# Patient Record
Sex: Female | Born: 1960 | Race: Black or African American | Hispanic: No | Smoking: Never smoker
Health system: Southern US, Community
[De-identification: ages and names within clinical notes are randomized; demographics above are authoritative.]

## PROBLEM LIST (undated history)

## (undated) DIAGNOSIS — I10 Essential (primary) hypertension: Secondary | ICD-10-CM

## (undated) DIAGNOSIS — I509 Heart failure, unspecified: Secondary | ICD-10-CM

## (undated) HISTORY — DX: Heart failure, unspecified: I50.9

## (undated) HISTORY — PX: BREAST BIOPSY: SHX20

---

## 2016-05-31 ENCOUNTER — Ambulatory Visit: Payer: No Typology Code available for payment source

## 2016-05-31 ENCOUNTER — Emergency Department (HOSPITAL_COMMUNITY): Payer: Self-pay

## 2016-05-31 ENCOUNTER — Inpatient Hospital Stay (HOSPITAL_COMMUNITY)
Admission: EM | Admit: 2016-05-31 | Discharge: 2016-06-06 | DRG: 287 | Disposition: A | Discharge status: Home / Self Care | Payer: PRIVATE HEALTH INSURANCE | Attending: Family Medicine | Admitting: Family Medicine

## 2016-05-31 ENCOUNTER — Encounter (HOSPITAL_COMMUNITY): Payer: Self-pay

## 2016-05-31 ENCOUNTER — Ambulatory Visit (INDEPENDENT_AMBULATORY_CARE_PROVIDER_SITE_OTHER): Payer: No Typology Code available for payment source | Admitting: Physician Assistant

## 2016-05-31 VITALS — BP 180/140 | HR 116 | Temp 98.2°F | Resp 17 | Ht 61.0 in | Wt 156.0 lb

## 2016-05-31 DIAGNOSIS — I5041 Acute combined systolic (congestive) and diastolic (congestive) heart failure: Secondary | ICD-10-CM | POA: Diagnosis present

## 2016-05-31 DIAGNOSIS — R9431 Abnormal electrocardiogram [ECG] [EKG]: Secondary | ICD-10-CM | POA: Diagnosis not present

## 2016-05-31 DIAGNOSIS — R030 Elevated blood-pressure reading, without diagnosis of hypertension: Secondary | ICD-10-CM | POA: Diagnosis not present

## 2016-05-31 DIAGNOSIS — I160 Hypertensive urgency: Secondary | ICD-10-CM | POA: Diagnosis present

## 2016-05-31 DIAGNOSIS — I509 Heart failure, unspecified: Secondary | ICD-10-CM

## 2016-05-31 DIAGNOSIS — Z809 Family history of malignant neoplasm, unspecified: Secondary | ICD-10-CM

## 2016-05-31 DIAGNOSIS — R0600 Dyspnea, unspecified: Secondary | ICD-10-CM

## 2016-05-31 DIAGNOSIS — R7302 Impaired glucose tolerance (oral): Secondary | ICD-10-CM

## 2016-05-31 DIAGNOSIS — R0602 Shortness of breath: Secondary | ICD-10-CM | POA: Diagnosis not present

## 2016-05-31 DIAGNOSIS — R000 Tachycardia, unspecified: Secondary | ICD-10-CM | POA: Diagnosis present

## 2016-05-31 DIAGNOSIS — R05 Cough: Secondary | ICD-10-CM

## 2016-05-31 DIAGNOSIS — I313 Pericardial effusion (noninflammatory): Secondary | ICD-10-CM | POA: Diagnosis present

## 2016-05-31 DIAGNOSIS — I5021 Acute systolic (congestive) heart failure: Secondary | ICD-10-CM

## 2016-05-31 DIAGNOSIS — R0609 Other forms of dyspnea: Secondary | ICD-10-CM

## 2016-05-31 DIAGNOSIS — R059 Cough, unspecified: Secondary | ICD-10-CM

## 2016-05-31 DIAGNOSIS — I110 Hypertensive heart disease with heart failure: Principal | ICD-10-CM | POA: Diagnosis present

## 2016-05-31 DIAGNOSIS — I2510 Atherosclerotic heart disease of native coronary artery without angina pectoris: Secondary | ICD-10-CM | POA: Diagnosis present

## 2016-05-31 DIAGNOSIS — I10 Essential (primary) hypertension: Secondary | ICD-10-CM

## 2016-05-31 DIAGNOSIS — D509 Iron deficiency anemia, unspecified: Secondary | ICD-10-CM | POA: Diagnosis present

## 2016-05-31 DIAGNOSIS — Z8249 Family history of ischemic heart disease and other diseases of the circulatory system: Secondary | ICD-10-CM

## 2016-05-31 DIAGNOSIS — Z833 Family history of diabetes mellitus: Secondary | ICD-10-CM

## 2016-05-31 HISTORY — DX: Essential (primary) hypertension: I10

## 2016-05-31 HISTORY — DX: Heart failure, unspecified: I50.9

## 2016-05-31 LAB — CBC
HCT: 38.1 % (ref 36.0–46.0)
Hemoglobin: 11.9 g/dL — ABNORMAL LOW (ref 12.0–15.0)
MCH: 21 pg — ABNORMAL LOW (ref 26.0–34.0)
MCHC: 31.2 g/dL (ref 30.0–36.0)
MCV: 67.3 fL — ABNORMAL LOW (ref 78.0–100.0)
Platelets: 271 10*3/uL (ref 150–400)
RBC: 5.66 MIL/uL — ABNORMAL HIGH (ref 3.87–5.11)
RDW: 16.1 % — ABNORMAL HIGH (ref 11.5–15.5)
WBC: 8.2 10*3/uL (ref 4.0–10.5)

## 2016-05-31 LAB — POCT CBC
Granulocyte percent: 65.5 %G (ref 37–80)
HCT, POC: 37.3 % — AB (ref 37.7–47.9)
Hemoglobin: 12.2 g/dL (ref 12.2–16.2)
Lymph, poc: 2.4 (ref 0.6–3.4)
MCH, POC: 22 pg — AB (ref 27–31.2)
MCHC: 32.8 g/dL (ref 31.8–35.4)
MCV: 67 fL — AB (ref 80–97)
MID (cbc): 0.6 (ref 0–0.9)
MPV: 8.7 fL (ref 0–99.8)
POC Granulocyte: 5.6 (ref 2–6.9)
POC LYMPH PERCENT: 27.7 %L (ref 10–50)
POC MID %: 6.8 %M (ref 0–12)
Platelet Count, POC: 307 10*3/uL (ref 142–424)
RBC: 5.56 M/uL — AB (ref 4.04–5.48)
RDW, POC: 16.4 %
WBC: 8.5 10*3/uL (ref 4.6–10.2)

## 2016-05-31 LAB — BASIC METABOLIC PANEL
Anion gap: 10 (ref 5–15)
BUN: 11 mg/dL (ref 6–20)
CO2: 23 mmol/L (ref 22–32)
Calcium: 9.1 mg/dL (ref 8.9–10.3)
Chloride: 106 mmol/L (ref 101–111)
Creatinine, Ser: 0.82 mg/dL (ref 0.44–1.00)
GFR calc Af Amer: >60 mL/min (ref >60)
GFR calc non Af Amer: >60 mL/min (ref >60)
Glucose, Bld: 95 mg/dL (ref 65–99)
Potassium: 3.8 mmol/L (ref 3.5–5.1)
Sodium: 139 mmol/L (ref 135–145)

## 2016-05-31 LAB — BRAIN NATRIURETIC PEPTIDE: B Natriuretic Peptide: 731.4 pg/mL — ABNORMAL HIGH (ref 0.0–100.0)

## 2016-05-31 LAB — I-STAT TROPONIN, ED: Troponin i, poc: 0.02 ng/mL (ref 0.00–0.08)

## 2016-05-31 LAB — D-DIMER, QUANTITATIVE: D-Dimer, Quant: 1.48 ug/mL-FEU — ABNORMAL HIGH (ref 0.00–0.50)

## 2016-05-31 MED ORDER — IOPAMIDOL (ISOVUE-370) INJECTION 76%
INTRAVENOUS | Status: AC
  Administered 2016-05-31: 70 mL
  Filled 2016-05-31: qty 100

## 2016-05-31 MED ORDER — ASPIRIN 81 MG PO CHEW
324.0000 mg | CHEWABLE_TABLET | Freq: Once | ORAL | Status: DC
  Administered 2016-05-31: 324 mg via ORAL

## 2016-05-31 NOTE — ED Triage Notes (Signed)
Patient here for urgent care for shortness of breath for past 2 weeks.  Patient had elevated BP and HR for EMS.  Last BP was 219/98 HR was 124.  Patient A&Ox4 no acute distress noted.

## 2016-05-31 NOTE — ED Provider Notes (Signed)
Presents with nonproductive cough and dyspnea worse with exertion improved with rest onset 2 weeks ago, gradually, becoming worse over the past one or 2 days. She denies chest pain. Denies orthopnea. No treatment prior to coming here. No other associated symptoms on exam no distress HEENT exam no facial asymmetry neck supple positive JVD lungs Rales at right side posteriorly 1 [address] up. Heart regular rate and rhythm abdomen nontender extremities without edema   Doug Sou, MD 06/01/16 0010

## 2016-05-31 NOTE — Progress Notes (Signed)
Christine Wilson  MRN: [id number] DOB: [birth year]  Subjective:  Christine Wilson is a 55 y.o. female seen in office today for a chief complaint of non productive cough, SOB, and fatigue x 2 weeks. Has associated wheezing. She is also having trouble lying directly on her back but can sleep on her side with one pillow.  Denies any recent illness, pleuritic or exertional chest pain, recent leg swelling, immobilization, hormone therapy, malignancy, and hospitalization.   She had a diagnosis of htn 12 years ago and was on medication for a little bit but notes it was too strong for her so she weaned herself off of it.     Pt does not smoke. She has family history of heart disease. Her mother passed away from an MI at age 57.   Review of Systems  Constitutional: Negative for chills, diaphoresis and fatigue.  HENT: Negative for congestion, ear pain and sinus pressure.   Eyes: Negative for pain.  Respiratory: Positive for wheezing.   Cardiovascular: Negative for chest pain, palpitations and leg swelling.  Genitourinary: Negative for hematuria.  Neurological: Negative for dizziness, weakness, light-headedness, numbness and headaches.    There are no active problems to display for this patient.   No current outpatient prescriptions on file prior to visit.   No current facility-administered medications on file prior to visit.     No Known Allergies     History reviewed. No pertinent past medical history.  Family History  Problem Relation Age of Onset  . Diabetes Mother   . Hypertension Mother   . Heart disease Mother 6565    passed away due to MI  . Hypertension Father   . Diabetes Father   . Diabetes Brother   . Cancer Maternal Grandmother   . Hypertension Maternal Grandmother    Social History   Social History  . Marital status: Single    Spouse name: N/A  . Number of children: N/A  . Years of education: N/A   Occupational History  . Not on file.   Social History Main Topics   . Smoking status: Never Smoker  . Smokeless tobacco: Never Used  . Alcohol use No  . Drug use: No  . Sexual activity: Not on file   Other Topics Concern  . Not on file   Social History Narrative  . No narrative on file    Objective:  BP (!) 180/140 (BP Location: Left Arm, Cuff Size: Normal)   Pulse (!) 116   Temp 98.2 F (36.8 C) (Oral)   Resp 17   Ht 5\' 1"  (1.549 m)   Wt 156 lb (70.8 kg)   SpO2 99%   BMI 29.48 kg/m   Physical Exam  Constitutional: She is oriented to person, place, and time. She appears distressed (mild).  HENT:  Head: Normocephalic and atraumatic.  Eyes: Conjunctivae are normal.  Neck: Normal range of motion.  Cardiovascular: Regular rhythm, normal heart sounds and intact distal pulses.  Tachycardia present.   Pulmonary/Chest: Effort normal. She has rales in the right lower field.  Musculoskeletal:       Right lower leg: Normal. She exhibits no swelling.       Left lower leg: Normal. She exhibits no swelling.  Neurological: She is alert and oriented to person, place, and time. Gait normal.  Skin: Skin is warm and dry.  Psychiatric: Affect normal.  Vitals reviewed.    Results for orders placed or performed in visit on 05/31/16 (from the past 24 hour(s))  POCT CBC     Status: Abnormal   Collection Time: 05/31/16  5:35 PM  Result Value Ref Range   WBC 8.5 4.6 - 10.2 K/uL   Lymph, poc 2.4 0.6 - 3.4   POC LYMPH PERCENT 27.7 10 - 50 %L   MID (cbc) 0.6 0 - 0.9   POC MID % 6.8 0 - 12 %M   POC Granulocyte 5.6 2 - 6.9   Granulocyte percent 65.5 37 - 80 %G   RBC 5.56 (A) 4.04 - 5.48 M/uL   Hemoglobin 12.2 12.2 - 16.2 g/dL   HCT, POC 16.1 (A) 09.6 - 47.9 %   MCV 67.0 (A) 80 - 97 fL   MCH, POC 22.0 (A) 27 - 31.2 pg   MCHC 32.8 31.8 - 35.4 g/dL   RDW, POC 04.5 %   Platelet Count, POC 307 142 - 424 K/uL   MPV 8.7 0 - 99.8 fL   EKG shows tachycardia at 113 bpm with ST segment depression in leads V5 and V6. There was atrial enlargement in the inferior  leads. Large depolarization in septal leads (V1-V3). There is no prior EKG for comparison. EKG findings presented to Dr. Gwendolyn Grant.   Assessment and Plan :  This case was precepted with Dr. Gwendolyn Grant. In favor of expediting pt's care, the workup was held here once EKG was done. EMS was contacted. Patient was transferred to [facility] ER for further evaluation and management of care.  1. Cough - EKG 12-Lead - DG Chest 2 View; Future  2. Shortness of breath - EKG 12-Lead - DG Chest 2 View; Future  3. Elevated blood pressure reading - POCT CBC - COMPLETE METABOLIC PANEL WITH GFR - TSH - Lipid panel  4. Abnormal EKG  - aspirin chewable tablet 324 mg; Chew 4 tablets (324 mg total) by mouth once.  Benjiman Core PA-C  [facility] 05/31/2016 6:11 PM

## 2016-05-31 NOTE — ED Provider Notes (Signed)
[facility]-EMERGENCY DEPT Provider Note   CSN: [id number] Arrival date & time: 05/31/16  1841     History   Chief Complaint Chief Complaint  Patient presents with  . Shortness of Breath    HPI Christine Wilson is a 55 y.o. female.  HPI   55 year old female with history of hypertension and here from urgent care for evaluation of shortness of breath. Patient reports gradual onset of shortness of breath and nonproductive cough ongoing for the past 2-3 weeks. Shortness of breath is more noticeable with exertion especially if she walks from the parking lot to the apartment on the second floor. She reported having tightness in her chest. Report mild congestion. She also complaining of bilateral leg cramping for the same duration. She denies having associated fever, headache, lightheadedness, chest pain, nausea, vomiting, diarrhea, diaphoresis, back pain or abdominal pain. She denies any increased fluid gain. She is a nonsmoker and no history of diabetes. She was diagnosed with hypertension 13 years ago but does not take any blood pressure medication. She denies any recent sick contact. She denies any prior history of PE or DVT, no recent surgery, prolonged bed rest, unilateral leg swelling or calf pain, active cancer or hemoptysis. She is not on any oral hormone. She does not have a parenchymal provider. She has been using over-the-counter medication such as Mucinex, vitamin C and E, garlic by mouth, as well as potassium supplement. These medications have not provided any relief. Otherwise she does not take any medication on a regular basis. Patient sleeps with one pillow.  Past Medical History:  Diagnosis Date  . Hypertension     There are no active problems to display for this patient.   Past Surgical History:  Procedure Laterality Date  . CESAREAN SECTION      OB History    No data available       Home Medications    Prior to Admission medications   Medication Sig Start Date End Date  Taking? Authorizing Provider  BIOTIN FORTE PO Take by mouth daily.    Historical Provider, MD  cholecalciferol (VITAMIN D) 1000 units tablet Take 1,000 Units by mouth daily.    Historical Provider, MD  Garlic 10 MG CAPS Take by mouth daily.    Historical Provider, MD  Multiple Vitamins-Minerals (MULTIVITAMIN WITH MINERALS) tablet Take 1 tablet by mouth daily.    Historical Provider, MD  vitamin E 100 UNIT capsule Take by mouth daily.    Historical Provider, MD    Family History Family History  Problem Relation Age of Onset  . Diabetes Mother   . Hypertension Mother   . Heart disease Mother 92    passed away due to MI  . Hypertension Father   . Diabetes Father   . Diabetes Brother   . Cancer Maternal Grandmother   . Hypertension Maternal Grandmother     Social History Social History  Substance Use Topics  . Smoking status: Never Smoker  . Smokeless tobacco: Never Used  . Alcohol use No     Allergies   Patient has no known allergies.   Review of Systems Review of Systems  All other systems reviewed and are negative.    Physical Exam Updated Vital Signs There were no vitals taken for this visit.  Physical Exam  Constitutional: She is oriented to person, place, and time. She appears well-developed and well-nourished. No distress.  HENT:  Head: Atraumatic.  Eyes: Conjunctivae are normal.  Neck: Neck supple. No JVD present.  Cardiovascular: Intact distal pulses.  Exam reveals no gallop and no friction rub.   No murmur heard. Tachycardia without murmurs or gallops  Pulmonary/Chest: She has rales (Rales heard at base of lungs. No wheezes or rhonchi).  Abdominal: Soft. There is no tenderness.  Musculoskeletal: She exhibits no edema.  Bilateral lower extremities are without palpable cords, erythema, or edema.  Neurological: She is alert and oriented to person, place, and time. She has normal strength. No cranial nerve deficit or sensory deficit. GCS eye subscore is 4.  GCS verbal subscore is 5. GCS motor subscore is 6.  Skin: No rash noted.  Psychiatric: She has a normal mood and affect.  Nursing note and vitals reviewed.    ED Treatments / Results  Labs (all labs ordered are listed, but only abnormal results are displayed) Labs Reviewed - No data to display  EKG  EKG Interpretation  Date/Time:  Thursday May 31 2016 19:16:04 EST Ventricular Rate:  111 PR Interval:    QRS Duration: 90 QT Interval:  313 QTC Calculation: 426 R Axis:   53 Text Interpretation:  Sinus tachycardia Biatrial enlargement LVH with secondary repolarization abnormality No old tracing to compare Confirmed by Ethelda ChickJACUBOWITZ  MD, SAM [phone]([id number]) on 05/31/2016 7:24:34 PM       Radiology Dg Chest 2 View  Result Date: 05/31/2016 CLINICAL DATA:  Cough and dyspnea for 2 weeks EXAM: CHEST  2 VIEW COMPARISON:  None. FINDINGS: The heart is enlarged. There is aortic atherosclerosis. There is mild diffuse interstitial prominence with minimal peribronchial thickening suggestive of small airway inflammation/ bronchitic change. No pneumonic consolidation, effusion or pulmonary edema. No suspicious osseous lesions. IMPRESSION: Findings suggest mild bronchitic change of the lungs without pneumonic consolidation, CHF, effusion or pneumothorax. Electronically Signed   By: Tollie Ethavid  Kwon M.D.   On: 05/31/2016 20:03    Procedures Procedures (including critical care time)  Medications Ordered in ED Medications - No data to display   Initial Impression / Assessment and Plan / ED Course  I have reviewed the triage vital signs and the nursing notes.  Pertinent labs & imaging results that were available during my care of the patient were reviewed by me and considered in my medical decision making (see chart for details).  Clinical Course     BP (!) 196/108   Pulse 112   Resp 25   SpO2 92% Comment: While ambulating   Final Clinical Impressions(s) / ED Diagnoses   Final diagnoses:  None     New Prescriptions New Prescriptions   No medications on file   7:11 PM Patient here with non-productive cough and shortness of breath. She was found to be hypertensive with systolic blood pressure 220. She is tachycardic with heart rates in the 1 teens. She has crackles in the lung base. She does not have JVD or peripheral edema. She is not on any ACE inhibitor. Workup initiated. Care discussed with Dr. Ethelda ChickJacubowitz.   8:09 PM Sign out to oncoming provider who will f/u on pt's labs, manage BP and determine disposition.    Fayrene HelperBowie Luther Newhouse, PA-C 05/31/16 2016    Doug SouSam Jacubowitz, MD 06/01/16 0010

## 2016-06-01 ENCOUNTER — Observation Stay (HOSPITAL_COMMUNITY): Payer: Self-pay

## 2016-06-01 ENCOUNTER — Encounter (HOSPITAL_COMMUNITY): Payer: Self-pay | Admitting: Family Medicine

## 2016-06-01 DIAGNOSIS — R0609 Other forms of dyspnea: Secondary | ICD-10-CM

## 2016-06-01 DIAGNOSIS — I509 Heart failure, unspecified: Secondary | ICD-10-CM | POA: Diagnosis not present

## 2016-06-01 DIAGNOSIS — R0600 Dyspnea, unspecified: Secondary | ICD-10-CM

## 2016-06-01 DIAGNOSIS — I160 Hypertensive urgency: Secondary | ICD-10-CM | POA: Diagnosis not present

## 2016-06-01 DIAGNOSIS — I10 Essential (primary) hypertension: Secondary | ICD-10-CM | POA: Diagnosis not present

## 2016-06-01 DIAGNOSIS — D509 Iron deficiency anemia, unspecified: Secondary | ICD-10-CM | POA: Diagnosis not present

## 2016-06-01 DIAGNOSIS — R7302 Impaired glucose tolerance (oral): Secondary | ICD-10-CM | POA: Diagnosis not present

## 2016-06-01 DIAGNOSIS — I5041 Acute combined systolic (congestive) and diastolic (congestive) heart failure: Secondary | ICD-10-CM | POA: Diagnosis not present

## 2016-06-01 HISTORY — DX: Other forms of dyspnea: R06.09

## 2016-06-01 LAB — BASIC METABOLIC PANEL
Anion gap: 12 (ref 5–15)
BUN: 11 mg/dL (ref 6–20)
CO2: 27 mmol/L (ref 22–32)
Calcium: 9.6 mg/dL (ref 8.9–10.3)
Chloride: 102 mmol/L (ref 101–111)
Creatinine, Ser: 0.97 mg/dL (ref 0.44–1.00)
GFR calc Af Amer: >60 mL/min (ref >60)
GFR calc non Af Amer: >60 mL/min (ref >60)
Glucose, Bld: 148 mg/dL — ABNORMAL HIGH (ref 65–99)
Potassium: 3.7 mmol/L (ref 3.5–5.1)
Sodium: 141 mmol/L (ref 135–145)

## 2016-06-01 LAB — LIPID PANEL
Cholesterol: 173 mg/dL (ref <200)
HDL: 54 mg/dL (ref >50)
LDL Cholesterol: 83 mg/dL
Total CHOL/HDL Ratio: 3.2 Ratio (ref <5.0)
Triglycerides: 181 mg/dL — ABNORMAL HIGH (ref <150)
VLDL: 36 mg/dL — ABNORMAL HIGH (ref <30)

## 2016-06-01 LAB — COMPLETE METABOLIC PANEL WITH GFR
ALT: 52 U/L — ABNORMAL HIGH (ref 6–29)
AST: 40 U/L — ABNORMAL HIGH (ref 10–35)
Albumin: 4.2 g/dL (ref 3.6–5.1)
Alkaline Phosphatase: 77 U/L (ref 33–130)
BUN: 13 mg/dL (ref 7–25)
CO2: 24 mmol/L (ref 20–31)
Calcium: 9.4 mg/dL (ref 8.6–10.4)
Chloride: 106 mmol/L (ref 98–110)
Creat: 0.79 mg/dL (ref 0.50–1.05)
GFR, Est African American: >89 mL/min (ref >=60)
GFR, Est Non African American: 84 mL/min (ref >=60)
Glucose, Bld: 123 mg/dL — ABNORMAL HIGH (ref 65–99)
Potassium: 4.2 mmol/L (ref 3.5–5.3)
Sodium: 141 mmol/L (ref 135–146)
Total Bilirubin: 0.8 mg/dL (ref 0.2–1.2)
Total Protein: 7.1 g/dL (ref 6.1–8.1)

## 2016-06-01 LAB — IRON AND TIBC
Iron: 76 ug/dL (ref 28–170)
Saturation Ratios: 14 % (ref 10.4–31.8)
TIBC: 525 ug/dL — ABNORMAL HIGH (ref 250–450)
UIBC: 449 ug/dL

## 2016-06-01 LAB — FERRITIN: Ferritin: 85 ng/mL (ref 11–307)

## 2016-06-01 LAB — MAGNESIUM: Magnesium: 1.9 mg/dL (ref 1.7–2.4)

## 2016-06-01 LAB — TSH: TSH: 2.67 mIU/L

## 2016-06-01 MED ORDER — POTASSIUM CHLORIDE CRYS ER 20 MEQ PO TBCR
20.0000 meq | EXTENDED_RELEASE_TABLET | Freq: Every day | ORAL | Status: DC
  Administered 2016-06-02 – 2016-06-03 (×2): 20 meq via ORAL
  Filled 2016-06-01 (×2): qty 1

## 2016-06-01 MED ORDER — NITROGLYCERIN 2 % TD OINT
1.0000 [in_us] | TOPICAL_OINTMENT | Freq: Once | TRANSDERMAL | Status: AC
  Administered 2016-06-01: 1 [in_us] via TOPICAL
  Filled 2016-06-01: qty 1

## 2016-06-01 MED ORDER — ONDANSETRON HCL 4 MG/2ML IJ SOLN: 4.0000 mg | Freq: Four times a day (QID) | INTRAMUSCULAR | Status: DC | PRN

## 2016-06-01 MED ORDER — ENOXAPARIN SODIUM 40 MG/0.4ML ~~LOC~~ SOLN
40.0000 mg | SUBCUTANEOUS | Status: DC
  Administered 2016-06-01 – 2016-06-03 (×3): 40 mg via SUBCUTANEOUS
  Filled 2016-06-01 (×3): qty 0.4

## 2016-06-01 MED ORDER — FUROSEMIDE 10 MG/ML IJ SOLN
40.0000 mg | Freq: Two times a day (BID) | INTRAMUSCULAR | Status: DC
  Administered 2016-06-01 – 2016-06-03 (×5): 40 mg via INTRAVENOUS
  Filled 2016-06-01 (×5): qty 4

## 2016-06-01 MED ORDER — ACETAMINOPHEN 325 MG PO TABS: 650.0000 mg | ORAL_TABLET | ORAL | Status: DC | PRN

## 2016-06-01 MED ORDER — AMLODIPINE BESYLATE 5 MG PO TABS
5.0000 mg | ORAL_TABLET | Freq: Every day | ORAL | Status: DC
  Administered 2016-06-01 – 2016-06-02 (×2): 5 mg via ORAL
  Filled 2016-06-01 (×2): qty 1

## 2016-06-01 MED ORDER — SODIUM CHLORIDE 0.9% FLUSH: 3.0000 mL | INTRAVENOUS | Status: DC | PRN

## 2016-06-01 MED ORDER — ADULT MULTIVITAMIN W/MINERALS CH
1.0000 | ORAL_TABLET | Freq: Every day | ORAL | Status: DC
  Administered 2016-06-01 – 2016-06-06 (×5): 1 via ORAL
  Filled 2016-06-01 (×5): qty 1

## 2016-06-01 MED ORDER — CARVEDILOL 3.125 MG PO TABS: 3.1250 mg | ORAL_TABLET | Freq: Two times a day (BID) | ORAL | Status: DC

## 2016-06-01 MED ORDER — POTASSIUM CHLORIDE CRYS ER 20 MEQ PO TBCR
20.0000 meq | EXTENDED_RELEASE_TABLET | Freq: Once | ORAL | Status: AC
  Administered 2016-06-01: 20 meq via ORAL
  Filled 2016-06-01: qty 1

## 2016-06-01 MED ORDER — SODIUM CHLORIDE 0.9 % IV SOLN
250.0000 mL | INTRAVENOUS | Status: DC | PRN
  Administered 2016-06-01: 250 mL via INTRAVENOUS

## 2016-06-01 MED ORDER — CARVEDILOL 6.25 MG PO TABS
6.2500 mg | ORAL_TABLET | Freq: Two times a day (BID) | ORAL | Status: DC
  Administered 2016-06-01: 6.25 mg via ORAL
  Filled 2016-06-01: qty 1

## 2016-06-01 MED ORDER — ALPRAZOLAM 0.25 MG PO TABS: 0.2500 mg | ORAL_TABLET | Freq: Two times a day (BID) | ORAL | Status: DC | PRN

## 2016-06-01 MED ORDER — HYDRALAZINE HCL 20 MG/ML IJ SOLN
5.0000 mg | INTRAMUSCULAR | Status: DC | PRN
  Administered 2016-06-01: 5 mg via INTRAVENOUS
  Filled 2016-06-01: qty 1

## 2016-06-01 MED ORDER — FUROSEMIDE 10 MG/ML IJ SOLN
40.0000 mg | INTRAMUSCULAR | Status: AC
  Administered 2016-06-01: 40 mg via INTRAVENOUS
  Filled 2016-06-01: qty 4

## 2016-06-01 MED ORDER — HYDRALAZINE HCL 20 MG/ML IJ SOLN
10.0000 mg | INTRAMUSCULAR | Status: DC | PRN
  Administered 2016-06-01: 10 mg via INTRAVENOUS
  Filled 2016-06-01: qty 1

## 2016-06-01 MED ORDER — SODIUM CHLORIDE 0.9 % IV SOLN
125.0000 mg | Freq: Once | INTRAVENOUS | Status: AC
  Administered 2016-06-01: 125 mg via INTRAVENOUS
  Filled 2016-06-01: qty 10

## 2016-06-01 MED ORDER — SODIUM CHLORIDE 0.9% FLUSH
3.0000 mL | Freq: Two times a day (BID) | INTRAVENOUS | Status: DC
  Administered 2016-06-01 – 2016-06-05 (×9): 3 mL via INTRAVENOUS

## 2016-06-01 MED ORDER — VITAMIN D 1000 UNITS PO TABS
1000.0000 [IU] | ORAL_TABLET | Freq: Every day | ORAL | Status: DC
  Administered 2016-06-01 – 2016-06-06 (×5): 1000 [IU] via ORAL
  Filled 2016-06-01 (×5): qty 1

## 2016-06-01 MED ORDER — FERROUS SULFATE 325 (65 FE) MG PO TABS: 325.0000 mg | ORAL_TABLET | Freq: Every day | ORAL | Status: DC

## 2016-06-01 MED ORDER — POTASSIUM CHLORIDE CRYS ER 10 MEQ PO TBCR
10.0000 meq | EXTENDED_RELEASE_TABLET | Freq: Two times a day (BID) | ORAL | Status: DC
  Administered 2016-06-01 (×2): 10 meq via ORAL
  Filled 2016-06-01 (×2): qty 1

## 2016-06-01 NOTE — Care Management Note (Signed)
Case Management Note  Patient Details  Name: [name] MRN: [id number] Date of Birth: 1960-10-26  Subjective/Objective:          Admitted with CHF          Action/Plan: Patient moved from [place], [place] to [place] 3 yrs ago; No PCP; has private insurance with Ambulance person with prescription drug plan; pharmacy of choice is [facility]. Patient is independent of her ADL's, works full time.  She has scales at home, CM instructed pt to weigh herself daily. She eats a heart healthy diet, CM talked to the patient at length about monitoring her sodium in her diet. Follow up apt made with [facility] for primary care with Alysia Penna NP for Jun 29, 2016 at 11:30 am. CM will continue to follow for DCP.  Expected Discharge Date:  06/02/16               Expected Discharge Plan:  Home/Self Care   Discharge planning Services  CM Consult, Follow-up appt scheduled     Status of Service:  In process, will continue to follow  Reola Mosher [phone] 06/01/2016, 10:58 AM

## 2016-06-01 NOTE — Progress Notes (Signed)
B/P = 208/120, Pulse = 125 this afternoon.  Patient given 1st doses of Coreg 6.25mg  po and Norvasc 5 mg po now.  Dose of Lasix 40mg  IV given as scheduled.  VSs reviewed with Dr. Arbutus Leas.  Will recheck B/P for effectiveness of new heart medications this afternoon.

## 2016-06-01 NOTE — Plan of Care (Signed)
Problem: Cardiac: Goal: Ability to achieve and maintain adequate cardiopulmonary perfusion will improve Outcome: Progressing 2D Echo ordered  Problem: Education: Goal: Ability to demonstrate managment of disease process will improve Outcome: Progressing "Living Better with Heart Failure" booklet given and reviewed.  Discussed booklet information with patient's daughter and nephew per patient's request.  Patient saw HF video this morning.

## 2016-06-01 NOTE — Progress Notes (Signed)
Patient's B/P 1 hour after new heart medications = 152/88 and pulse = 94

## 2016-06-01 NOTE — ED Provider Notes (Signed)
12:10 AM Patient care assumed from Fayrene HelperBowie Tran, PA-C at shift change. Patient presenting for progressive 2 weeks of shortness of breath. She has been off of antihypertensive medications for 13 years and reports not seeing a primary care physician for the last 5 years. Patient is afebrile and without leukocytosis. Low suspicion for infectious etiology. A d-dimer was ordered which was elevated. Subsequent CT scan shows no evidence of pulmonary embolus; however, there is evidence of cardiac enlargement with pleural effusions and edema consistent with CHF. Elevated BNP supports diagnosis of acute CHF exacerbation. Patient has remained hypertensive while in the emergency department. Most recent blood pressure 205/120. Nitro paste has been ordered as well as an initial dose of Lasix. Results have been reviewed with the patient in their entirety. She is amenable to admission. Will consult with [facility] for admission and further management, likely echo in the morning.  12:30 AM Case discussed with Dr. Antionette Charpyd who will admit. Temporary admission orders placed.   Results for orders placed or performed during the hospital encounter of 05/31/16  Basic metabolic panel  Result Value Ref Range   Sodium 139 135 - 145 mmol/L   Potassium 3.8 3.5 - 5.1 mmol/L   Chloride 106 101 - 111 mmol/L   CO2 23 22 - 32 mmol/L   Glucose, Bld 95 65 - 99 mg/dL   BUN 11 6 - 20 mg/dL   Creatinine, Ser 6.570.82 0.44 - 1.00 mg/dL   Calcium 9.1 8.9 - 84.610.3 mg/dL   GFR calc non Af Amer >60 >60 mL/min   GFR calc Af Amer >60 >60 mL/min   Anion gap 10 5 - 15  CBC  Result Value Ref Range   WBC 8.2 4.0 - 10.5 K/uL   RBC 5.66 (H) 3.87 - 5.11 MIL/uL   Hemoglobin 11.9 (L) 12.0 - 15.0 g/dL   HCT 96.238.1 95.236.0 - 84.146.0 %   MCV 67.3 (L) 78.0 - 100.0 fL   MCH 21.0 (L) 26.0 - 34.0 pg   MCHC 31.2 30.0 - 36.0 g/dL   RDW 32.416.1 (H) 40.111.5 - 02.715.5 %   Platelets 271 150 - 400 K/uL  D-[facility]er, quantitative ([facility])  Result Value Ref Range   D-Dimer, Quant 1.48  (H) 0.00 - 0.50 ug/mL-FEU  Brain natriuretic peptide  Result Value Ref Range   B Natriuretic Peptide 731.4 (H) 0.0 - 100.0 pg/mL  I-stat troponin, ED  Result Value Ref Range   Troponin i, poc 0.02 0.00 - 0.08 ng/mL   Comment 3           Dg Chest 2 View  Result Date: 05/31/2016 CLINICAL DATA:  Cough and dyspnea for 2 weeks EXAM: CHEST  2 VIEW COMPARISON:  None. FINDINGS: The heart is enlarged. There is aortic atherosclerosis. There is mild diffuse interstitial prominence with minimal peribronchial thickening suggestive of small airway inflammation/ bronchitic change. No pneumonic consolidation, effusion or pulmonary edema. No suspicious osseous lesions. IMPRESSION: Findings suggest mild bronchitic change of the lungs without pneumonic consolidation, CHF, effusion or pneumothorax. Electronically Signed   By: Tollie Ethavid  Kwon M.D.   On: 05/31/2016 20:03   Ct Angio Chest Pe W And/or Wo Contrast  Result Date: 05/31/2016 CLINICAL DATA:  Shortness of breath for 3 weeks. No chest pain. Nonsmoker. EXAM: CT ANGIOGRAPHY CHEST WITH CONTRAST TECHNIQUE: Multidetector CT imaging of the chest was performed using the standard protocol during bolus administration of intravenous contrast. Multiplanar CT image reconstructions and MIPs were obtained to evaluate the vascular anatomy. CONTRAST:  70 mL Isovue 370 COMPARISON:  None. FINDINGS: Cardiovascular: Technically adequate study with good opacification of the central and segmental pulmonary arteries. No focal filling defects. No evidence of significant pulmonary embolus. Diffuse cardiac enlargement. Small pericardial effusion. Normal caliber thoracic aorta. Mediastinum/Nodes: Mediastinal lymph nodes are mildly prominent diffusely without pathologic enlargement, likely reactive. Esophagus is decompressed. Small esophageal hiatal hernia. Lungs/Pleura: Small bilateral pleural effusions with basilar atelectasis, greater on the right. Diffuse interstitial pattern to the lungs  consistent with interstitial edema. Diffuse bronchial wall thickening consistent with acute or chronic bronchitis. Airways are patent. No pneumothorax. Upper Abdomen: No acute abnormality. Musculoskeletal: Degenerative changes in the spine. No destructive bone lesions. Review of the MIP images confirms the above findings. IMPRESSION: No evidence of significant pulmonary embolus. Cardiac enlargement with bilateral pleural effusions and diffuse interstitial edema consistent with congestive failure. Bronchial wall thickening may indicate acute or chronic bronchitis. Electronically Signed   By: Burman Nieves M.D.   On: 05/31/2016 23:56      Antony Madura, PA-C 06/01/16 0031    Doug Sou, MD 06/01/16 [phone]

## 2016-06-01 NOTE — Consult Note (Signed)
Cardiology Consult    Patient ID: Suhaila Troiano MRN: [id number], DOB/AGE: November 07, 1960   Admit date: 05/31/2016 Date of Consult: 06/01/2016  Primary Physician: No PCP Per Patient Reason for Consult: CHF Primary Cardiologist: New - Dr. Excell Seltzer Requesting Provider: Dr. Arbutus Leas   History of Present Illness    Darothy Courtright is a 55 y.o. female with past medical history of HTN who initially presented to Urgent Care on 05/31/2016 for evaluation of a dry cough, dyspnea, and fatigue for the past two weeks who was referred to [facility] for further treatment.  The patient reports having worsening dyspnea with exertion and a dry cough for the past 3-4 weeks. Notes this when walking from her car to the house or climbing stairs. Denies any associated chest discomfort. Does feel occasional palpitations when her breathing is labored. Reports an associated dry cough as well. Denies any orthopnea, PND, or lower extremity edema.   She has not been seen by a PCP in 5+ years. Denies any history of HTN, HLD, or Type 2 DM. No personal cardiac history. Her mother did have CAD and passed away from a massive MI at age 57. No tobacco use, alcohol use, or recreational drug use.   Initial BP was found to be elevated to 205/120. Labs show a WBC of 8.2, Hgb 11.9, platelets 271. K+ 3.8. Creatinine 0.82. D-dimer 1.48. Initial troponin negative. BNP 731. CXR showing mild bronchitic changes of the lungs without consolidation, CHF, or effusion. CTA without evidence of significant pulmonary embolus. Cardiac enlargement with bilateral pleural effusions and diffuse interstitial edema consistent with congestive failure was noted on this study. EKG shows sinus tachycardia, HR 111, with ST depression in lateral leads likely secondary to repolarization abnormality in the setting of LVH.   Has been started on IV Lasix 40mg  BID with a net output of -1.3L thus far. Has been started on Coreg 3.125mg  BID along with receiving PRN IV  Hydralazine with BP remaining elevated to 179/87 on the most recent check.    Past Medical History   Past Medical History:  Diagnosis Date  . Hypertension     Past Surgical History:  Procedure Laterality Date  . CESAREAN SECTION       Allergies  No Known Allergies  Inpatient Medications    . carvedilol  3.125 mg Oral BID WC  . cholecalciferol  1,000 Units Oral Daily  . enoxaparin (LOVENOX) injection  40 mg Subcutaneous Q24H  . [START ON 06/02/2016] ferrous sulfate  325 mg Oral Q breakfast  . furosemide  40 mg Intravenous BID  . multivitamin with minerals  1 tablet Oral Daily  . [START ON 06/02/2016] potassium chloride  20 mEq Oral Daily  . sodium chloride flush  3 mL Intravenous Q12H    Family History    Family History  Problem Relation Age of Onset  . Diabetes Mother   . Hypertension Mother   . Heart disease Mother 3    a. Passed away due to MI  . Hypertension Father   . Diabetes Father   . Diabetes Brother   . Cancer Maternal Grandmother   . Hypertension Maternal Grandmother     Social History    Social History   Social History  . Marital status: Divorced    Spouse name: N/A  . Number of children: N/A  . Years of education: N/A   Occupational History  . Not on file.   Social History Main Topics  . Smoking status: Never Smoker  .  Smokeless tobacco: Never Used  . Alcohol use No  . Drug use: No  . Sexual activity: Not on file   Other Topics Concern  . Not on file   Social History Narrative  . No narrative on file     Review of Systems    General:  No chills, fever, night sweats or weight changes.  Cardiovascular:  No chest pain, edema, orthopnea,  paroxysmal nocturnal dyspnea.  Positive for dyspnea with exertion and palpitations.  Dermatological: No rash, lesions/masses Respiratory: Positive for dry cough and dyspnea. Urologic: No hematuria, dysuria Abdominal:   No nausea, vomiting, diarrhea, bright red blood per rectum, melena, or  hematemesis Neurologic:  No visual changes, wkns, changes in mental status. All other systems reviewed and are otherwise negative except as noted above.  Physical Exam    Blood pressure (!) 179/87, pulse 100, temperature 98.6 F (37 C), temperature source Oral, resp. rate (!) 24, height 5\' 1"  (1.549 m), weight 147 lb 14.4 oz (67.1 kg), SpO2 99 %.  General: Pleasant, African American female appearing in NAD Psych: Normal affect. Neuro: Alert and oriented X 3. Moves all extremities spontaneously. HEENT: Normal  Neck: Supple without bruits. JVD at 9cm. Lungs:  Resp regular and unlabored, decreased breath sounds at bases bilaterally. Heart: Regular rhythm, tachycardiac rate, no s3, s4, or murmurs. Abdomen: Soft, non-tender, non-distended, BS + x 4.  Extremities: No clubbing, cyanosis or edema. DP/PT/Radials 2+ and equal bilaterally.  Labs    Troponin [facility](Point of Care Test)  Recent Labs  05/31/16 2005  TROPIPOC 0.02   No results for input(s): CKTOTAL, CKMB, TROPONINI in the last 72 hours. Lab Results  Component Value Date   WBC 8.2 05/31/2016   HGB 11.9 (L) 05/31/2016   HCT 38.1 05/31/2016   MCV 67.3 (L) 05/31/2016   PLT 271 05/31/2016    Recent Labs Lab 05/31/16 1726  06/01/16 0229  NA 141  < > 141  K 4.2  < > 3.7  CL 106  < > 102  CO2 24  < > 27  BUN 13  < > 11  CREATININE 0.79  < > 0.97  CALCIUM 9.4  < > 9.6  PROT 7.1  --   --   BILITOT 0.8  --   --   ALKPHOS 77  --   --   ALT 52*  --   --   AST 40*  --   --   GLUCOSE 123*  < > 148*  < > = values in this interval not displayed. Lab Results  Component Value Date   CHOL 173 05/31/2016   HDL 54 05/31/2016   LDLCALC 83 05/31/2016   TRIG 181 (H) 05/31/2016   Lab Results  Component Value Date   DDIMER 1.48 (H) 05/31/2016     Radiology Studies    Dg Chest 2 View  Result Date: 05/31/2016 CLINICAL DATA:  Cough and dyspnea for 2 weeks EXAM: CHEST  2 VIEW COMPARISON:  None. FINDINGS: The heart is enlarged. There  is aortic atherosclerosis. There is mild diffuse interstitial prominence with minimal peribronchial thickening suggestive of small airway inflammation/ bronchitic change. No pneumonic consolidation, effusion or pulmonary edema. No suspicious osseous lesions. IMPRESSION: Findings suggest mild bronchitic change of the lungs without pneumonic consolidation, CHF, effusion or pneumothorax. Electronically Signed   By: Tollie Ethavid  Kwon M.D.   On: 05/31/2016 20:03   Ct Angio Chest Pe W And/or Wo Contrast  Result Date: 05/31/2016 CLINICAL DATA:  Shortness of breath  for 3 weeks. No chest pain. Nonsmoker. EXAM: CT ANGIOGRAPHY CHEST WITH CONTRAST TECHNIQUE: Multidetector CT imaging of the chest was performed using the standard protocol during bolus administration of intravenous contrast. Multiplanar CT image reconstructions and MIPs were obtained to evaluate the vascular anatomy. CONTRAST:  70 mL Isovue 370 COMPARISON:  None. FINDINGS: Cardiovascular: Technically adequate study with good opacification of the central and segmental pulmonary arteries. No focal filling defects. No evidence of significant pulmonary embolus. Diffuse cardiac enlargement. Small pericardial effusion. Normal caliber thoracic aorta. Mediastinum/Nodes: Mediastinal lymph nodes are mildly prominent diffusely without pathologic enlargement, likely reactive. Esophagus is decompressed. Small esophageal hiatal hernia. Lungs/Pleura: Small bilateral pleural effusions with basilar atelectasis, greater on the right. Diffuse interstitial pattern to the lungs consistent with interstitial edema. Diffuse bronchial wall thickening consistent with acute or chronic bronchitis. Airways are patent. No pneumothorax. Upper Abdomen: No acute abnormality. Musculoskeletal: Degenerative changes in the spine. No destructive bone lesions. Review of the MIP images confirms the above findings. IMPRESSION: No evidence of significant pulmonary embolus. Cardiac enlargement with bilateral  pleural effusions and diffuse interstitial edema consistent with congestive failure. Bronchial wall thickening may indicate acute or chronic bronchitis. Electronically Signed   By: Burman Nieves M.D.   On: 05/31/2016 23:56    EKG & Cardiac Imaging    EKG: Sinus tachycardia, HR 111, with ST depression in lateral leads likely secondary to repolarization abnormality in the setting of LVH. No prior tracings available for comparison.  Echocardiogram: Pending  Assessment & Plan    1. Acute CHF Exacerbation/ Dyspnea with Exertion - reports progressive dyspnea with exertion, a dry cough, and palpitations for the past 3-4 weeks. Denies any chest pain, PND, orthopnea, or lower extremity edema. No prior cardiac history but does have a family history of CAD with her mother having a fatal MI at age 31. Lipid Panel shows total cholesterol 173 and LDL 83. - BNP elevated to 694. CTA without evidence of significant pulmonary embolus but cardiac enlargement with bilateral pleural effusions and diffuse interstitial edema was noted. EKG shows sinus tachycardia, HR 111, with ST depression in lateral leads likely secondary to repolarization abnormality in the setting of LVH.  - started on IV Lasix 40mg  BID with net output -1.3L. Still has rales on exam, therefore will continue with IV Lasix. Has been started on BB therapy. - echocardiogram is pending to assess LV function and wall motion. Consider ischemic evaluation if dyspnea continues despite diuresis or if EF is significantly reduced.   2. Hypertensive Urgency - Initial BP was found to be elevated to 205/120. Started on Coreg 3.125mg  BID and receiving PRN Hydralazine with current BP of 179/87. - will titrate Coreg to 6.25mg  BID. Will add Amlodipine 5mg  daily with titration to 10mg  daily if needed.   3. Sinus Tachycardia - HR remaining elevated into the 110's - 120's. - started on Coreg 3.125mg  BID. Will titrate to 6.25mg  BID.   Signed, Ellsworth Lennox,  PA-C 06/01/2016, 2:33 PM Pager: [phone]  Patient seen, examined. Available data reviewed. Agree with findings, assessment, and plan as outlined by Randall An, PA-C. On examination, the patient is alert and oriented, pleasant woman in no distress. JVP is mildly elevated. Lung fields are clear. Heart is tachycardic and regular without murmur or gallop. The apex is discrete and nondisplaced. The abdomen is soft and nontender without organomegaly. Lower extremities show no edema. The skin is warm and dry with no rash. Neurologic is grossly intact with normal strength bilaterally. EKG shows  sinus tachycardia with LVH and associated repolarization abnormality. Echocardiogram is currently pending. I suspect the patient has acute diastolic heart failure based on her clinical presentation, lab and radiographic results, and uncontrolled hypertension. I agree that increasing carvedilol, adding amlodipine, and continuing with a loop diuretic is appropriate. If she does have LV dysfunction, she should be started on an ACE or ARB rather than amlodipine. Will await her echo results. She is counseled regarding sodium restriction, medication compliance, and control of her blood pressure. Will follow-up once her echo is done.  Tonny Bollman, M.D. 06/01/2016 3:36 PM

## 2016-06-01 NOTE — CV Procedure (Signed)
2 D echo attempted but consult in room  

## 2016-06-01 NOTE — H&P (Addendum)
History and Physical    Christine Wilson ZHY:[id number] DOB: 26-Jun-1961 DOA: 05/31/2016  PCP: No PCP Per Patient   Patient coming from: Home, by way of urgent care   Chief Complaint: SOB, non-productive cough, orthopnea   HPI: Christine Wilson is a 55 y.o. female with medical history significant for hypertension who stopped taking medications for this almost 15 years ago and now presents to the ED with 2 weeks of progressive dyspnea, nonproductive cough, and fatigue. Patient reports that she typically enjoys good health but has not seen a physician in at least 5 years. She had been in her usual state until the insidious development of dyspnea on exertion with nonproductive cough and fatigue for approximately 2 weeks ago. Over the ensuing days, her condition is continued to worsen progressively and she has become dyspneic with minimal exertion. Patient is also noted that she is unable to lie on her back due to worsening in her shortness of breath. She denies any swelling of the feet or legs and denies any chest pain or palpitations. She denies any history of heart disease and is a nonsmoker. There has been no recent fevers or chills, on distance travel, or sick contacts.     ED Course: Upon arrival to the ED, patient is found to be afebrile, saturating adequately on room air, tachypneic, tachycardic in the 110s, and hypertensive to the 200/110 range. EKG features a sinus tachycardia with rate 111 and LVH by voltage criteria with secondary repolarization abnormality. Chest x-ray is notable for mild bronchitic changes only. Chemistry panel is unremarkable and CBC features a hemoglobin of 11.9 with MCV of 67.3. Troponin is within the normal limits, BNP is elevated to 731, and d-dimer is elevated to 1.48. CTA PE study was performed and negative for PE, but notable for cardiomegaly with bilateral pleural effusions and diffuse interstitial edema consistent with CHF. Patient was treated with 40 mg of IV Lasix and 1  inch of nitroglycerin paste was applied. She has remained hypertensive, but in no acute respiratory distress and without chest pain. She will be observed on the telemetry unit for ongoing evaluation and management of dyspnea with orthopnea, elevated BNP, and pulmonary edema on imaging consistent with acute CHF.  Review of Systems:  All other systems reviewed and apart from HPI, are negative.  Past Medical History:  Diagnosis Date  . Hypertension     Past Surgical History:  Procedure Laterality Date  . CESAREAN SECTION       reports that she has never smoked. She has never used smokeless tobacco. She reports that she does not drink alcohol or use drugs.  No Known Allergies  Family History  Problem Relation Age of Onset  . Diabetes Mother   . Hypertension Mother   . Heart disease Mother 62    passed away due to MI  . Hypertension Father   . Diabetes Father   . Diabetes Brother   . Cancer Maternal Grandmother   . Hypertension Maternal Grandmother      Prior to Admission medications   Medication Sig Start Date End Date Taking? Authorizing Provider  BIOTIN FORTE PO Take 1 tablet by mouth daily as needed (takes occassionaly).    Yes Historical Provider, MD  cholecalciferol (VITAMIN D) 1000 units tablet Take 1,000 Units by mouth daily.   Yes Historical Provider, MD  Garlic 10 MG CAPS Take 10 mg by mouth daily.    Yes Historical Provider, MD  Multiple Vitamins-Minerals (MULTIVITAMIN WITH MINERALS) tablet Take 1 tablet  by mouth daily.   Yes Historical Provider, MD  Potassium (POTASSIMIN PO) Take 1 tablet by mouth daily.   Yes Historical Provider, MD  vitamin E 100 UNIT capsule Take by mouth daily.   Yes Historical Provider, MD    Physical Exam: Vitals:   05/31/16 2130 05/31/16 2145 05/31/16 2315 06/01/16 0045  BP: (!) 195/132 (!) 199/127 (!) 205/120 (!) 197/119  Pulse: 109 108 109 108  Resp:   (!) 29 26  SpO2: 98% 99% 98% 99%      Constitutional: NAD, calm,  comfortable Eyes: PERTLA, lids and conjunctivae normal ENMT: Mucous membranes are moist. Posterior pharynx clear of any exudate or lesions.   Neck: normal, supple, no masses, no thyromegaly Respiratory: Diminished in bases, crackles at bilateral mid-lung zones. Normal respiratory effort. No accessory muscle use.  Cardiovascular: S1 & S2 heard, regular rate and rhythm. No extremity edema. Minimal NVD. Abdomen: No distension, no tenderness, no masses palpated. Bowel sounds normal.  Musculoskeletal: no clubbing / cyanosis. No joint deformity upper and lower extremities. Normal muscle tone.  Skin: no significant rashes, lesions, ulcers. Warm, dry, well-perfused. Neurologic: CN 2-12 grossly intact. Sensation intact, DTR normal. Strength 5/5 in all 4 limbs.  Psychiatric: Normal judgment and insight. Alert and oriented x 3. Normal mood and affect.     Labs on Admission: I have personally reviewed following labs and imaging studies  CBC:  Recent Labs Lab 05/31/16 1735 05/31/16 2003  WBC 8.5 8.2  HGB 12.2 11.9*  HCT 37.3* 38.1  MCV 67.0* 67.3*  PLT  --  271   Basic Metabolic Panel:  Recent Labs Lab 05/31/16 2003  NA 139  K 3.8  CL 106  CO2 23  GLUCOSE 95  BUN 11  CREATININE 0.82  CALCIUM 9.1   GFR: Estimated Creatinine Clearance: 68.1 mL/min (by C-G formula based on SCr of 0.82 mg/dL). Liver Function Tests: No results for input(s): AST, ALT, ALKPHOS, BILITOT, PROT, ALBUMIN in the last 168 hours. No results for input(s): LIPASE, AMYLASE in the last 168 hours. No results for input(s): AMMONIA in the last 168 hours. Coagulation Profile: No results for input(s): INR, PROTIME in the last 168 hours. Cardiac Enzymes: No results for input(s): CKTOTAL, CKMB, CKMBINDEX, TROPONINI in the last 168 hours. BNP (last 3 results) No results for input(s): PROBNP in the last 8760 hours. HbA1C: No results for input(s): HGBA1C in the last 72 hours. CBG: No results for input(s): GLUCAP in  the last 168 hours. Lipid Profile: No results for input(s): CHOL, HDL, LDLCALC, TRIG, CHOLHDL, LDLDIRECT in the last 72 hours. Thyroid Function Tests: No results for input(s): TSH, T4TOTAL, FREET4, T3FREE, THYROIDAB in the last 72 hours. Anemia Panel: No results for input(s): VITAMINB12, FOLATE, FERRITIN, TIBC, IRON, RETICCTPCT in the last 72 hours. Urine analysis: No results found for: COLORURINE, APPEARANCEUR, LABSPEC, PHURINE, GLUCOSEU, HGBUR, BILIRUBINUR, KETONESUR, PROTEINUR, UROBILINOGEN, NITRITE, LEUKOCYTESUR Sepsis Labs: @LABRCNTIP (procalcitonin:4,lacticidven:4) )No results found for this or any previous visit (from the past 240 hour(s)).   Radiological Exams on Admission: Dg Chest 2 View  Result Date: 05/31/2016 CLINICAL DATA:  Cough and dyspnea for 2 weeks EXAM: CHEST  2 VIEW COMPARISON:  None. FINDINGS: The heart is enlarged. There is aortic atherosclerosis. There is mild diffuse interstitial prominence with minimal peribronchial thickening suggestive of small airway inflammation/ bronchitic change. No pneumonic consolidation, effusion or pulmonary edema. No suspicious osseous lesions. IMPRESSION: Findings suggest mild bronchitic change of the lungs without pneumonic consolidation, CHF, effusion or pneumothorax. Electronically Signed  By: Tollie Eth M.D.   On: 05/31/2016 20:03   Ct Angio Chest Pe W And/or Wo Contrast  Result Date: 05/31/2016 CLINICAL DATA:  Shortness of breath for 3 weeks. No chest pain. Nonsmoker. EXAM: CT ANGIOGRAPHY CHEST WITH CONTRAST TECHNIQUE: Multidetector CT imaging of the chest was performed using the standard protocol during bolus administration of intravenous contrast. Multiplanar CT image reconstructions and MIPs were obtained to evaluate the vascular anatomy. CONTRAST:  70 mL Isovue 370 COMPARISON:  None. FINDINGS: Cardiovascular: Technically adequate study with good opacification of the central and segmental pulmonary arteries. No focal filling defects.  No evidence of significant pulmonary embolus. Diffuse cardiac enlargement. Small pericardial effusion. Normal caliber thoracic aorta. Mediastinum/Nodes: Mediastinal lymph nodes are mildly prominent diffusely without pathologic enlargement, likely reactive. Esophagus is decompressed. Small esophageal hiatal hernia. Lungs/Pleura: Small bilateral pleural effusions with basilar atelectasis, greater on the right. Diffuse interstitial pattern to the lungs consistent with interstitial edema. Diffuse bronchial wall thickening consistent with acute or chronic bronchitis. Airways are patent. No pneumothorax. Upper Abdomen: No acute abnormality. Musculoskeletal: Degenerative changes in the spine. No destructive bone lesions. Review of the MIP images confirms the above findings. IMPRESSION: No evidence of significant pulmonary embolus. Cardiac enlargement with bilateral pleural effusions and diffuse interstitial edema consistent with congestive failure. Bronchial wall thickening may indicate acute or chronic bronchitis. Electronically Signed   By: Burman Nieves M.D.   On: 05/31/2016 23:56    EKG: Independently reviewed. Sinus tachycardia (rate 111), LVH by voltage criteria with secondary repolarization abnormality.   Assessment/Plan  1. Acute CHF  - Pt presents with progressive exertional dyspnea and orthopnea, found to be hypertensive with elevated BNP and diffuse interstitial edema on imaging  - There is no hx of CAD and there has been no anginal sxs; suspect this is secondary to uncontrolled HTN  - She has been treated in the ED with nitro paste and Lasix 40 mg IV  - Plan to observe on telemetry, continue diuresis with IV Lasix, obtain TTE  - Follow daily wts, strict I/O, and daily chem panel; SLIV and fluid-restrict diet    2. Hypertension with hypertensive urgency  - BP elevated to 200/110 range on arrival in setting of acute CHF  - She had previously been on antihypertensives, but reports stopping them  13 yrs ago  - She is being treated with nitropaste, Lasix, and prn hydralazine IVPs for now - Anticipate improvement with diuresis; will likely need to be restarted on antihypertensives   3. Microcytic anemia  - Hgb 11.9 with MCV 67.3; there are no priors available for comparison  - She is post-menopausal and denies melena or hematochezia  - Will check iron-studies; could also be a mild thalassemia or spherocytosis    DVT prophylaxis: sq Lovenox  Code Status: Full  Family Communication: Discussed with patient Disposition Plan: Observe on telemetry Consults called: None Admission status: Observation    Briscoe Deutscher, MD [facility] Pager [phone]  If 7PM-7AM, please contact night-coverage [email or web address] Password [email or web address]  06/01/2016, 1:04 AM

## 2016-06-01 NOTE — Progress Notes (Signed)
Patient's b/p = 183/93. Dr. Arbutus Leas consulted and Hydralazine 5 mg IV given per modified prn order.

## 2016-06-01 NOTE — Progress Notes (Addendum)
PROGRESS NOTE  Christine Wilson WUJ:[id number] DOB: August 27, 1960 DOA: 05/31/2016 PCP: No PCP Per Patient  Brief History:  55 year old female with a history of hypertension presented with two-week history of dyspnea on exertion, shortness of breath, and nonproductive cough. The patient states that over the past 2 days her shortness of breath has worsened to the point of having dyspnea with minimal exertion. She also started having orthopnea type symptoms.  She states that she has not seen a physician for 5 years, and she stopped taking her antihypertensive medications 15 years ago. She denies any chest pain, dizziness, nausea, vomiting, diarrhea. She denies any fevers, chills, sick contacts. Upon presentation, the patient was found to have a blood pressure of 205/120. CT angiogram of chest was performed which was negative for pulmonary abuse but showed interstitial edema with bilateral small pleural effusions. The patient was started on intravenous furosemide with good clinical effect.  Assessment/Plan: Acute CHF -no significant peripheral edema, but neck veins still up -consult cardiology-->?good Entresto candidate (she has good renal function and is HTN) -continue IV Lasix -daily weights -Echo -06/01/16 CTA chest--interstitial edema, sm bilat pleural effusion, diffuse bronchial wall thickening; small pericardial effusion, Neg PE  HTN -start carvedilol -may be Entresto candidate--defer to cardiology  Microcytic anemia -she has component of iron deficiency but indices suggest anemia of chronic illness -Nulecit x 1, then start oral iron -Iron saturation 14%, ferritin 85  Leg pain -venous duplex r/o DVT  Hyperglycemia -check A1C    Disposition Plan:   Home in 1-2 days  Family Communication:  No Family at bedside--Total time spent 35 minutes.  Greater than 50% spent face to face counseling and coordinating care.  [id number] [phone]  Consultants:  Cardiology  Code Status:  FULL    DVT Prophylaxis:   [place] Lovenox   Procedures: As Listed in Progress Note Above  Antibiotics: None    Subjective: C/o bilateral leg cramps.  Patient denies fevers, chills, headache, chest pain, dyspnea, nausea, vomiting, diarrhea, abdominal pain, dysuria, hematuria, hematochezia, and melena.   Objective: Vitals:   06/01/16 0151 06/01/16 0309 06/01/16 0403 06/01/16 0748  BP: (!) 191/100 (!) 178/82 (!) 142/80 (!) 178/96  Pulse: (!) 103 (!) 108 100 (!) 103  Resp: (!) 24  (!) 22 (!) 24  Temp: 98.5 F (36.9 C)  98.3 F (36.8 C) 98.6 F (37 C)  TempSrc: Oral  Oral Oral  SpO2: 100%  100% 99%  Weight: 67.1 kg (147 lb 14.4 oz)     Height: 5\' 1"  (1.549 m)       Intake/Output Summary (Last 24 hours) at 06/01/16 1001 Last data filed at 06/01/16 [id number]  Gross per 24 hour  Intake              360 ml  Output             1350 ml  Net             -990 ml   Weight change:  Exam:   General:  Pt is alert, follows commands appropriately, not in acute distress  HEENT: No icterus, No thrush, No neck mass, [place]/AT  Cardiovascular: RRR, S1/S2, no rubs, no gallops+JVD  Respiratory: bibasilar crackles, no wheeze  Abdomen: Soft/+BS, non tender, non distended, no guarding  Extremities: No edema, No lymphangitis, No petechiae, No rashes, no synovitis   Data Reviewed: I have personally reviewed following labs and imaging studies Basic Metabolic Panel:  Recent  Labs Lab 05/31/16 2003 06/01/16 0229  NA 139 141  K 3.8 3.7  CL 106 102  CO2 23 27  GLUCOSE 95 148*  BUN 11 11  CREATININE 0.82 0.97  CALCIUM 9.1 9.6  MG  --  1.9   Liver Function Tests: No results for input(s): AST, ALT, ALKPHOS, BILITOT, PROT, ALBUMIN in the last 168 hours. No results for input(s): LIPASE, AMYLASE in the last 168 hours. No results for input(s): AMMONIA in the last 168 hours. Coagulation Profile: No results for input(s): INR, PROTIME in the last 168 hours. CBC:  Recent Labs Lab 05/31/16 1735  05/31/16 2003  WBC 8.5 8.2  HGB 12.2 11.9*  HCT 37.3* 38.1  MCV 67.0* 67.3*  PLT  --  271   Cardiac Enzymes: No results for input(s): CKTOTAL, CKMB, CKMBINDEX, TROPONINI in the last 168 hours. BNP: Invalid input(s): POCBNP CBG: No results for input(s): GLUCAP in the last 168 hours. HbA1C: No results for input(s): HGBA1C in the last 72 hours. Urine analysis: No results found for: COLORURINE, APPEARANCEUR, LABSPEC, PHURINE, GLUCOSEU, HGBUR, BILIRUBINUR, KETONESUR, PROTEINUR, UROBILINOGEN, NITRITE, LEUKOCYTESUR Sepsis Labs: @LABRCNTIP (procalcitonin:4,lacticidven:4) )No results found for this or any previous visit (from the past 240 hour(s)).   Scheduled Meds: . cholecalciferol  1,000 Units Oral Daily  . enoxaparin (LOVENOX) injection  40 mg Subcutaneous Q24H  . furosemide  40 mg Intravenous BID  . multivitamin with minerals  1 tablet Oral Daily  . potassium chloride  10 mEq Oral BID  . sodium chloride flush  3 mL Intravenous Q12H   Continuous Infusions:  Procedures/Studies: Dg Chest 2 View  Result Date: 05/31/2016 CLINICAL DATA:  Cough and dyspnea for 2 weeks EXAM: CHEST  2 VIEW COMPARISON:  None. FINDINGS: The heart is enlarged. There is aortic atherosclerosis. There is mild diffuse interstitial prominence with minimal peribronchial thickening suggestive of small airway inflammation/ bronchitic change. No pneumonic consolidation, effusion or pulmonary edema. No suspicious osseous lesions. IMPRESSION: Findings suggest mild bronchitic change of the lungs without pneumonic consolidation, CHF, effusion or pneumothorax. Electronically Signed   By: Tollie Eth M.D.   On: 05/31/2016 20:03   Ct Angio Chest Pe W And/or Wo Contrast  Result Date: 05/31/2016 CLINICAL DATA:  Shortness of breath for 3 weeks. No chest pain. Nonsmoker. EXAM: CT ANGIOGRAPHY CHEST WITH CONTRAST TECHNIQUE: Multidetector CT imaging of the chest was performed using the standard protocol during bolus administration of  intravenous contrast. Multiplanar CT image reconstructions and MIPs were obtained to evaluate the vascular anatomy. CONTRAST:  70 mL Isovue 370 COMPARISON:  None. FINDINGS: Cardiovascular: Technically adequate study with good opacification of the central and segmental pulmonary arteries. No focal filling defects. No evidence of significant pulmonary embolus. Diffuse cardiac enlargement. Small pericardial effusion. Normal caliber thoracic aorta. Mediastinum/Nodes: Mediastinal lymph nodes are mildly prominent diffusely without pathologic enlargement, likely reactive. Esophagus is decompressed. Small esophageal hiatal hernia. Lungs/Pleura: Small bilateral pleural effusions with basilar atelectasis, greater on the right. Diffuse interstitial pattern to the lungs consistent with interstitial edema. Diffuse bronchial wall thickening consistent with acute or chronic bronchitis. Airways are patent. No pneumothorax. Upper Abdomen: No acute abnormality. Musculoskeletal: Degenerative changes in the spine. No destructive bone lesions. Review of the MIP images confirms the above findings. IMPRESSION: No evidence of significant pulmonary embolus. Cardiac enlargement with bilateral pleural effusions and diffuse interstitial edema consistent with congestive failure. Bronchial wall thickening may indicate acute or chronic bronchitis. Electronically Signed   By: Burman Nieves M.D.   On: 05/31/2016 23:56  Rita Prom, DO  [facility] Pager [phone]  If 7PM-7AM, please contact night-coverage [email or web address] Password [email or web address] 06/01/2016, 10:01 AM   LOS: 0 days

## 2016-06-02 ENCOUNTER — Observation Stay (HOSPITAL_COMMUNITY): Payer: PRIVATE HEALTH INSURANCE

## 2016-06-02 ENCOUNTER — Observation Stay (HOSPITAL_BASED_OUTPATIENT_CLINIC_OR_DEPARTMENT_OTHER): Payer: Self-pay

## 2016-06-02 DIAGNOSIS — I5031 Acute diastolic (congestive) heart failure: Secondary | ICD-10-CM

## 2016-06-02 DIAGNOSIS — I160 Hypertensive urgency: Secondary | ICD-10-CM | POA: Diagnosis not present

## 2016-06-02 DIAGNOSIS — I5041 Acute combined systolic (congestive) and diastolic (congestive) heart failure: Secondary | ICD-10-CM | POA: Diagnosis not present

## 2016-06-02 DIAGNOSIS — I5021 Acute systolic (congestive) heart failure: Secondary | ICD-10-CM

## 2016-06-02 DIAGNOSIS — I509 Heart failure, unspecified: Secondary | ICD-10-CM

## 2016-06-02 DIAGNOSIS — D509 Iron deficiency anemia, unspecified: Secondary | ICD-10-CM | POA: Diagnosis not present

## 2016-06-02 DIAGNOSIS — M79609 Pain in unspecified limb: Secondary | ICD-10-CM | POA: Diagnosis not present

## 2016-06-02 LAB — BASIC METABOLIC PANEL
Anion gap: 13 (ref 5–15)
BUN: 18 mg/dL (ref 6–20)
CO2: 25 mmol/L (ref 22–32)
Calcium: 9.5 mg/dL (ref 8.9–10.3)
Chloride: 105 mmol/L (ref 101–111)
Creatinine, Ser: 1.03 mg/dL — ABNORMAL HIGH (ref 0.44–1.00)
GFR calc Af Amer: >60 mL/min (ref >60)
GFR calc non Af Amer: 59 mL/min — ABNORMAL LOW (ref >60)
Glucose, Bld: 106 mg/dL — ABNORMAL HIGH (ref 65–99)
Potassium: 3.7 mmol/L (ref 3.5–5.1)
Sodium: 143 mmol/L (ref 135–145)

## 2016-06-02 LAB — MAGNESIUM: Magnesium: 2.2 mg/dL (ref 1.7–2.4)

## 2016-06-02 LAB — ECHOCARDIOGRAM COMPLETE
Height: 61 in
Weight: 2353.6 oz

## 2016-06-02 MED ORDER — FERROUS SULFATE 325 (65 FE) MG PO TABS
325.0000 mg | ORAL_TABLET | Freq: Every day | ORAL | Status: DC
  Administered 2016-06-02 – 2016-06-06 (×4): 325 mg via ORAL
  Filled 2016-06-02 (×4): qty 1

## 2016-06-02 MED ORDER — CARVEDILOL 6.25 MG PO TABS
6.2500 mg | ORAL_TABLET | Freq: Two times a day (BID) | ORAL | Status: DC
  Administered 2016-06-02 – 2016-06-03 (×4): 6.25 mg via ORAL
  Filled 2016-06-02 (×4): qty 1

## 2016-06-02 MED ORDER — LOSARTAN POTASSIUM 50 MG PO TABS
50.0000 mg | ORAL_TABLET | Freq: Every day | ORAL | Status: DC
  Administered 2016-06-02 – 2016-06-03 (×2): 50 mg via ORAL
  Filled 2016-06-02 (×2): qty 1

## 2016-06-02 NOTE — Progress Notes (Signed)
  Echocardiogram 2D Echocardiogram has been performed.  Marisue Humble 06/02/2016, 3:41 PM

## 2016-06-02 NOTE — Progress Notes (Signed)
Patient ID: Jaisa Mille, female   DOB: [birth year], 55 y.o.   MRN: [id number]   Patient Name: Christine Wilson Date of Encounter: 06/02/2016  Primary Cardiologist: Lynnae January Problem List     Principal Problem:   Acute CHF (congestive heart failure) (HCC) Active Problems:   Microcytic anemia   Hypertensive urgency   Dyspnea on exertion     Subjective   No dyspnea feeling better   Inpatient Medications    Scheduled Meds: . amLODipine  5 mg Oral Daily  . carvedilol  6.25 mg Oral BID WC  . cholecalciferol  1,000 Units Oral Daily  . enoxaparin (LOVENOX) injection  40 mg Subcutaneous Q24H  . ferrous sulfate  325 mg Oral Q breakfast  . furosemide  40 mg Intravenous BID  . multivitamin with minerals  1 tablet Oral Daily  . potassium chloride  20 mEq Oral Daily  . sodium chloride flush  3 mL Intravenous Q12H   Continuous Infusions:  PRN Meds: sodium chloride, acetaminophen, ALPRAZolam, hydrALAZINE, ondansetron (ZOFRAN) IV, sodium chloride flush   Vital Signs    Vitals:   06/01/16 2040 06/02/16 0036 06/02/16 0423 06/02/16 0918  BP: (!) 158/88 (!) 154/71 (!) 159/105 (!) 153/129  Pulse: (!) 104  (!) 103 100  Resp: 18  16   Temp: 98.7 F (37.1 C)  98.7 F (37.1 C) 98.8 F (37.1 C)  TempSrc: Oral  Oral Oral  SpO2: 97%   100%  Weight:   66.7 kg (147 lb 1.6 oz)   Height:        Intake/Output Summary (Last 24 hours) at 06/02/16 1109 Last data filed at 06/02/16 8786  Gross per 24 hour  Intake              820 ml  Output             3200 ml  Net            -2380 ml   Filed Weights   06/01/16 0151 06/02/16 0423  Weight: 67.1 kg (147 lb 14.4 oz) 66.7 kg (147 lb 1.6 oz)    Physical Exam    GEN: Black female in no distress .  HEENT: Grossly normal.  Neck: Supple, no JVD, carotid bruits, or masses. Cardiac: RRR, no murmurs, rubs, or gallops. No clubbing, cyanosis, edema.  Radials/DP/PT 2+ and equal bilaterally.  Respiratory:  Respirations regular and  unlabored, clear to auscultation bilaterally. GI: Soft, nontender, nondistended, BS + x 4. MS: no deformity or atrophy. Skin: warm and dry, no rash. Neuro:  Strength and sensation are intact. Psych: AAOx3.  Normal affect.  Labs    CBC  Recent Labs  05/31/16 1735 05/31/16 2003  WBC 8.5 8.2  HGB 12.2 11.9*  HCT 37.3* 38.1  MCV 67.0* 67.3*  PLT  --  271   Basic Metabolic Panel  Recent Labs  06/01/16 0229 06/02/16 0235  NA 141 143  K 3.7 3.7  CL 102 105  CO2 27 25  GLUCOSE 148* 106*  BUN 11 18  CREATININE 0.97 1.03*  CALCIUM 9.6 9.5  MG 1.9 2.2   Liver Function Tests  Recent Labs  05/31/16 1726  AST 40*  ALT 52*  ALKPHOS 77  BILITOT 0.8  PROT 7.1  ALBUMIN 4.2   D-Dimer  Recent Labs  05/31/16 2003  DDIMER 1.48*   Hemoglobin A1C No results for input(s): HGBA1C in the last 72 hours. Fasting Lipid Panel  Recent Labs  05/31/16 1726  CHOL 173  HDL 54  LDLCALC 83  TRIG 181*  CHOLHDL 3.2   Thyroid Function Tests  Recent Labs  05/31/16 1726  TSH 2.67    Telemetry    NSR 06/02/2016  - Personally Reviewed  ECG    [facility]  - Personally Reviewed  Radiology    Dg Chest 2 View  Result Date: 05/31/2016 CLINICAL DATA:  Cough and dyspnea for 2 weeks EXAM: CHEST  2 VIEW COMPARISON:  None. FINDINGS: The heart is enlarged. There is aortic atherosclerosis. There is mild diffuse interstitial prominence with minimal peribronchial thickening suggestive of small airway inflammation/ bronchitic change. No pneumonic consolidation, effusion or pulmonary edema. No suspicious osseous lesions. IMPRESSION: Findings suggest mild bronchitic change of the lungs without pneumonic consolidation, CHF, effusion or pneumothorax. Electronically Signed   By: Tollie Ethavid  Kwon M.D.   On: 05/31/2016 20:03   Ct Angio Chest Pe W And/or Wo Contrast  Result Date: 05/31/2016 CLINICAL DATA:  Shortness of breath for 3 weeks. No chest pain. Nonsmoker. EXAM: CT ANGIOGRAPHY CHEST WITH  CONTRAST TECHNIQUE: Multidetector CT imaging of the chest was performed using the standard protocol during bolus administration of intravenous contrast. Multiplanar CT image reconstructions and MIPs were obtained to evaluate the vascular anatomy. CONTRAST:  70 mL Isovue 370 COMPARISON:  None. FINDINGS: Cardiovascular: Technically adequate study with good opacification of the central and segmental pulmonary arteries. No focal filling defects. No evidence of significant pulmonary embolus. Diffuse cardiac enlargement. Small pericardial effusion. Normal caliber thoracic aorta. Mediastinum/Nodes: Mediastinal lymph nodes are mildly prominent diffusely without pathologic enlargement, likely reactive. Esophagus is decompressed. Small esophageal hiatal hernia. Lungs/Pleura: Small bilateral pleural effusions with basilar atelectasis, greater on the right. Diffuse interstitial pattern to the lungs consistent with interstitial edema. Diffuse bronchial wall thickening consistent with acute or chronic bronchitis. Airways are patent. No pneumothorax. Upper Abdomen: No acute abnormality. Musculoskeletal: Degenerative changes in the spine. No destructive bone lesions. Review of the MIP images confirms the above findings. IMPRESSION: No evidence of significant pulmonary embolus. Cardiac enlargement with bilateral pleural effusions and diffuse interstitial edema consistent with congestive failure. Bronchial wall thickening may indicate acute or chronic bronchitis. Electronically Signed   By: Burman NievesWilliam  Stevens M.D.   On: 05/31/2016 23:56    Cardiac Studies   Echo pending   Patient Profile     55 y.o. admitted with HTN and new CHF. Poor medical f/u echo pending regarding diastolic vs systolic CHF  Assessment & Plan    CHF: See note Dr Excell Seltzerooper d/c amlodipne  Start ARB If EF decreased by echo will arrange right and left cath If diastolic CHF will Rx medications and arrange outpatient f/u Dr Excell Seltzerooper  Signed, Charlton HawsPeter Shalan Neault, MD    06/02/2016, 11:09 AM

## 2016-06-02 NOTE — Progress Notes (Signed)
VASCULAR LAB PRELIMINARY  PRELIMINARY  PRELIMINARY  PRELIMINARY  Bilateral lower extremity venous duplex completed.    Preliminary report:  There is no DVT or SVT noted in the bilateral lower extremities.   Michell Kader, RVT 06/02/2016, 10:13 AM

## 2016-06-02 NOTE — Progress Notes (Signed)
PROGRESS NOTE  Christine Wilson ELF:[id number] DOB: [birth year] DOA: 05/31/2016 PCP: No PCP Per Patient  Brief History:  55 year old female with a history of hypertension presented with two-week history of dyspnea on exertion, shortness of breath, and nonproductive cough. The patient states that over the past 2 days her shortness of breath has worsened to the point of having dyspnea with minimal exertion. She also started having orthopnea type symptoms.  She states that she has not seen a physician for 5 years, and she stopped taking her antihypertensive medications 15 years ago. She denies any chest pain, dizziness, nausea, vomiting, diarrhea. She denies any fevers, chills, sick contacts. Upon presentation, the patient was found to have a blood pressure of 205/120. CT angiogram of chest was performed which was negative for pulmonary abuse but showed interstitial edema with bilateral small pleural effusions. The patient was started on intravenous furosemide with good clinical effect.  Assessment/Plan: Acute systolic and diastolic CHF -no significant peripheral edema, but neck veins still up -appreciate cardiology follow up -continue IV Lasix -NEG 3.5L since admission -Echo--EF 15-20%, grade 1 DD -??Sherryll Burger candidate--defer to cardiology -06/01/16 CTA chest--interstitial edema, sm bilat pleural effusion, diffuse bronchial wall thickening; small pericardial effusion, Neg PE -continue coreg  HTN -continue carvedilol -may be Entresto candidate--defer to cardiology  Microcytic anemia -she has component of iron deficiency but indices suggest anemia of chronic illness -Nulecit x 1, then start oral iron -Iron saturation 14%, ferritin 85  Leg pain -venous duplex r/o DVT--neg -suspect a degree of restless leg related to iron deficiency  Hyperglycemia -check A1C--pending    Disposition Plan:   Home when cleared by cardiology Family Communication:  No Family at  bedside  Consultants:  Cardiology  Code Status:  FULL   DVT Prophylaxis:   [place] Lovenox   Procedures: As Listed in Progress Note Above  Antibiotics: None     Subjective: Patient denies fevers, chills, headache, chest pain, dyspnea, nausea, vomiting, diarrhea, abdominal pain, dysuria, hematuria, hematochezia, and melena.   Objective: Vitals:   06/02/16 0036 06/02/16 0423 06/02/16 0918 06/02/16 1206  BP: (!) 154/71 (!) 159/105 (!) 153/129 138/72  Pulse:  (!) 103 100 99  Resp:  16  18  Temp:  98.7 F (37.1 C) 98.8 F (37.1 C) 98 F (36.7 C)  TempSrc:  Oral Oral Oral  SpO2:   100% 100%  Weight:  66.7 kg (147 lb 1.6 oz)    Height:        Intake/Output Summary (Last 24 hours) at 06/02/16 1705 Last data filed at 06/02/16 1216  Gross per 24 hour  Intake              922 ml  Output             2450 ml  Net            -1528 ml   Weight change: -0.363 kg (-12.8 oz) Exam:   General:  Pt is alert, follows commands appropriately, not in acute distress  HEENT: No icterus, No thrush, No neck mass, [place]/AT  Cardiovascular: RRR, S1/S2, no rubs, no gallops  Respiratory: CTA bilaterally, no wheezing, no crackles, no rhonchi  Abdomen: Soft/+BS, non tender, non distended, no guarding  Extremities: No edema, No lymphangitis, No petechiae, No rashes, no synovitis   Data Reviewed: I have personally reviewed following labs and imaging studies Basic Metabolic Panel:  Recent Labs Lab 05/31/16 1726 05/31/16 2003 06/01/16 0229 06/02/16 0235  NA 141 139 141 143  K 4.2 3.8 3.7 3.7  CL 106 106 102 105  CO2 24 23 27 25   GLUCOSE 123* 95 148* 106*  BUN 13 11 11 18   CREATININE 0.79 0.82 0.97 1.03*  CALCIUM 9.4 9.1 9.6 9.5  MG  --   --  1.9 2.2   Liver Function Tests:  Recent Labs Lab 05/31/16 1726  AST 40*  ALT 52*  ALKPHOS 77  BILITOT 0.8  PROT 7.1  ALBUMIN 4.2   No results for input(s): LIPASE, AMYLASE in the last 168 hours. No results for input(s):  AMMONIA in the last 168 hours. Coagulation Profile: No results for input(s): INR, PROTIME in the last 168 hours. CBC:  Recent Labs Lab 05/31/16 1735 05/31/16 2003  WBC 8.5 8.2  HGB 12.2 11.9*  HCT 37.3* 38.1  MCV 67.0* 67.3*  PLT  --  271   Cardiac Enzymes: No results for input(s): CKTOTAL, CKMB, CKMBINDEX, TROPONINI in the last 168 hours. BNP: Invalid input(s): POCBNP CBG: No results for input(s): GLUCAP in the last 168 hours. HbA1C: No results for input(s): HGBA1C in the last 72 hours. Urine analysis: No results found for: COLORURINE, APPEARANCEUR, LABSPEC, PHURINE, GLUCOSEU, HGBUR, BILIRUBINUR, KETONESUR, PROTEINUR, UROBILINOGEN, NITRITE, LEUKOCYTESUR Sepsis Labs: @LABRCNTIP (procalcitonin:4,lacticidven:4) )No results found for this or any previous visit (from the past 240 hour(s)).   Scheduled Meds: . carvedilol  6.25 mg Oral BID WC  . cholecalciferol  1,000 Units Oral Daily  . enoxaparin (LOVENOX) injection  40 mg Subcutaneous Q24H  . ferrous sulfate  325 mg Oral Q breakfast  . furosemide  40 mg Intravenous BID  . losartan  50 mg Oral Daily  . multivitamin with minerals  1 tablet Oral Daily  . potassium chloride  20 mEq Oral Daily  . sodium chloride flush  3 mL Intravenous Q12H   Continuous Infusions:  Procedures/Studies: Dg Chest 2 View  Result Date: 05/31/2016 CLINICAL DATA:  Cough and dyspnea for 2 weeks EXAM: CHEST  2 VIEW COMPARISON:  None. FINDINGS: The heart is enlarged. There is aortic atherosclerosis. There is mild diffuse interstitial prominence with minimal peribronchial thickening suggestive of small airway inflammation/ bronchitic change. No pneumonic consolidation, effusion or pulmonary edema. No suspicious osseous lesions. IMPRESSION: Findings suggest mild bronchitic change of the lungs without pneumonic consolidation, CHF, effusion or pneumothorax. Electronically Signed   By: Tollie Eth M.D.   On: 05/31/2016 20:03   Ct Angio Chest Pe W And/or Wo  Contrast  Result Date: 05/31/2016 CLINICAL DATA:  Shortness of breath for 3 weeks. No chest pain. Nonsmoker. EXAM: CT ANGIOGRAPHY CHEST WITH CONTRAST TECHNIQUE: Multidetector CT imaging of the chest was performed using the standard protocol during bolus administration of intravenous contrast. Multiplanar CT image reconstructions and MIPs were obtained to evaluate the vascular anatomy. CONTRAST:  70 mL Isovue 370 COMPARISON:  None. FINDINGS: Cardiovascular: Technically adequate study with good opacification of the central and segmental pulmonary arteries. No focal filling defects. No evidence of significant pulmonary embolus. Diffuse cardiac enlargement. Small pericardial effusion. Normal caliber thoracic aorta. Mediastinum/Nodes: Mediastinal lymph nodes are mildly prominent diffusely without pathologic enlargement, likely reactive. Esophagus is decompressed. Small esophageal hiatal hernia. Lungs/Pleura: Small bilateral pleural effusions with basilar atelectasis, greater on the right. Diffuse interstitial pattern to the lungs consistent with interstitial edema. Diffuse bronchial wall thickening consistent with acute or chronic bronchitis. Airways are patent. No pneumothorax. Upper Abdomen: No acute abnormality. Musculoskeletal: Degenerative changes in the spine. No destructive bone lesions. Review of the  MIP images confirms the above findings. IMPRESSION: No evidence of significant pulmonary embolus. Cardiac enlargement with bilateral pleural effusions and diffuse interstitial edema consistent with congestive failure. Bronchial wall thickening may indicate acute or chronic bronchitis. Electronically Signed   By: Burman NievesWilliam  Stevens M.D.   On: 05/31/2016 23:56    Jeromy Borcherding, DO  [facility] Pager [phone]  If 7PM-7AM, please contact night-coverage [email or web address] Password [email or web address] 06/02/2016, 5:05 PM   LOS: 0 days

## 2016-06-03 DIAGNOSIS — R7302 Impaired glucose tolerance (oral): Secondary | ICD-10-CM

## 2016-06-03 DIAGNOSIS — I2510 Atherosclerotic heart disease of native coronary artery without angina pectoris: Secondary | ICD-10-CM | POA: Diagnosis not present

## 2016-06-03 DIAGNOSIS — D509 Iron deficiency anemia, unspecified: Secondary | ICD-10-CM | POA: Diagnosis not present

## 2016-06-03 DIAGNOSIS — I160 Hypertensive urgency: Secondary | ICD-10-CM | POA: Diagnosis not present

## 2016-06-03 DIAGNOSIS — I5021 Acute systolic (congestive) heart failure: Secondary | ICD-10-CM | POA: Diagnosis not present

## 2016-06-03 DIAGNOSIS — I509 Heart failure, unspecified: Secondary | ICD-10-CM | POA: Diagnosis not present

## 2016-06-03 DIAGNOSIS — I5041 Acute combined systolic (congestive) and diastolic (congestive) heart failure: Secondary | ICD-10-CM | POA: Diagnosis not present

## 2016-06-03 LAB — BASIC METABOLIC PANEL
Anion gap: 11 (ref 5–15)
BUN: 20 mg/dL (ref 6–20)
CO2: 27 mmol/L (ref 22–32)
Calcium: 9.6 mg/dL (ref 8.9–10.3)
Chloride: 103 mmol/L (ref 101–111)
Creatinine, Ser: 1.01 mg/dL — ABNORMAL HIGH (ref 0.44–1.00)
GFR calc Af Amer: >60 mL/min (ref >60)
GFR calc non Af Amer: >60 mL/min (ref >60)
Glucose, Bld: 99 mg/dL (ref 65–99)
Potassium: 4.1 mmol/L (ref 3.5–5.1)
Sodium: 141 mmol/L (ref 135–145)

## 2016-06-03 LAB — MAGNESIUM: Magnesium: 2.3 mg/dL (ref 1.7–2.4)

## 2016-06-03 LAB — HEMOGLOBIN A1C
Hgb A1c MFr Bld: 6.3 % — ABNORMAL HIGH (ref 4.8–5.6)
Mean Plasma Glucose: 134 mg/dL

## 2016-06-03 MED ORDER — DIGOXIN 125 MCG PO TABS
0.1250 mg | ORAL_TABLET | Freq: Every day | ORAL | Status: DC
  Administered 2016-06-03: 0.125 mg via ORAL
  Filled 2016-06-03: qty 1

## 2016-06-03 MED ORDER — FUROSEMIDE 40 MG PO TABS: 40.0000 mg | ORAL_TABLET | Freq: Every day | ORAL | Status: DC

## 2016-06-03 MED ORDER — SPIRONOLACTONE 25 MG PO TABS
12.5000 mg | ORAL_TABLET | Freq: Every day | ORAL | Status: DC
  Administered 2016-06-03: 12.5 mg via ORAL
  Filled 2016-06-03: qty 1

## 2016-06-03 MED ORDER — SACUBITRIL-VALSARTAN 24-26 MG PO TABS
1.0000 | ORAL_TABLET | Freq: Two times a day (BID) | ORAL | Status: DC
  Administered 2016-06-03 – 2016-06-04 (×2): 1 via ORAL
  Filled 2016-06-03 (×4): qty 1

## 2016-06-03 NOTE — Progress Notes (Signed)
Advanced Heart Failure Rounding Note   Subjective:    Feels better. Denies, dyspnea, orthopnea or PND.   Echo reviewed personally. EF 15-20% with moderate RV dysfunction.     Objective:   Weight Range:  Vital Signs:   Temp:  [98 F (36.7 C)-98.8 F (37.1 C)] 98.1 F (36.7 C) (11/12 0745) Pulse Rate:  [91-101] 97 (11/12 0745) Resp:  [16-18] 17 (11/12 0745) BP: (123-164)/(72-87) 123/82 (11/12 0745) SpO2:  [100 %] 100 % (11/12 0745) Weight:  [66.5 kg (146 lb 8 oz)] 66.5 kg (146 lb 8 oz) (11/12 0554) Last BM Date: 05/30/16 (pt. states "it was wednessday")  Weight change: Filed Weights   06/01/16 0151 06/02/16 0423 06/03/16 0554  Weight: 67.1 kg (147 lb 14.4 oz) 66.7 kg (147 lb 1.6 oz) 66.5 kg (146 lb 8 oz)    Intake/Output:   Intake/Output Summary (Last 24 hours) at 06/03/16 1055 Last data filed at 06/03/16 0833  Gross per 24 hour  Intake             1062 ml  Output             1450 ml  Net             -388 ml     Physical Exam: General:  Well appearing. No resp difficulty HEENT: normal Neck: supple. JVP 7-8 . Carotids 2+ bilat; no bruits. No lymphadenopathy or thryomegaly appreciated. Cor: PMI nondisplaced. Regular rate & rhythm. 2/6 TR Lungs: clear Abdomen: soft, nontender, nondistended. No hepatosplenomegaly. No bruits or masses. Good bowel sounds. Extremities: no cyanosis, clubbing, rash, edema. Warm  Neuro: alert & orientedx3, cranial nerves grossly intact. moves all 4 extremities w/o difficulty. Affect pleasant  Telemetry: SR 90s  Labs: Basic Metabolic Panel:  Recent Labs Lab 05/31/16 1726 05/31/16 2003 06/01/16 0229 06/02/16 0235 06/03/16 0451  NA 141 139 141 143 141  K 4.2 3.8 3.7 3.7 4.1  CL 106 106 102 105 103  CO2 24 23 27 25 27   GLUCOSE 123* 95 148* 106* 99  BUN 13 11 11 18 20   CREATININE 0.79 0.82 0.97 1.03* 1.01*  CALCIUM 9.4 9.1 9.6 9.5 9.6  MG  --   --  1.9 2.2 2.3    Liver Function Tests:  Recent Labs Lab 05/31/16 1726    AST 40*  ALT 52*  ALKPHOS 77  BILITOT 0.8  PROT 7.1  ALBUMIN 4.2   No results for input(s): LIPASE, AMYLASE in the last 168 hours. No results for input(s): AMMONIA in the last 168 hours.  CBC:  Recent Labs Lab 05/31/16 1735 05/31/16 2003  WBC 8.5 8.2  HGB 12.2 11.9*  HCT 37.3* 38.1  MCV 67.0* 67.3*  PLT  --  271    Cardiac Enzymes: No results for input(s): CKTOTAL, CKMB, CKMBINDEX, TROPONINI in the last 168 hours.  BNP: BNP (last 3 results)  Recent Labs  05/31/16 2003  BNP 731.4*    ProBNP (last 3 results) No results for input(s): PROBNP in the last 8760 hours.    Other results:  Imaging:  No results found.   Medications:     Scheduled Medications: . carvedilol  6.25 mg Oral BID WC  . cholecalciferol  1,000 Units Oral Daily  . enoxaparin (LOVENOX) injection  40 mg Subcutaneous Q24H  . ferrous sulfate  325 mg Oral Q breakfast  . furosemide  40 mg Intravenous BID  . losartan  50 mg Oral Daily  . multivitamin with minerals  1 tablet Oral Daily  . potassium chloride  20 mEq Oral Daily  . sodium chloride flush  3 mL Intravenous Q12H     Infusions:   PRN Medications:  sodium chloride, acetaminophen, ALPRAZolam, hydrALAZINE, ondansetron (ZOFRAN) IV, sodium chloride flush   Assessment:   1. Acute systolic HF with biventricular dysfunction   --ECho 06/02/16 LVEF 15-20% Moderate RV dysfunction   --Suspect NICM due to HTN but given CRFs including FHX of CAD will need R/L cath tomorrow   --Volume status ok   --Will continue carvedilol. Switch losartan to Entresto. Start spiro and digoxin. Cut lasix to 40 daily   --QRS 33ms  2. Severe HTN   --Much improved.    I have reviewed the risks, indications, and alternatives to angioplasty and stenting with the patient. Risks include but are not limited to bleeding, infection, vascular injury, stroke, myocardial infection, arrhythmia, kidney injury, radiation-related injury in the case of prolonged  fluoroscopy use, emergency cardiac surgery, and death. The patient understands the risks of serious complication is low (<1%) and he agrees to proceed.     Length of Stay: 1   Alynah Schone MD 06/03/2016, 10:55 AM  Advanced Heart Failure Team Pager [phone] (M-F; 7a - 4p)  Please contact [facility] for night-coverage after hours (4p -7a ) and weekends on [email or web address]

## 2016-06-03 NOTE — Progress Notes (Signed)
PROGRESS NOTE  Christine Wilson ZOX:[id number] DOB: 03/28/1961 DOA: 05/31/2016 PCP: No PCP Per Patient  Brief History: 55 year old female with a history of hypertension presented with two-week history of dyspnea on exertion, shortness of breath, and nonproductive cough. The patient states that over the past 2 days her shortness of breath has worsened to the point of having dyspnea with minimal exertion. She also started having orthopnea type symptoms. She states that she has not seen a physician for 5 years, and she stopped taking her antihypertensive medications 15 years ago. She denies any chest pain, dizziness, nausea, vomiting, diarrhea. She denies any fevers, chills, sick contacts. Upon presentation, the patient was found to have a blood pressure of 205/120. CT angiogram of chest was performed which was negative for pulmonary abuse but showed interstitial edema with bilateral small pleural effusions. The patient was started on intravenous furosemide with good clinical effect.  Assessment/Plan: Acute systolic and diastolic CHF -appreciate cardiology follow up -continue IV Lasix-->po lasix on 06/04/16 -NEG 3.5L since admission -Echo--EF 15-20%, grade 1 DD -??Entresto candidate--defer to cardiology-->started on Entresto 11/12 -06/01/16 CTA chest--interstitial edema, sm bilat pleural effusion, diffuse bronchial wall thickening; small pericardial effusion, Neg PE -continue coreg -aldactone added -06/04/16--planning LHC+RHC  HTN -continue carvedilol -may be Entresto candidate--defer to cardiology  Microcytic anemia -she has component of iron deficiency but indices suggest anemia of chronic illness -Nulecit x 1, then start oral iron -Iron saturation 14%, ferritin 85  Leg pain -venous duplex r/o DVT--neg -suspect a degree of restless leg related to iron deficiency  Hyperglycemia/impaired glucose tolerance -check A1C--6.3    Disposition Plan: Home when cleared by  cardiology Family Communication: NoFamily at bedside  Consultants: Cardiology  Code Status: FULL   DVT Prophylaxis: [place] Lovenox   Procedures: As Listed in Progress Note Above  Antibiotics: None    Subjective: Patient denies fevers, chills, headache, chest pain, dyspnea, nausea, vomiting, diarrhea, abdominal pain, dysuria, hematuria, hematochezia, and melena.   Objective: Vitals:   06/02/16 0423 06/03/16 0554 06/03/16 0745 06/03/16 1300  BP:  (!) 145/87 123/82 135/73  Pulse:  94 97 96  Resp:  16 17 20   Temp:  98.8 F (37.1 C) 98.1 F (36.7 C) 97.7 F (36.5 C)  TempSrc:  Oral Oral Oral  SpO2:  100% 100% 98%  Weight: 66.7 kg (147 lb 1.6 oz) 66.5 kg (146 lb 8 oz)    Height:        Intake/Output Summary (Last 24 hours) at 06/03/16 1652 Last data filed at 06/03/16 1300  Gross per 24 hour  Intake             1140 ml  Output             1250 ml  Net             -110 ml   Weight change: -0.272 kg (-9.6 oz) Exam:   General:  Pt is alert, follows commands appropriately, not in acute distress  HEENT: No icterus, No thrush, No neck mass, [place]/AT  Cardiovascular: RRR, S1/S2, no rubs, no gallops  Respiratory: CTA bilaterally, no wheezing, no crackles, no rhonchi  Abdomen: Soft/+BS, non tender, non distended, no guarding  Extremities: No edema, No lymphangitis, No petechiae, No rashes, no synovitis   Data Reviewed: I have personally reviewed following labs and imaging studies Basic Metabolic Panel:  Recent Labs Lab 05/31/16 1726 05/31/16 2003 06/01/16 0229 06/02/16 0235 06/03/16 0451  NA 141 139  141 143 141  K 4.2 3.8 3.7 3.7 4.1  CL 106 106 102 105 103  CO2 24 23 27 25 27   GLUCOSE 123* 95 148* 106* 99  BUN 13 11 11 18 20   CREATININE 0.79 0.82 0.97 1.03* 1.01*  CALCIUM 9.4 9.1 9.6 9.5 9.6  MG  --   --  1.9 2.2 2.3   Liver Function Tests:  Recent Labs Lab 05/31/16 1726  AST 40*  ALT 52*  ALKPHOS 77  BILITOT 0.8  PROT 7.1  ALBUMIN  4.2   No results for input(s): LIPASE, AMYLASE in the last 168 hours. No results for input(s): AMMONIA in the last 168 hours. Coagulation Profile: No results for input(s): INR, PROTIME in the last 168 hours. CBC:  Recent Labs Lab 05/31/16 1735 05/31/16 2003  WBC 8.5 8.2  HGB 12.2 11.9*  HCT 37.3* 38.1  MCV 67.0* 67.3*  PLT  --  271   Cardiac Enzymes: No results for input(s): CKTOTAL, CKMB, CKMBINDEX, TROPONINI in the last 168 hours. BNP: Invalid input(s): POCBNP CBG: No results for input(s): GLUCAP in the last 168 hours. HbA1C:  Recent Labs  06/02/16 0637  HGBA1C 6.3*   Urine analysis: No results found for: COLORURINE, APPEARANCEUR, LABSPEC, PHURINE, GLUCOSEU, HGBUR, BILIRUBINUR, KETONESUR, PROTEINUR, UROBILINOGEN, NITRITE, LEUKOCYTESUR Sepsis Labs: @LABRCNTIP (procalcitonin:4,lacticidven:4) )No results found for this or any previous visit (from the past 240 hour(s)).   Scheduled Meds: . carvedilol  6.25 mg Oral BID WC  . cholecalciferol  1,000 Units Oral Daily  . digoxin  0.125 mg Oral Daily  . enoxaparin (LOVENOX) injection  40 mg Subcutaneous Q24H  . ferrous sulfate  325 mg Oral Q breakfast  . [START ON 06/04/2016] furosemide  40 mg Oral Daily  . multivitamin with minerals  1 tablet Oral Daily  . sacubitril-valsartan  1 tablet Oral BID  . sodium chloride flush  3 mL Intravenous Q12H  . spironolactone  12.5 mg Oral Daily   Continuous Infusions:  Procedures/Studies: Dg Chest 2 View  Result Date: 05/31/2016 CLINICAL DATA:  Cough and dyspnea for 2 weeks EXAM: CHEST  2 VIEW COMPARISON:  None. FINDINGS: The heart is enlarged. There is aortic atherosclerosis. There is mild diffuse interstitial prominence with minimal peribronchial thickening suggestive of small airway inflammation/ bronchitic change. No pneumonic consolidation, effusion or pulmonary edema. No suspicious osseous lesions. IMPRESSION: Findings suggest mild bronchitic change of the lungs without pneumonic  consolidation, CHF, effusion or pneumothorax. Electronically Signed   By: Tollie Eth M.D.   On: 05/31/2016 20:03   Ct Angio Chest Pe W And/or Wo Contrast  Result Date: 05/31/2016 CLINICAL DATA:  Shortness of breath for 3 weeks. No chest pain. Nonsmoker. EXAM: CT ANGIOGRAPHY CHEST WITH CONTRAST TECHNIQUE: Multidetector CT imaging of the chest was performed using the standard protocol during bolus administration of intravenous contrast. Multiplanar CT image reconstructions and MIPs were obtained to evaluate the vascular anatomy. CONTRAST:  70 mL Isovue 370 COMPARISON:  None. FINDINGS: Cardiovascular: Technically adequate study with good opacification of the central and segmental pulmonary arteries. No focal filling defects. No evidence of significant pulmonary embolus. Diffuse cardiac enlargement. Small pericardial effusion. Normal caliber thoracic aorta. Mediastinum/Nodes: Mediastinal lymph nodes are mildly prominent diffusely without pathologic enlargement, likely reactive. Esophagus is decompressed. Small esophageal hiatal hernia. Lungs/Pleura: Small bilateral pleural effusions with basilar atelectasis, greater on the right. Diffuse interstitial pattern to the lungs consistent with interstitial edema. Diffuse bronchial wall thickening consistent with acute or chronic bronchitis. Airways are patent. No pneumothorax. Upper  Abdomen: No acute abnormality. Musculoskeletal: Degenerative changes in the spine. No destructive bone lesions. Review of the MIP images confirms the above findings. IMPRESSION: No evidence of significant pulmonary embolus. Cardiac enlargement with bilateral pleural effusions and diffuse interstitial edema consistent with congestive failure. Bronchial wall thickening may indicate acute or chronic bronchitis. Electronically Signed   By: Burman NievesWilliam  Stevens M.D.   On: 05/31/2016 23:56    Genevia Bouldin, DO  [facility] Pager [phone]  If 7PM-7AM, please contact  night-coverage [email or web address] Password [email or web address] 06/03/2016, 4:52 PM   LOS: 1 day

## 2016-06-04 ENCOUNTER — Encounter (HOSPITAL_COMMUNITY): Payer: Self-pay | Admitting: Internal Medicine

## 2016-06-04 ENCOUNTER — Encounter (HOSPITAL_COMMUNITY)
Admission: EM | Disposition: A | Discharge status: Home / Self Care | Payer: Self-pay | Source: Home / Self Care | Attending: Internal Medicine

## 2016-06-04 DIAGNOSIS — I160 Hypertensive urgency: Secondary | ICD-10-CM | POA: Diagnosis not present

## 2016-06-04 DIAGNOSIS — D509 Iron deficiency anemia, unspecified: Secondary | ICD-10-CM | POA: Diagnosis not present

## 2016-06-04 DIAGNOSIS — I5021 Acute systolic (congestive) heart failure: Secondary | ICD-10-CM

## 2016-06-04 DIAGNOSIS — I5041 Acute combined systolic (congestive) and diastolic (congestive) heart failure: Secondary | ICD-10-CM

## 2016-06-04 DIAGNOSIS — I2510 Atherosclerotic heart disease of native coronary artery without angina pectoris: Secondary | ICD-10-CM | POA: Diagnosis not present

## 2016-06-04 DIAGNOSIS — R7302 Impaired glucose tolerance (oral): Secondary | ICD-10-CM

## 2016-06-04 HISTORY — PX: CARDIAC CATHETERIZATION: SHX172

## 2016-06-04 LAB — POCT I-STAT 3, VENOUS BLOOD GAS (G3P V)
Acid-Base Excess: 2 mmol/L (ref 0.0–2.0)
Acid-Base Excess: 3 mmol/L — ABNORMAL HIGH (ref 0.0–2.0)
Bicarbonate: 28.5 mmol/L — ABNORMAL HIGH (ref 20.0–28.0)
Bicarbonate: 28.6 mmol/L — ABNORMAL HIGH (ref 20.0–28.0)
O2 Saturation: 42 %
O2 Saturation: 44 %
TCO2: 30 mmol/L (ref 0–100)
TCO2: 30 mmol/L (ref 0–100)
pCO2, Ven: 48 mmHg (ref 44.0–60.0)
pCO2, Ven: 49.1 mmHg (ref 44.0–60.0)
pH, Ven: 7.372 (ref 7.250–7.430)
pH, Ven: 7.383 (ref 7.250–7.430)
pO2, Ven: 25 mmHg — CL (ref 32.0–45.0)
pO2, Ven: 25 mmHg — CL (ref 32.0–45.0)

## 2016-06-04 LAB — CBC
HCT: 43 % (ref 36.0–46.0)
Hemoglobin: 13.5 g/dL (ref 12.0–15.0)
MCH: 21.3 pg — ABNORMAL LOW (ref 26.0–34.0)
MCHC: 31.4 g/dL (ref 30.0–36.0)
MCV: 67.9 fL — ABNORMAL LOW (ref 78.0–100.0)
Platelets: 289 10*3/uL (ref 150–400)
RBC: 6.33 MIL/uL — ABNORMAL HIGH (ref 3.87–5.11)
RDW: 16.5 % — ABNORMAL HIGH (ref 11.5–15.5)
WBC: 7.7 10*3/uL (ref 4.0–10.5)

## 2016-06-04 LAB — BASIC METABOLIC PANEL
Anion gap: 10 (ref 5–15)
BUN: 26 mg/dL — ABNORMAL HIGH (ref 6–20)
CO2: 24 mmol/L (ref 22–32)
Calcium: 9.3 mg/dL (ref 8.9–10.3)
Chloride: 104 mmol/L (ref 101–111)
Creatinine, Ser: 0.96 mg/dL (ref 0.44–1.00)
GFR calc Af Amer: >60 mL/min (ref >60)
GFR calc non Af Amer: >60 mL/min (ref >60)
Glucose, Bld: 118 mg/dL — ABNORMAL HIGH (ref 65–99)
Potassium: 4.1 mmol/L (ref 3.5–5.1)
Sodium: 138 mmol/L (ref 135–145)

## 2016-06-04 LAB — POCT I-STAT 3, ART BLOOD GAS (G3+)
Bicarbonate: 23.8 mmol/L (ref 20.0–28.0)
O2 Saturation: 95 %
TCO2: 25 mmol/L (ref 0–100)
pCO2 arterial: 35.3 mmHg (ref 32.0–48.0)
pH, Arterial: 7.437 (ref 7.350–7.450)
pO2, Arterial: 71 mmHg — ABNORMAL LOW (ref 83.0–108.0)

## 2016-06-04 LAB — PROTIME-INR
INR: 1.04
Prothrombin Time: 13.6 seconds (ref 11.4–15.2)

## 2016-06-04 LAB — CREATININE, SERUM
Creatinine, Ser: 0.92 mg/dL (ref 0.44–1.00)
GFR calc Af Amer: >60 mL/min (ref >60)
GFR calc non Af Amer: >60 mL/min (ref >60)

## 2016-06-04 SURGERY — RIGHT/LEFT HEART CATH AND CORONARY ANGIOGRAPHY
Anesthesia: LOCAL

## 2016-06-04 MED ORDER — FENTANYL CITRATE (PF) 100 MCG/2ML IJ SOLN
INTRAMUSCULAR | Status: AC
  Filled 2016-06-04: qty 2

## 2016-06-04 MED ORDER — FENTANYL CITRATE (PF) 100 MCG/2ML IJ SOLN
INTRAMUSCULAR | Status: DC | PRN
  Administered 2016-06-04: 25 ug via INTRAVENOUS

## 2016-06-04 MED ORDER — FUROSEMIDE 40 MG PO TABS
40.0000 mg | ORAL_TABLET | Freq: Every day | ORAL | Status: DC
  Administered 2016-06-05 – 2016-06-06 (×2): 40 mg via ORAL
  Filled 2016-06-04 (×2): qty 1

## 2016-06-04 MED ORDER — ACETAMINOPHEN 325 MG PO TABS: 650.0000 mg | ORAL_TABLET | ORAL | Status: DC | PRN

## 2016-06-04 MED ORDER — SODIUM CHLORIDE 0.9 % IV SOLN: 250.0000 mL | INTRAVENOUS | Status: DC | PRN

## 2016-06-04 MED ORDER — ENOXAPARIN SODIUM 40 MG/0.4ML ~~LOC~~ SOLN
40.0000 mg | SUBCUTANEOUS | Status: DC
  Administered 2016-06-06: 40 mg via SUBCUTANEOUS
  Filled 2016-06-04: qty 0.4

## 2016-06-04 MED ORDER — IOPAMIDOL (ISOVUE-370) INJECTION 76%
INTRAVENOUS | Status: DC | PRN
  Administered 2016-06-04: 40 mL via INTRA_ARTERIAL

## 2016-06-04 MED ORDER — SODIUM CHLORIDE 0.9 % IV SOLN
INTRAVENOUS | Status: AC
  Administered 2016-06-04: 10:00:00 via INTRAVENOUS

## 2016-06-04 MED ORDER — LIDOCAINE HCL (PF) 1 % IJ SOLN
INTRAMUSCULAR | Status: AC
  Filled 2016-06-04: qty 30

## 2016-06-04 MED ORDER — SODIUM CHLORIDE 0.9% FLUSH
3.0000 mL | Freq: Two times a day (BID) | INTRAVENOUS | Status: DC
  Administered 2016-06-04 – 2016-06-05 (×3): 3 mL via INTRAVENOUS

## 2016-06-04 MED ORDER — SODIUM CHLORIDE 0.9% FLUSH: 3.0000 mL | INTRAVENOUS | Status: DC | PRN

## 2016-06-04 MED ORDER — SPIRONOLACTONE 25 MG PO TABS
12.5000 mg | ORAL_TABLET | Freq: Every day | ORAL | Status: DC
  Administered 2016-06-05 – 2016-06-06 (×2): 12.5 mg via ORAL
  Filled 2016-06-04 (×2): qty 1

## 2016-06-04 MED ORDER — ONDANSETRON HCL 4 MG/2ML IJ SOLN: 4.0000 mg | Freq: Four times a day (QID) | INTRAMUSCULAR | Status: DC | PRN

## 2016-06-04 MED ORDER — LIDOCAINE HCL (PF) 1 % IJ SOLN
INTRAMUSCULAR | Status: DC | PRN
  Administered 2016-06-04: 18 mL via SUBCUTANEOUS

## 2016-06-04 MED ORDER — SODIUM CHLORIDE 0.9% FLUSH
3.0000 mL | Freq: Two times a day (BID) | INTRAVENOUS | Status: DC
  Administered 2016-06-04: 3 mL via INTRAVENOUS

## 2016-06-04 MED ORDER — HEPARIN (PORCINE) IN NACL 2-0.9 UNIT/ML-% IJ SOLN
INTRAMUSCULAR | Status: AC
  Filled 2016-06-04: qty 1500

## 2016-06-04 MED ORDER — MIDAZOLAM HCL 2 MG/2ML IJ SOLN
INTRAMUSCULAR | Status: DC | PRN
  Administered 2016-06-04: 1 mg via INTRAVENOUS

## 2016-06-04 MED ORDER — ASPIRIN 81 MG PO CHEW
81.0000 mg | CHEWABLE_TABLET | ORAL | Status: AC
  Administered 2016-06-04: 81 mg via ORAL
  Filled 2016-06-04: qty 1

## 2016-06-04 MED ORDER — SODIUM CHLORIDE 0.9 % IV SOLN
INTRAVENOUS | Status: DC
  Administered 2016-06-04: 08:00:00 via INTRAVENOUS

## 2016-06-04 MED ORDER — MIDAZOLAM HCL 2 MG/2ML IJ SOLN
INTRAMUSCULAR | Status: AC
  Filled 2016-06-04: qty 2

## 2016-06-04 MED ORDER — HEPARIN (PORCINE) IN NACL 2-0.9 UNIT/ML-% IJ SOLN
INTRAMUSCULAR | Status: DC | PRN
  Administered 2016-06-04: 1500 mL

## 2016-06-04 MED ORDER — IOPAMIDOL (ISOVUE-370) INJECTION 76%
INTRAVENOUS | Status: AC
  Filled 2016-06-04: qty 100

## 2016-06-04 SURGICAL SUPPLY — 9 items
CATH INFINITI 5FR MULTPACK ANG (CATHETERS) ×2 IMPLANT
CATH SWAN GANZ 7F STRAIGHT (CATHETERS) ×2 IMPLANT
GUIDEWIRE 3MM J TIP .035 145 (WIRE) ×2 IMPLANT
KIT HEART LEFT (KITS) ×2 IMPLANT
KIT HEART RIGHT NAMIC (KITS) ×2 IMPLANT
SHEATH PINNACLE 5F 10CM (SHEATH) ×2 IMPLANT
SHEATH PINNACLE 7F 10CM (SHEATH) ×2 IMPLANT
TRANSDUCER W/STOPCOCK (MISCELLANEOUS) ×4 IMPLANT
TUBING CIL FLEX 10 FLL-RA (TUBING) ×2 IMPLANT

## 2016-06-04 NOTE — Progress Notes (Signed)
PROGRESS NOTE  Christine Wilson OZH:[id number] DOB: 10/23/1960 DOA: 05/31/2016 PCP: No PCP Per Patient  Brief History: 55 year old female with a history of hypertension presented with two-week history of dyspnea on exertion, shortness of breath, and nonproductive cough. The patient states that over the past 2 days her shortness of breath has worsened to the point of having dyspnea with minimal exertion. She also started having orthopnea type symptoms. She states that she has not seen a physician for 5 years, and she stopped taking her antihypertensive medications 15 years ago. She denies any chest pain, dizziness, nausea, vomiting, diarrhea. She denies any fevers, chills, sick contacts. Upon presentation, the patient was found to have a blood pressure of 205/120. CT angiogram of chest was performed which was negative for pulmonary abuse but showed interstitial edema with bilateral small pleural effusions. The patient was started on intravenous furosemide with good clinical effect.  Assessment/Plan: Acute systolic and diastolicCHF -appreciate cardiology follow up -continue IV Lasix-->po lasix on 06/04/16 -NEG 4L since admission -Echo--EF 15-20%, grade 1 DD -??Entresto candidate--defer to cardiology-->started on Entresto 11/12 -06/01/16 CTA chest--interstitial edema, sm bilat pleural effusion, diffuse bronchial wall thickening; small pericardial effusion, Neg PE -continue coreg -aldactone added -06/04/16-LHC+RHC--moderate non-obstructive CAD, 60% LAD, SVR 3087, suspect HTN-NICM  HTN -continue Entresto/Aldactone  Microcytic anemia -she has component of iron deficiency but indices suggest anemia of chronic illness -Nulecit x 1, then start oral iron -Iron saturation 14%, ferritin 85  Leg pain -venous duplex r/o DVT--neg -suspect a degree of restless leg related to iron deficiency  Hyperglycemia/impaired glucose tolerance -check A1C--6.3    Disposition Plan: Home  when cleared by cardiology Family Communication: NoFamily at bedside  Consultants: Cardiology  Code Status: FULL   DVT Prophylaxis: [place] Lovenox   Procedures: As Listed in Progress Note Above  Antibiotics: None   Subjective: Patient denies fevers, chills, headache, chest pain, dyspnea, nausea, vomiting, diarrhea, abdominal pain, dysuria, hematuria, hematochezia, and melena.   Objective: Vitals:   06/04/16 1145 06/04/16 1200 06/04/16 1230 06/04/16 1300  BP: (!) 154/82 137/78 (!) 125/99 133/71  Pulse:      Resp:      Temp:      TempSrc:      SpO2:      Weight:      Height:        Intake/Output Summary (Last 24 hours) at 06/04/16 1642 Last data filed at 06/04/16 0930  Gross per 24 hour  Intake               60 ml  Output              900 ml  Net             -840 ml   Weight change: 0.68 kg (1 lb 8 oz) Exam:   General:  Pt is alert, follows commands appropriately, not in acute distress  HEENT: No icterus, No thrush, No neck mass, [place]/AT  Cardiovascular: RRR, S1/S2, no rubs, no gallops  Respiratory: CTA bilaterally, no wheezing, no crackles, no rhonchi  Abdomen: Soft/+BS, non tender, non distended, no guarding  Extremities: No edema, No lymphangitis, No petechiae, No rashes, no synovitis   Data Reviewed: I have personally reviewed following labs and imaging studies Basic Metabolic Panel:  Recent Labs Lab 05/31/16 2003 06/01/16 0229 06/02/16 0235 06/03/16 0451 06/04/16 0131 06/04/16 1139  NA 139 141 143 141 138  --   K 3.8 3.7 3.7 4.1  4.1  --   CL 106 102 105 103 104  --   CO2 23 27 25 27 24   --   GLUCOSE 95 148* 106* 99 118*  --   BUN 11 11 18 20  26*  --   CREATININE 0.82 0.97 1.03* 1.01* 0.96 0.92  CALCIUM 9.1 9.6 9.5 9.6 9.3  --   MG  --  1.9 2.2 2.3  --   --    Liver Function Tests:  Recent Labs Lab 05/31/16 1726  AST 40*  ALT 52*  ALKPHOS 77  BILITOT 0.8  PROT 7.1  ALBUMIN 4.2   No results for input(s): LIPASE,  AMYLASE in the last 168 hours. No results for input(s): AMMONIA in the last 168 hours. Coagulation Profile:  Recent Labs Lab 06/04/16 0138  INR 1.04   CBC:  Recent Labs Lab 05/31/16 1735 05/31/16 2003 06/04/16 1139  WBC 8.5 8.2 7.7  HGB 12.2 11.9* 13.5  HCT 37.3* 38.1 43.0  MCV 67.0* 67.3* 67.9*  PLT  --  271 289   Cardiac Enzymes: No results for input(s): CKTOTAL, CKMB, CKMBINDEX, TROPONINI in the last 168 hours. BNP: Invalid input(s): POCBNP CBG: No results for input(s): GLUCAP in the last 168 hours. HbA1C:  Recent Labs  06/02/16 0637  HGBA1C 6.3*   Urine analysis: No results found for: COLORURINE, APPEARANCEUR, LABSPEC, PHURINE, GLUCOSEU, HGBUR, BILIRUBINUR, KETONESUR, PROTEINUR, UROBILINOGEN, NITRITE, LEUKOCYTESUR Sepsis Labs: @LABRCNTIP (procalcitonin:4,lacticidven:4) )No results found for this or any previous visit (from the past 240 hour(s)).   Scheduled Meds: . cholecalciferol  1,000 Units Oral Daily  . [START ON 06/05/2016] enoxaparin (LOVENOX) injection  40 mg Subcutaneous Q24H  . ferrous sulfate  325 mg Oral Q breakfast  . [START ON 06/05/2016] furosemide  40 mg Oral Daily  . multivitamin with minerals  1 tablet Oral Daily  . sacubitril-valsartan  1 tablet Oral BID  . sodium chloride flush  3 mL Intravenous Q12H  . sodium chloride flush  3 mL Intravenous Q12H  . [START ON 06/05/2016] spironolactone  12.5 mg Oral Daily   Continuous Infusions:  Procedures/Studies: Dg Chest 2 View  Result Date: 05/31/2016 CLINICAL DATA:  Cough and dyspnea for 2 weeks EXAM: CHEST  2 VIEW COMPARISON:  None. FINDINGS: The heart is enlarged. There is aortic atherosclerosis. There is mild diffuse interstitial prominence with minimal peribronchial thickening suggestive of small airway inflammation/ bronchitic change. No pneumonic consolidation, effusion or pulmonary edema. No suspicious osseous lesions. IMPRESSION: Findings suggest mild bronchitic change of the lungs without  pneumonic consolidation, CHF, effusion or pneumothorax. Electronically Signed   By: Tollie Eth M.D.   On: 05/31/2016 20:03   Ct Angio Chest Pe W And/or Wo Contrast  Result Date: 05/31/2016 CLINICAL DATA:  Shortness of breath for 3 weeks. No chest pain. Nonsmoker. EXAM: CT ANGIOGRAPHY CHEST WITH CONTRAST TECHNIQUE: Multidetector CT imaging of the chest was performed using the standard protocol during bolus administration of intravenous contrast. Multiplanar CT image reconstructions and MIPs were obtained to evaluate the vascular anatomy. CONTRAST:  70 mL Isovue 370 COMPARISON:  None. FINDINGS: Cardiovascular: Technically adequate study with good opacification of the central and segmental pulmonary arteries. No focal filling defects. No evidence of significant pulmonary embolus. Diffuse cardiac enlargement. Small pericardial effusion. Normal caliber thoracic aorta. Mediastinum/Nodes: Mediastinal lymph nodes are mildly prominent diffusely without pathologic enlargement, likely reactive. Esophagus is decompressed. Small esophageal hiatal hernia. Lungs/Pleura: Small bilateral pleural effusions with basilar atelectasis, greater on the right. Diffuse interstitial pattern to the lungs consistent  with interstitial edema. Diffuse bronchial wall thickening consistent with acute or chronic bronchitis. Airways are patent. No pneumothorax. Upper Abdomen: No acute abnormality. Musculoskeletal: Degenerative changes in the spine. No destructive bone lesions. Review of the MIP images confirms the above findings. IMPRESSION: No evidence of significant pulmonary embolus. Cardiac enlargement with bilateral pleural effusions and diffuse interstitial edema consistent with congestive failure. Bronchial wall thickening may indicate acute or chronic bronchitis. Electronically Signed   By: Burman Nieves M.D.   On: 05/31/2016 23:56    Alethia Melendrez, DO  [facility] Pager [phone]  If 7PM-7AM, please contact  night-coverage [email or web address] Password [email or web address] 06/04/2016, 4:42 PM   LOS: 2 days

## 2016-06-04 NOTE — H&P (View-Only) (Signed)
  Advanced Heart Failure Rounding Note   Subjective:    Feels better. Denies, dyspnea, orthopnea or PND.   Echo reviewed personally. EF 15-20% with moderate RV dysfunction.     Objective:   Weight Range:  Vital Signs:   Temp:  [98 F (36.7 C)-98.8 F (37.1 C)] 98.1 F (36.7 C) (11/12 0745) Pulse Rate:  [91-101] 97 (11/12 0745) Resp:  [16-18] 17 (11/12 0745) BP: (123-164)/(72-87) 123/82 (11/12 0745) SpO2:  [100 %] 100 % (11/12 0745) Weight:  [66.5 kg (146 lb 8 oz)] 66.5 kg (146 lb 8 oz) (11/12 0554) Last BM Date: 05/30/16 (pt. states "it was wednessday")  Weight change: Filed Weights   06/01/16 0151 06/02/16 0423 06/03/16 0554  Weight: 67.1 kg (147 lb 14.4 oz) 66.7 kg (147 lb 1.6 oz) 66.5 kg (146 lb 8 oz)    Intake/Output:   Intake/Output Summary (Last 24 hours) at 06/03/16 1055 Last data filed at 06/03/16 0833  Gross per 24 hour  Intake             1062 ml  Output             1450 ml  Net             -388 ml     Physical Exam: General:  Well appearing. No resp difficulty HEENT: normal Neck: supple. JVP 7-8 . Carotids 2+ bilat; no bruits. No lymphadenopathy or thryomegaly appreciated. Cor: PMI nondisplaced. Regular rate & rhythm. 2/6 TR Lungs: clear Abdomen: soft, nontender, nondistended. No hepatosplenomegaly. No bruits or masses. Good bowel sounds. Extremities: no cyanosis, clubbing, rash, edema. Warm  Neuro: alert & orientedx3, cranial nerves grossly intact. moves all 4 extremities w/o difficulty. Affect pleasant  Telemetry: SR 90s  Labs: Basic Metabolic Panel:  Recent Labs Lab 05/31/16 1726 05/31/16 2003 06/01/16 0229 06/02/16 0235 06/03/16 0451  NA 141 139 141 143 141  K 4.2 3.8 3.7 3.7 4.1  CL 106 106 102 105 103  CO2 24 23 27 25 27  GLUCOSE 123* 95 148* 106* 99  BUN 13 11 11 18 20  CREATININE 0.79 0.82 0.97 1.03* 1.01*  CALCIUM 9.4 9.1 9.6 9.5 9.6  MG  --   --  1.9 2.2 2.3    Liver Function Tests:  Recent Labs Lab 05/31/16 1726    AST 40*  ALT 52*  ALKPHOS 77  BILITOT 0.8  PROT 7.1  ALBUMIN 4.2   No results for input(s): LIPASE, AMYLASE in the last 168 hours. No results for input(s): AMMONIA in the last 168 hours.  CBC:  Recent Labs Lab 05/31/16 1735 05/31/16 2003  WBC 8.5 8.2  HGB 12.2 11.9*  HCT 37.3* 38.1  MCV 67.0* 67.3*  PLT  --  271    Cardiac Enzymes: No results for input(s): CKTOTAL, CKMB, CKMBINDEX, TROPONINI in the last 168 hours.  BNP: BNP (last 3 results)  Recent Labs  05/31/16 2003  BNP 731.4*    ProBNP (last 3 results) No results for input(s): PROBNP in the last 8760 hours.    Other results:  Imaging:  No results found.   Medications:     Scheduled Medications: . carvedilol  6.25 mg Oral BID WC  . cholecalciferol  1,000 Units Oral Daily  . enoxaparin (LOVENOX) injection  40 mg Subcutaneous Q24H  . ferrous sulfate  325 mg Oral Q breakfast  . furosemide  40 mg Intravenous BID  . losartan  50 mg Oral Daily  . multivitamin with minerals    1 tablet Oral Daily  . potassium chloride  20 mEq Oral Daily  . sodium chloride flush  3 mL Intravenous Q12H     Infusions:   PRN Medications:  sodium chloride, acetaminophen, ALPRAZolam, hydrALAZINE, ondansetron (ZOFRAN) IV, sodium chloride flush   Assessment:   1. Acute systolic HF with biventricular dysfunction   --ECho 06/02/16 LVEF 15-20% Moderate RV dysfunction   --Suspect NICM due to HTN but given CRFs including FHX of CAD will need R/L cath tomorrow   --Volume status ok   --Will continue carvedilol. Switch losartan to Entresto. Start spiro and digoxin. Cut lasix to 40 daily   --QRS 33ms  2. Severe HTN   --Much improved.    I have reviewed the risks, indications, and alternatives to angioplasty and stenting with the patient. Risks include but are not limited to bleeding, infection, vascular injury, stroke, myocardial infection, arrhythmia, kidney injury, radiation-related injury in the case of prolonged  fluoroscopy use, emergency cardiac surgery, and death. The patient understands the risks of serious complication is low (<1%) and he agrees to proceed.     Length of Stay: 1   Esbeydi Manago MD 06/03/2016, 10:55 AM  Advanced Heart Failure Team Pager [phone] (M-F; 7a - 4p)  Please contact [facility] for night-coverage after hours (4p -7a ) and weekends on [email or web address]

## 2016-06-04 NOTE — Interval H&P Note (Signed)
History and Physical Interval Note:  06/04/2016 8:31 AM  Kathe Becton  has presented today for surgery, with the diagnosis of unstable angina  The various methods of treatment have been discussed with the patient and family. After consideration of risks, benefits and other options for treatment, the patient has consented to  Procedure(s): Right/Left Heart Cath and Coronary Angiography (N/A) and possible coronary angioplasty as a surgical intervention .  The patient's history has been reviewed, patient examined, no change in status, stable for surgery.  I have reviewed the patient's chart and labs.  Questions were answered to the patient's satisfaction.    Cath Lab Visit (complete for each Cath Lab visit)  Clinical Evaluation Leading to the Procedure:   ACS: No.  Non-ACS:    Anginal Classification: CCS IV  Anti-ischemic medical therapy: Minimal Therapy (1 class of medications)  Non-Invasive Test Results: No non-invasive testing performed  Prior CABG: No previous CABG        Tracia Lacomb, Reuel Boom

## 2016-06-04 NOTE — Progress Notes (Signed)
Advanced Heart Failure Rounding Note   Subjective:    Lasix switched to po yesterday. Weight up 2 pounds.   Denies, dyspnea, orthopnea or PND. For cath today.   Echo reviewed personally. EF 15-20% with moderate RV dysfunction.     Objective:   Weight Range:  Vital Signs:   Temp:  [97.7 F (36.5 C)-99 F (37.2 C)] 98.2 F (36.8 C) (11/13 0455) Pulse Rate:  [88-96] 88 (11/13 0455) Resp:  [16-20] 18 (11/13 0455) BP: (135-138)/(73-86) 137/86 (11/13 0455) SpO2:  [98 %-100 %] 98 % (11/13 0455) Weight:  [67.1 kg (148 lb)] 67.1 kg (148 lb) (11/13 0455) Last BM Date: 05/30/16  Weight change: Filed Weights   06/02/16 0423 06/03/16 0554 06/04/16 0455  Weight: 66.7 kg (147 lb 1.6 oz) 66.5 kg (146 lb 8 oz) 67.1 kg (148 lb)    Intake/Output:   Intake/Output Summary (Last 24 hours) at 06/04/16 0857 Last data filed at 06/04/16 0600  Gross per 24 hour  Intake              360 ml  Output             1350 ml  Net             -990 ml     Physical Exam: General:  Well appearing. No resp difficulty HEENT: normal Neck: supple. JVP 8-9 . Carotids 2+ bilat; no bruits. No lymphadenopathy or thryomegaly appreciated. Cor: PMI nondisplaced. Regular rate & rhythm. 2/6 TR Lungs: clear Abdomen: soft, nontender, nondistended. No hepatosplenomegaly. No bruits or masses. Good bowel sounds. Extremities: no cyanosis, clubbing, rash, edema. Warm  Neuro: alert & orientedx3, cranial nerves grossly intact. moves all 4 extremities w/o difficulty. Affect pleasant  Telemetry: SR 90s  Labs: Basic Metabolic Panel:  Recent Labs Lab 05/31/16 2003 06/01/16 0229 06/02/16 0235 06/03/16 0451 06/04/16 0131  NA 139 141 143 141 138  K 3.8 3.7 3.7 4.1 4.1  CL 106 102 105 103 104  CO2 23 27 25 27 24   GLUCOSE 95 148* 106* 99 118*  BUN 11 11 18 20  26*  CREATININE 0.82 0.97 1.03* 1.01* 0.96  CALCIUM 9.1 9.6 9.5 9.6 9.3  MG  --  1.9 2.2 2.3  --     Liver Function Tests:  Recent Labs Lab  05/31/16 1726  AST 40*  ALT 52*  ALKPHOS 77  BILITOT 0.8  PROT 7.1  ALBUMIN 4.2   No results for input(s): LIPASE, AMYLASE in the last 168 hours. No results for input(s): AMMONIA in the last 168 hours.  CBC:  Recent Labs Lab 05/31/16 1735 05/31/16 2003  WBC 8.5 8.2  HGB 12.2 11.9*  HCT 37.3* 38.1  MCV 67.0* 67.3*  PLT  --  271    Cardiac Enzymes: No results for input(s): CKTOTAL, CKMB, CKMBINDEX, TROPONINI in the last 168 hours.  BNP: BNP (last 3 results)  Recent Labs  05/31/16 2003  BNP 731.4*    ProBNP (last 3 results) No results for input(s): PROBNP in the last 8760 hours.    Other results:  Imaging: No results found.   Medications:     Scheduled Medications: . [MAR Hold] carvedilol  6.25 mg Oral BID WC  . [MAR Hold] cholecalciferol  1,000 Units Oral Daily  . digoxin  0.125 mg Oral Daily  . [MAR Hold] enoxaparin (LOVENOX) injection  40 mg Subcutaneous Q24H  . [MAR Hold] ferrous sulfate  325 mg Oral Q breakfast  . furosemide  40  mg Oral Daily  . [MAR Hold] multivitamin with minerals  1 tablet Oral Daily  . [MAR Hold] sacubitril-valsartan  1 tablet Oral BID  . [MAR Hold] sodium chloride flush  3 mL Intravenous Q12H  . sodium chloride flush  3 mL Intravenous Q12H  . spironolactone  12.5 mg Oral Daily    Infusions: . sodium chloride 50 mL/hr at 06/04/16 0800  . heparin      PRN Medications: [MAR Hold] sodium chloride, sodium chloride, [MAR Hold] acetaminophen, [MAR Hold] ALPRAZolam, fentaNYL, heparin, [MAR Hold] hydrALAZINE, midazolam, [MAR Hold] ondansetron (ZOFRAN) IV, [MAR Hold] sodium chloride flush, sodium chloride flush   Assessment:   1. Acute systolic HF with biventricular dysfunction   --[facility]ho 06/02/16 LVEF 15-20% Moderate RV dysfunction   --Suspect NICM due to HTN but given CRFs including FHX of CAD we are planning R/L cath today   --Volume status ok   --Will continue carvedilol, Entresto, spiro and digoxin.     -- Continue oral  lasix. May need to increase dose. Await results of cath    --QRS 90ms  2. Severe HTN   --Much improved.    Length of Stay: 2   Arvilla MeresBensimhon, Louvinia Cumbo MD 06/04/2016, 8:57 AM  Advanced Heart Failure Team Pager [phone] (M-F; 7a - 4p)  Please contact [facility] for night-coverage after hours (4p -7a ) and weekends on [email or web address]

## 2016-06-04 NOTE — Progress Notes (Signed)
Site area: Right groin a 5 french arterial and 7 french venous sheaths were removed  Site Prior to Removal:  Level 0  Pressure Applied For 15 MINUTES    Bedrest Beginning at 1010am  Manual:   Yes.    Patient Status During Pull:  stable  Post Pull Groin Site:  Level 0  Post Pull Instructions Given:  Yes.    Post Pull Pulses Present:  Yes.    Dressing Applied:  Yes.    Comments:  VS remain stable at this time

## 2016-06-05 ENCOUNTER — Inpatient Hospital Stay (HOSPITAL_COMMUNITY): Payer: Self-pay

## 2016-06-05 DIAGNOSIS — I429 Cardiomyopathy, unspecified: Secondary | ICD-10-CM | POA: Diagnosis not present

## 2016-06-05 DIAGNOSIS — I5021 Acute systolic (congestive) heart failure: Secondary | ICD-10-CM | POA: Diagnosis not present

## 2016-06-05 DIAGNOSIS — I160 Hypertensive urgency: Secondary | ICD-10-CM | POA: Diagnosis not present

## 2016-06-05 DIAGNOSIS — D509 Iron deficiency anemia, unspecified: Secondary | ICD-10-CM | POA: Diagnosis not present

## 2016-06-05 DIAGNOSIS — I5041 Acute combined systolic (congestive) and diastolic (congestive) heart failure: Secondary | ICD-10-CM | POA: Diagnosis not present

## 2016-06-05 DIAGNOSIS — R7302 Impaired glucose tolerance (oral): Secondary | ICD-10-CM | POA: Diagnosis not present

## 2016-06-05 LAB — BASIC METABOLIC PANEL
Anion gap: 9 (ref 5–15)
BUN: 17 mg/dL (ref 6–20)
CO2: 24 mmol/L (ref 22–32)
Calcium: 8.9 mg/dL (ref 8.9–10.3)
Chloride: 108 mmol/L (ref 101–111)
Creatinine, Ser: 0.81 mg/dL (ref 0.44–1.00)
GFR calc Af Amer: >60 mL/min (ref >60)
GFR calc non Af Amer: >60 mL/min (ref >60)
Glucose, Bld: 111 mg/dL — ABNORMAL HIGH (ref 65–99)
Potassium: 4.2 mmol/L (ref 3.5–5.1)
Sodium: 141 mmol/L (ref 135–145)

## 2016-06-05 MED ORDER — DIGOXIN 125 MCG PO TABS
0.1250 mg | ORAL_TABLET | Freq: Every day | ORAL | Status: DC
  Administered 2016-06-05 – 2016-06-06 (×2): 0.125 mg via ORAL
  Filled 2016-06-05 (×2): qty 1

## 2016-06-05 MED ORDER — ATORVASTATIN CALCIUM 40 MG PO TABS
40.0000 mg | ORAL_TABLET | Freq: Every day | ORAL | Status: DC
  Administered 2016-06-05: 40 mg via ORAL
  Filled 2016-06-05: qty 1

## 2016-06-05 MED ORDER — SACUBITRIL-VALSARTAN 49-51 MG PO TABS
1.0000 | ORAL_TABLET | Freq: Two times a day (BID) | ORAL | Status: DC
  Administered 2016-06-05 (×2): 1 via ORAL
  Filled 2016-06-05 (×3): qty 1

## 2016-06-05 MED ORDER — GADOBENATE DIMEGLUMINE 529 MG/ML IV SOLN
25.0000 mL | Freq: Once | INTRAVENOUS | Status: AC
  Administered 2016-06-05: 23 mL via INTRAVENOUS

## 2016-06-05 MED ORDER — ASPIRIN EC 81 MG PO TBEC
81.0000 mg | DELAYED_RELEASE_TABLET | Freq: Every day | ORAL | Status: DC
  Administered 2016-06-05 – 2016-06-06 (×2): 81 mg via ORAL
  Filled 2016-06-05 (×2): qty 1

## 2016-06-05 NOTE — Progress Notes (Addendum)
PROGRESS NOTE  Kathe Bectonamela Yeater BJY:[id number] DOB: 01-Dec-1960 DOA: 05/31/2016 PCP: No PCP Per Patient  Brief History: 55 year old female with a history of hypertension presented with two-week history of dyspnea on exertion, shortness of breath, and nonproductive cough. The patient states that over the past 2 days her shortness of breath has worsened to the point of having dyspnea with minimal exertion. She also started having orthopnea type symptoms. She states that she has not seen a physician for 5 years, and she stopped taking her antihypertensive medications 15 years ago. She denies any chest pain, dizziness, nausea, vomiting, diarrhea. She denies any fevers, chills, sick contacts. Upon presentation, the patient was found to have a blood pressure of 205/120. CT angiogram of chest was performed which was negative for pulmonary abuse but showed interstitial edema with bilateral small pleural effusions. The patient was started on intravenous furosemide with good clinical effect.  Assessment/Plan: Acute systolic and diastolicCHF -appreciate cardiology follow up -continue IV Lasix-->po lasix on 06/04/16 -NEG 5L since admission -up one lbs since admission (147) -Echo--EF 15-20%, grade 1 DD -started on Entresto 11/12 -06/01/16 CTA chest--interstitial edema, sm bilat pleural effusion, diffuse bronchial wall thickening; small pericardial effusion, Neg PE -continue coreg, digoxin -aldactone added -06/04/16-LHC+RHC--moderate non-obstructive CAD, 60% LAD, SVR 3087, suspect HTN-NICM -06/05/16 Cardiac MRI per cards  HTN -continue Entresto/Aldactone  Microcytic anemia -she has component of iron deficiency but indices suggest anemia of chronic illness -Nulecit x 1, then start oral iron -Iron saturation 14%, ferritin 85  Leg pain -venous duplex r/o DVT--neg -suspect a degree of restless leg related to iron deficiency  Hyperglycemia/impaired glucose tolerance -check  A1C--6.3    Disposition Plan: Home when cleared by cardiology Family Communication: NoFamily at bedside  Consultants: Cardiology  Code Status: FULL   DVT Prophylaxis: [place] Lovenox   Procedures: As Listed in Progress Note Above  Antibiotics: None    Subjective: Patient denies fevers, chills, headache, chest pain, dyspnea, nausea, vomiting, diarrhea, abdominal pain, dysuria, hematuria, hematochezia, and melena.   Objective: Vitals:   06/04/16 1300 06/04/16 2043 06/05/16 0555 06/05/16 1000  BP: 133/71 137/77 (!) 145/91 126/69  Pulse:  100 96   Resp:  20 20   Temp:  98.7 F (37.1 C) 98.1 F (36.7 C)   TempSrc:  Oral Oral   SpO2:  100% 100%   Weight:   67.4 kg (148 lb 9.6 oz)   Height:        Intake/Output Summary (Last 24 hours) at 06/05/16 1855 Last data filed at 06/05/16 1838  Gross per 24 hour  Intake              820 ml  Output             1200 ml  Net             -380 ml   Weight change: 0.272 kg (9.6 oz) Exam:   General:  Pt is alert, follows commands appropriately, not in acute distress  HEENT: No icterus, No thrush, No neck mass, [place]/AT  Cardiovascular: RRR, S1/S2, no rubs, no gallops  Respiratory: CTA bilaterally, no wheezing, no crackles, no rhonchi  Abdomen: Soft/+BS, non tender, non distended, no guarding  Extremities: No edema, No lymphangitis, No petechiae, No rashes, no synovitis   Data Reviewed: I have personally reviewed following labs and imaging studies Basic Metabolic Panel:  Recent Labs Lab 06/01/16 0229 06/02/16 0235 06/03/16 0451 06/04/16 0131 06/04/16 1139 06/05/16 0300  NA 141 143 141 138  --  141  K 3.7 3.7 4.1 4.1  --  4.2  CL 102 105 103 104  --  108  CO2 27 25 27 24   --  24  GLUCOSE 148* 106* 99 118*  --  111*  BUN 11 18 20  26*  --  17  CREATININE 0.97 1.03* 1.01* 0.96 0.92 0.81  CALCIUM 9.6 9.5 9.6 9.3  --  8.9  MG 1.9 2.2 2.3  --   --   --    Liver Function Tests:  Recent Labs Lab  05/31/16 1726  AST 40*  ALT 52*  ALKPHOS 77  BILITOT 0.8  PROT 7.1  ALBUMIN 4.2   No results for input(s): LIPASE, AMYLASE in the last 168 hours. No results for input(s): AMMONIA in the last 168 hours. Coagulation Profile:  Recent Labs Lab 06/04/16 0138  INR 1.04   CBC:  Recent Labs Lab 05/31/16 1735 05/31/16 2003 06/04/16 1139  WBC 8.5 8.2 7.7  HGB 12.2 11.9* 13.5  HCT 37.3* 38.1 43.0  MCV 67.0* 67.3* 67.9*  PLT  --  271 289   Cardiac Enzymes: No results for input(s): CKTOTAL, CKMB, CKMBINDEX, TROPONINI in the last 168 hours. BNP: Invalid input(s): POCBNP CBG: No results for input(s): GLUCAP in the last 168 hours. HbA1C: No results for input(s): HGBA1C in the last 72 hours. Urine analysis: No results found for: COLORURINE, APPEARANCEUR, LABSPEC, PHURINE, GLUCOSEU, HGBUR, BILIRUBINUR, KETONESUR, PROTEINUR, UROBILINOGEN, NITRITE, LEUKOCYTESUR Sepsis Labs: @LABRCNTIP (procalcitonin:4,lacticidven:4) )No results found for this or any previous visit (from the past 240 hour(s)).   Scheduled Meds: . aspirin EC  81 mg Oral Daily  . atorvastatin  40 mg Oral q1800  . cholecalciferol  1,000 Units Oral Daily  . digoxin  0.125 mg Oral Daily  . enoxaparin (LOVENOX) injection  40 mg Subcutaneous Q24H  . ferrous sulfate  325 mg Oral Q breakfast  . furosemide  40 mg Oral Daily  . multivitamin with minerals  1 tablet Oral Daily  . sacubitril-valsartan  1 tablet Oral BID  . sodium chloride flush  3 mL Intravenous Q12H  . sodium chloride flush  3 mL Intravenous Q12H  . spironolactone  12.5 mg Oral Daily   Continuous Infusions:  Procedures/Studies: Dg Chest 2 View  Result Date: 05/31/2016 CLINICAL DATA:  Cough and dyspnea for 2 weeks EXAM: CHEST  2 VIEW COMPARISON:  None. FINDINGS: The heart is enlarged. There is aortic atherosclerosis. There is mild diffuse interstitial prominence with minimal peribronchial thickening suggestive of small airway inflammation/ bronchitic  change. No pneumonic consolidation, effusion or pulmonary edema. No suspicious osseous lesions. IMPRESSION: Findings suggest mild bronchitic change of the lungs without pneumonic consolidation, CHF, effusion or pneumothorax. Electronically Signed   By: Tollie Eth M.D.   On: 05/31/2016 20:03   Ct Angio Chest Pe W And/or Wo Contrast  Result Date: 05/31/2016 CLINICAL DATA:  Shortness of breath for 3 weeks. No chest pain. Nonsmoker. EXAM: CT ANGIOGRAPHY CHEST WITH CONTRAST TECHNIQUE: Multidetector CT imaging of the chest was performed using the standard protocol during bolus administration of intravenous contrast. Multiplanar CT image reconstructions and MIPs were obtained to evaluate the vascular anatomy. CONTRAST:  70 mL Isovue 370 COMPARISON:  None. FINDINGS: Cardiovascular: Technically adequate study with good opacification of the central and segmental pulmonary arteries. No focal filling defects. No evidence of significant pulmonary embolus. Diffuse cardiac enlargement. Small pericardial effusion. Normal caliber thoracic aorta. Mediastinum/Nodes: Mediastinal lymph nodes are mildly prominent diffusely without  pathologic enlargement, likely reactive. Esophagus is decompressed. Small esophageal hiatal hernia. Lungs/Pleura: Small bilateral pleural effusions with basilar atelectasis, greater on the right. Diffuse interstitial pattern to the lungs consistent with interstitial edema. Diffuse bronchial wall thickening consistent with acute or chronic bronchitis. Airways are patent. No pneumothorax. Upper Abdomen: No acute abnormality. Musculoskeletal: Degenerative changes in the spine. No destructive bone lesions. Review of the MIP images confirms the above findings. IMPRESSION: No evidence of significant pulmonary embolus. Cardiac enlargement with bilateral pleural effusions and diffuse interstitial edema consistent with congestive failure. Bronchial wall thickening may indicate acute or chronic bronchitis.  Electronically Signed   By: Burman Nieves M.D.   On: 05/31/2016 23:56   Mr Cardiac Morphology W Wo Contrast  Result Date: 06/05/2016 CLINICAL DATA:  Cardiomhyopathy EXAM: CARDIAC MRI TECHNIQUE: The patient was scanned on a 1.5 Tesla GE magnet. A dedicated cardiac coil was used. Functional imaging was done using Fiesta sequences. 2,3, and 4 chamber views were done to assess for RWMA's. Modified Simpson's rule using a short axis stack was used to calculate an ejection fraction on a dedicated work [employer]. The patient received 23 cc of Multihance. After 10 minutes inversion recovery sequences were used to assess for infiltration and scar tissue. CONTRAST:  23 cc Multihance FINDINGS: Limited images of the lung fields showed no gross abnormalities. Small pericardial effusion. Normal left ventricular size with mild LV hypertrophy. EF 22%, diffuse hypokinesis. Normal right ventricular size and systolic function. Mild left atrial enlargement. Normal right atrium. No significant mitral regurgitation noted. Trileaflet aortic valve without significant stenosis or regurgitation. On delayed enhancement imaging, there was no myocardial late gadolinium enhancement (LGE) noted. MEASUREMENTS: MEASUREMENTS LV EDV 167 mL LV SV 36 mL LV EF 22% IMPRESSION: 1. Normal LV size with mild LV hypertrophy. EF 22% with diffuse hypokinesis. 2.  Normal RV size and systolic function. 3. No myocardial LGE, so no definitive evidence for prior MI, myocarditis, or infiltrative disease. Possible hypertensive cardiomyopathy. Dalton Mclean Electronically Signed   By: Marca Ancona M.D.   On: 06/05/2016 17:50    Gurshaan Matsuoka, DO  [facility] Pager [phone]  If 7PM-7AM, please contact night-coverage [email or web address] Password [email or web address] 06/05/2016, 6:55 PM   LOS: 3 days

## 2016-06-05 NOTE — Progress Notes (Signed)
Advanced Heart Failure Rounding Note   Subjective:    Continues to feel better. Denies DOE, orthopnea, PND, lightheadedness, or dizziness.   Echo reviewed personally by Dr. Gala RomneyBensimhon. EF 15-20% with moderate RV dysfunction.   R/LHC 06/04/16 Ao = 141/85 (105) LV =  141/11 RA = 4 RV = 29/1 PA = 34/12 (21) PCW = 8 Fick cardiac output/index = 2.6/1.6 Thermo CO/CI = 2.9/1.8 PVR = 5.0 WU SVR = 3087 FA sat = 95% PA sat = 42%, 44%  Assessment: 1. 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 Markedly depressed CO with severely elevated SVR 4. Low filling pressures  Objective:   Weight Range:  Vital Signs:   Temp:  [97.9 F (36.6 C)-98.7 F (37.1 C)] 98.1 F (36.7 C) (11/14 0555) Pulse Rate:  [0-100] 96 (11/14 0555) Resp:  [0-42] 20 (11/14 0555) BP: (125-163)/(71-109) 145/91 (11/14 0555) SpO2:  [0 %-100 %] 100 % (11/14 0555) Weight:  [148 lb 9.6 oz (67.4 kg)] 148 lb 9.6 oz (67.4 kg) (11/14 0555) Last BM Date: 06/04/16  Weight change: Filed Weights   06/03/16 0554 06/04/16 0455 06/05/16 0555  Weight: 146 lb 8 oz (66.5 kg) 148 lb (67.1 kg) 148 lb 9.6 oz (67.4 kg)    Intake/Output:   Intake/Output Summary (Last 24 hours) at 06/05/16 0807 Last data filed at 06/05/16 0626  Gross per 24 hour  Intake              360 ml  Output             1300 ml  Net             -940 ml     Physical Exam: General:  Well appearing.  NAD.  HEENT: Normal.  Neck: supple. JVP ~7-8 cm. Carotids 2+ bilat; no bruits. No thyromegaly or nodule noted.  Cor: PMI nondisplaced. RRR. 2/6 TR Lungs: CTAB, normal effort Abdomen: soft, NT, ND, no HSM. No bruits or masses. +BS  Extremities: no cyanosis, clubbing, rash, edema. Warm to the touch.  Neuro: alert & orientedx3, cranial nerves grossly intact. moves all 4 extremities w/o difficulty. Affect pleasant  Telemetry: Reviewed, NSR  Labs: Basic Metabolic Panel:  Recent  Labs Lab 06/01/16 0229 06/02/16 0235 06/03/16 0451 06/04/16 0131 06/04/16 1139 06/05/16 0300  NA 141 143 141 138  --  141  K 3.7 3.7 4.1 4.1  --  4.2  CL 102 105 103 104  --  108  CO2 27 25 27 24   --  24  GLUCOSE 148* 106* 99 118*  --  111*  BUN 11 18 20  26*  --  17  CREATININE 0.97 1.03* 1.01* 0.96 0.92 0.81  CALCIUM 9.6 9.5 9.6 9.3  --  8.9  MG 1.9 2.2 2.3  --   --   --     Liver Function Tests:  Recent Labs Lab 05/31/16 1726  AST 40*  ALT 52*  ALKPHOS 77  BILITOT 0.8  PROT 7.1  ALBUMIN 4.2   No results for input(s): LIPASE, AMYLASE in the last 168 hours. No results for input(s): AMMONIA in the last 168 hours.  CBC:  Recent Labs Lab 05/31/16 1735 05/31/16 2003 06/04/16 1139  WBC 8.5 8.2 7.7  HGB 12.2 11.9* 13.5  HCT 37.3* 38.1 43.0  MCV 67.0* 67.3* 67.9*  PLT  --  271 289    Cardiac Enzymes: No results for input(s): CKTOTAL, CKMB,  CKMBINDEX, TROPONINI in the last 168 hours.  BNP: BNP (last 3 results)  Recent Labs  05/31/16 2003  BNP 731.4*    ProBNP (last 3 results) No results for input(s): PROBNP in the last 8760 hours.    Other results:  Imaging: No results found.   Medications:     Scheduled Medications: . cholecalciferol  1,000 Units Oral Daily  . enoxaparin (LOVENOX) injection  40 mg Subcutaneous Q24H  . ferrous sulfate  325 mg Oral Q breakfast  . furosemide  40 mg Oral Daily  . multivitamin with minerals  1 tablet Oral Daily  . sacubitril-valsartan  1 tablet Oral BID  . sodium chloride flush  3 mL Intravenous Q12H  . sodium chloride flush  3 mL Intravenous Q12H  . spironolactone  12.5 mg Oral Daily    Infusions:   PRN Medications: sodium chloride, sodium chloride, acetaminophen, acetaminophen, ALPRAZolam, hydrALAZINE, ondansetron (ZOFRAN) IV, ondansetron (ZOFRAN) IV, sodium chloride flush, sodium chloride flush   Assessment:   1. Acute systolic HF with biventricular dysfunction - Echo 06/02/16 LVEF 15-20% Moderate  RV dysfunction - [facility] 06/04/16 with Moderate non-obstructive CAD with 60% mLAD lesion, Severe NICM EF ~25% suspect due to hypertensive CM, Markedly depressed CO with severely elevated SVR, and Low filling pressures. - Volume status stable. Continue lasix 40 mg daily.  - BB stopped with low output.  - Increase Entresto to 49/51 mg BID. Pursuing aggressive afterload reduction in effort to improve CO.  - Continue spiro 12.5 mg daily.  - Start digoxin 0.125 mg daily (ordered yesterday but was cancelled on transfer) - QRS 65ms  - Will recheck Echo in 3-4 months. If EF remains low will need ICD consideration.  2. Severe HTN - Remains mildly elevated. - Adjusting medications as above in setting of treating her HF.  3. CAD  --60% LAD lesion on cath. Needs statin and ASA   Would watch at least one more day with increase of Entresto. Will need dig level checked as outpatient.   Length of Stay: 3  Luane School 06/05/2016, 8:07 AM  Advanced Heart Failure Team Pager [phone] (M-F; 7a - 4p)  Please contact [facility] for night-coverage after hours (4p -7a ) and weekends on [email or web address]  Patient seen and examined with Otilio Saber, PA-C. We discussed all aspects of the encounter. I agree with the assessment and plan as stated above.   Cath results reviewed with her in detail. She has severe NICM with low output in setting of severe HTN. Will continue to titrate Entresto. Hold b-blocker for now. Start ASA and statin for CAD. Ambulate. Will have CR see. Check cMRI in am. Possibly home after.   Dhani Dannemiller,MD 11:15 AM

## 2016-06-05 NOTE — Progress Notes (Signed)
1440 Have tried to see pt twice but still in MRI. Will follow up tomorrow.Luetta Nutting RN BSN 06/05/2016 2:40 PM

## 2016-06-05 NOTE — Progress Notes (Signed)
Patient had a quiet night, no concerns or complaints expressed.  Groin site of cardiac cath within normal limits.  Patient looking forward to possible discharge home today.

## 2016-06-05 NOTE — Plan of Care (Signed)
Problem: Activity: Goal: Risk for activity intolerance will decrease Outcome: Completed/Met Date Met: 06/05/16 Ambulates in room without dyspnea or discomfort

## 2016-06-05 NOTE — Progress Notes (Signed)
Entresto coupon card given to patient with explanation of usage;B Christropher Gintz RN,MHA, BSN [phone] 

## 2016-06-06 DIAGNOSIS — D509 Iron deficiency anemia, unspecified: Secondary | ICD-10-CM

## 2016-06-06 DIAGNOSIS — I5041 Acute combined systolic (congestive) and diastolic (congestive) heart failure: Secondary | ICD-10-CM

## 2016-06-06 DIAGNOSIS — I5021 Acute systolic (congestive) heart failure: Secondary | ICD-10-CM | POA: Diagnosis not present

## 2016-06-06 DIAGNOSIS — I10 Essential (primary) hypertension: Secondary | ICD-10-CM | POA: Diagnosis not present

## 2016-06-06 LAB — BASIC METABOLIC PANEL
Anion gap: 8 (ref 5–15)
BUN: 18 mg/dL (ref 6–20)
CO2: 24 mmol/L (ref 22–32)
Calcium: 9 mg/dL (ref 8.9–10.3)
Chloride: 106 mmol/L (ref 101–111)
Creatinine, Ser: 0.79 mg/dL (ref 0.44–1.00)
GFR calc Af Amer: >60 mL/min (ref >60)
GFR calc non Af Amer: >60 mL/min (ref >60)
Glucose, Bld: 111 mg/dL — ABNORMAL HIGH (ref 65–99)
Potassium: 4.2 mmol/L (ref 3.5–5.1)
Sodium: 138 mmol/L (ref 135–145)

## 2016-06-06 MED ORDER — SACUBITRIL-VALSARTAN 97-103 MG PO TABS
1.0000 | ORAL_TABLET | Freq: Two times a day (BID) | ORAL | Status: DC
  Administered 2016-06-06: 1 via ORAL
  Filled 2016-06-06 (×2): qty 1

## 2016-06-06 MED ORDER — ATORVASTATIN CALCIUM 40 MG PO TABS: 40.0000 mg | ORAL_TABLET | Freq: Every day | ORAL | 0 refills | Status: DC

## 2016-06-06 MED ORDER — ASPIRIN 81 MG PO TBEC: 81.0000 mg | DELAYED_RELEASE_TABLET | Freq: Every day | ORAL | 0 refills | Status: DC

## 2016-06-06 MED ORDER — SACUBITRIL-VALSARTAN 97-103 MG PO TABS: 1.0000 | ORAL_TABLET | Freq: Two times a day (BID) | ORAL | 0 refills | Status: DC

## 2016-06-06 MED ORDER — SPIRONOLACTONE 25 MG PO TABS: 25.0000 mg | ORAL_TABLET | Freq: Every day | ORAL | 0 refills | Status: DC

## 2016-06-06 MED ORDER — FUROSEMIDE 40 MG PO TABS: 40.0000 mg | ORAL_TABLET | Freq: Every day | ORAL | 0 refills | Status: DC

## 2016-06-06 MED ORDER — DIGOXIN 125 MCG PO TABS: 0.1250 mg | ORAL_TABLET | Freq: Every day | ORAL | 0 refills | Status: DC

## 2016-06-06 MED ORDER — FERROUS SULFATE 325 (65 FE) MG PO TABS: 325.0000 mg | ORAL_TABLET | Freq: Two times a day (BID) | ORAL | 0 refills | Status: DC

## 2016-06-06 NOTE — Progress Notes (Signed)
Dc instructions given to pt at this time.  IV dc'd.  Pt awaiting daughter to pick her up around 1600.  Telemetry monitor left on for now until pt is ready to leave.

## 2016-06-06 NOTE — Progress Notes (Signed)
Advanced Heart Failure Rounding Note   Subjective:    Feeling better this am. Hasn't been walking halls.  States PTA she wasn't taking any medications for BP, or following with an MD regularly.   Echo reviewed personally by Dr. Gala RomneyBensimhon. EF 15-20% with moderate RV dysfunction.   cMRI reviewed personally by Dr. Gala RomneyBensimhon. EF 22%, diffuse hypokinesis. Normal RV. Mild LAE. Normal RA. No significant MR or AS/AI.  No LGE noted.  -> No definitive evidence for prior MI, myocarditis, or infiltrative disease.   R/LHC 06/04/16 Ao = 141/85 (105) LV =  141/11 RA = 4 RV = 29/1 PA = 34/12 (21) PCW = 8 Fick cardiac output/index = 2.6/1.6 Thermo CO/CI = 2.9/1.8 PVR = 5.0 WU SVR = 3087 FA sat = 95% PA sat = 42%, 44%  Assessment: 1. 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 Markedly depressed CO with severely elevated SVR 4. Low filling pressures  Objective:   Weight Range:  Vital Signs:   Temp:  [98.1 F (36.7 C)-98.6 F (37 C)] 98.6 F (37 C) (11/15 0541) Pulse Rate:  [98-100] 98 (11/15 0541) Resp:  [16-18] 16 (11/15 0541) BP: (126-152)/(69-99) 152/99 (11/15 0541) SpO2:  [100 %] 100 % (11/15 0541) Weight:  [148 lb 11.2 oz (67.4 kg)] 148 lb 11.2 oz (67.4 kg) (11/15 0541) Last BM Date: 06/04/16  Weight change: Filed Weights   06/04/16 0455 06/05/16 0555 06/06/16 0541  Weight: 148 lb (67.1 kg) 148 lb 9.6 oz (67.4 kg) 148 lb 11.2 oz (67.4 kg)    Intake/Output:   Intake/Output Summary (Last 24 hours) at 06/06/16 0838 Last data filed at 06/06/16 0540  Gross per 24 hour  Intake              700 ml  Output             1450 ml  Net             -750 ml     Physical Exam: General:  WDWN. NAD.  HEENT: Normal Neck: supple. JVP 6-7 cm. Carotids 2+ bilat; no bruits. No thyromegaly or nodule noted.  Cor: PMI nondisplaced. RRR. 2/6 TR Lungs: Clear, normal effort Abdomen: soft, NT, ND, no HSM. No bruits or  masses. +BS  Extremities: no cyanosis, clubbing, rash, edema. Warm to the touch.  Neuro: alert & orientedx3, cranial nerves grossly intact. moves all 4 extremities w/o difficulty. Affect pleasant  Telemetry: Reviewed, NSR  Labs: Basic Metabolic Panel:  Recent Labs Lab 06/01/16 0229 06/02/16 0235 06/03/16 0451 06/04/16 0131 06/04/16 1139 06/05/16 0300 06/06/16 0410  NA 141 143 141 138  --  141 138  K 3.7 3.7 4.1 4.1  --  4.2 4.2  CL 102 105 103 104  --  108 106  CO2 27 25 27 24   --  24 24  GLUCOSE 148* 106* 99 118*  --  111* 111*  BUN 11 18 20  26*  --  17 18  CREATININE 0.97 1.03* 1.01* 0.96 0.92 0.81 0.79  CALCIUM 9.6 9.5 9.6 9.3  --  8.9 9.0  MG 1.9 2.2 2.3  --   --   --   --     Liver Function Tests:  Recent Labs Lab 05/31/16 1726  AST 40*  ALT 52*  ALKPHOS 77  BILITOT 0.8  PROT 7.1  ALBUMIN 4.2   No results for input(s): LIPASE, AMYLASE in the last 168  hours. No results for input(s): AMMONIA in the last 168 hours.  CBC:  Recent Labs Lab 05/31/16 1735 05/31/16 2003 06/04/16 1139  WBC 8.5 8.2 7.7  HGB 12.2 11.9* 13.5  HCT 37.3* 38.1 43.0  MCV 67.0* 67.3* 67.9*  PLT  --  271 289    Cardiac Enzymes: No results for input(s): CKTOTAL, CKMB, CKMBINDEX, TROPONINI in the last 168 hours.  BNP: BNP (last 3 results)  Recent Labs  05/31/16 2003  BNP 731.4*    ProBNP (last 3 results) No results for input(s): PROBNP in the last 8760 hours.    Other results:  Imaging: Mr Cardiac Morphology W Wo Contrast  Result Date: 06/05/2016 CLINICAL DATA:  Cardiomhyopathy EXAM: CARDIAC MRI TECHNIQUE: The patient was scanned on a 1.5 Tesla GE magnet. A dedicated cardiac coil was used. Functional imaging was done using Fiesta sequences. 2,3, and 4 chamber views were done to assess for RWMA's. Modified Simpson's rule using a short axis stack was used to calculate an ejection fraction on a dedicated work [employer]. The patient received 23 cc of  Multihance. After 10 minutes inversion recovery sequences were used to assess for infiltration and scar tissue. CONTRAST:  23 cc Multihance FINDINGS: Limited images of the lung fields showed no gross abnormalities. Small pericardial effusion. Normal left ventricular size with mild LV hypertrophy. EF 22%, diffuse hypokinesis. Normal right ventricular size and systolic function. Mild left atrial enlargement. Normal right atrium. No significant mitral regurgitation noted. Trileaflet aortic valve without significant stenosis or regurgitation. On delayed enhancement imaging, there was no myocardial late gadolinium enhancement (LGE) noted. MEASUREMENTS: MEASUREMENTS LV EDV 167 mL LV SV 36 mL LV EF 22% IMPRESSION: 1. Normal LV size with mild LV hypertrophy. EF 22% with diffuse hypokinesis. 2.  Normal RV size and systolic function. 3. No myocardial LGE, so no definitive evidence for prior MI, myocarditis, or infiltrative disease. Possible hypertensive cardiomyopathy. Dalton Mclean Electronically Signed   By: Marca Ancona M.D.   On: 06/05/2016 17:50     Medications:     Scheduled Medications: . aspirin EC  81 mg Oral Daily  . atorvastatin  40 mg Oral q1800  . cholecalciferol  1,000 Units Oral Daily  . digoxin  0.125 mg Oral Daily  . enoxaparin (LOVENOX) injection  40 mg Subcutaneous Q24H  . ferrous sulfate  325 mg Oral Q breakfast  . furosemide  40 mg Oral Daily  . multivitamin with minerals  1 tablet Oral Daily  . sacubitril-valsartan  1 tablet Oral BID  . sodium chloride flush  3 mL Intravenous Q12H  . sodium chloride flush  3 mL Intravenous Q12H  . spironolactone  12.5 mg Oral Daily    Infusions:   PRN Medications: sodium chloride, sodium chloride, acetaminophen, acetaminophen, ALPRAZolam, hydrALAZINE, ondansetron (ZOFRAN) IV, ondansetron (ZOFRAN) IV, sodium chloride flush, sodium chloride flush   Assessment:   1. 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 - cMRI 06/05/16 with EF 22% and no evidence of prior MI, myocarditis, or infiltrative disease.  Very likely due to prolonged HTN.  - Volume status stable on lasix 40 mg daily. Will continue.  - BB stopped with low output.  - Increase Entresto to 97/103 mg BID. Pursuing aggressive afterload reduction in effort to improve  CO.  - Continue spiro 12.5 mg daily.  - Continue digoxin 0.125 mg daily (ordered yesterday but was cancelled on transfer) - QRS 44ms  - Will recheck Echo in 3-4 months. If EF remains low will need ICD consideration.  2. Severe HTN - Remains elevated into 150s. - Adjusting medications as above in setting of treating her HF. - No BB with low output.   3. CAD  --60% LAD lesion on cath.  - Continue atorvastatin 40 mg and ASA 81 mg daily.  Entresto to goal dose today. OK for home from HF perspective. Will schedule follow up.   HF meds for home Entresto 97/103 mg BID Spiro 12.5 mg daily Digoxin 0.125 mg daily Atorvastatin 40 mg daily ASA 81 mg daily NO BETA BLOCKER with low output   Length of Stay: 4  Graciella Freer, PA-C 06/06/2016, 8:38 AM  Advanced Heart Failure Team Pager [phone] (M-F; 7a - 4p)  Please contact [facility] for night-coverage after hours (4p -7a ) and weekends on [email or web address]  Patient seen and examined with Otilio Saber, PA-C. We discussed all aspects of the encounter. I agree with the assessment and plan as stated above.   cMRI reviewed personally. EF 22%. RV normla. No infiltrative process. Agree with d/c home today with meds as above. F/u closely in HF clinic.  Mykel Mohl,MD 12:39 PM

## 2016-06-06 NOTE — Progress Notes (Signed)
CARDIAC REHAB PHASE I   PRE:  Rate/Rhythm: 98 SR  BP:  Supine:   Sitting: 147/80  Standing:    SaO2: 94%RA  MODE:  Ambulation: 460 ft   POST:  Rate/Rhythm: 108 ST  BP:  Supine:   Sitting: 162/88  Standing:    SaO2: 99%RA 1040-1120 Pt walked 460 ft on RA with steady gait and tolerated well. Pt could answer teach back re ed just done by CHF RN Navigator.. Discussed ex ed and CRP 2. Pt cannot attend CRP 2 due to work schedule. Gave brochure in case anything changes in the future and her hours allow her to attend. Discussed 2000 mg sodium restriction.   Luetta Nutting, RN BSN  06/06/2016 11:16 AM

## 2016-06-06 NOTE — Progress Notes (Signed)
Heart Failure Navigator Consult Note  Presentation: per H&P Christine Wilson is a 55 y.o. female with medical history significant for hypertension who stopped taking medications for this almost 15 years ago and now presents to the ED with 2 weeks of progressive dyspnea, nonproductive cough, and fatigue. Patient reports that she typically enjoys good health but has not seen a physician in at least 5 years. She had been in her usual state until the insidious development of dyspnea on exertion with nonproductive cough and fatigue for approximately 2 weeks ago. Over the ensuing days, her condition is continued to worsen progressively and she has become dyspneic with minimal exertion. Patient is also noted that she is unable to lie on her back due to worsening in her shortness of breath. She denies any swelling of the feet or legs and denies any chest pain or palpitations. She denies any history of heart disease and is a nonsmoker. There has been no recent fevers or chills, on distance travel, or sick contacts.   Past Medical History:  Diagnosis Date  . Hypertension     Social History   Social History  . Marital status: Divorced    Spouse name: N/A  . Number of children: N/A  . Years of education: N/A   Social History Main Topics  . Smoking status: Never Smoker  . Smokeless tobacco: Never Used  . Alcohol use No  . Drug use: No  . Sexual activity: Not Asked   Other Topics Concern  . None   Social History Narrative  . None    ECHO:Study Conclusions--06/02/16  - Left ventricle: The cavity size was normal. Wall thickness was   normal. Systolic function was severely reduced. The estimated   ejection fraction was in the range of 15% to 20%. Diffuse   hypokinesis. Doppler parameters are consistent with abnormal left   ventricular relaxation (grade 1 diastolic dysfunction). - Mitral valve: There was mild regurgitation. - Right ventricle: Systolic function was mildly to moderately    reduced.  ------------------------------------------------------------------- Study data:  No prior study was available for comparison.  Study status:  Routine.  Procedure:  The patient reported no pain pre or post test. Transthoracic echocardiography. Image quality was adequate.  Study completion:  There were no complications. Transthoracic echocardiography.  M-mode, complete 2D, spectral Doppler, and color Doppler.  Birthdate:  Patient birthdate: [birth year].  Age:  Patient is 55 yr old.  Sex:  Gender: female. BMI: 27.8 kg/m^2.  Blood pressure:     138/72  Patient status: Inpatient.  Study date:  Study date: 06/02/2016. Study time: 03:10 PM.  Location:  Echo laboratory.   R/LHC 06/04/16 Ao = 141/85 (105) LV = 141/11 RA = 4 RV = 29/1 PA = 34/12 (21) PCW = 8 Fick cardiac output/index = 2.6/1.6 Thermo CO/CI = 2.9/1.8 PVR = 5.0 WU SVR = 3087 FA sat = 95% PA sat = 42%, 44%  Assessment: 1. 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 Markedly depressed CO with severely elevated SVR 4. Low filling pressures BNP    Component Value Date/Time   BNP 731.4 (H) 05/31/2016 2003    ProBNP No results found for: PROBNP   Education Assessment and Provision:  Detailed education and instructions provided on heart failure disease management including the following:  Signs and symptoms of Heart Failure When to call the physician Importance of daily weights Low sodium diet Fluid restriction Medication management Anticipated future follow-up appointments  Patient education given on each of the above topics.  Patient acknowledges understanding and acceptance of all instructions.   I spoke with Christine Wilson regarding her new HF and current hospitalization.  She has a scale at home and we reviewed the importance of daily weights/ how weight increases relate to the signs and symptoms of HF.  I also reinforced a low sodium  diet and high sodium foods to avoid as well as reading/interpreting food labels.  She denies any issues with getting or taking prescribed medications --yet is somewhat unsure how "good her insurance is" and was not taking any meds on admission.  She will follow in the AHF Clinic and I have provided her a map/directions to the HF Clinic.   Education Materials:  "Living Better With Heart Failure" Booklet, Daily Weight Tracker Tool   High Risk Criteria for Readmission and/or Poor Patient Outcomes:  (Recommend Follow-up with Advanced Heart Failure Clinic)--yes    EF <30%- 15-20%  2 or more admissions in 6 months- 1/4851mo  Difficult social situation- No denies  Demonstrates medication noncompliance-   Barriers of Care:  New HF, Knowledge and compliance  Discharge Planning:   Plans to return to home with adult daughter.

## 2016-06-06 NOTE — Discharge Summary (Signed)
Physician Discharge Summary  Christine Wilson  ZOX:[id number]  DOB: [birth year]  DOA: 05/31/2016 PCP: No PCP Per Patient  Admit date: 05/31/2016 Discharge date: 06/06/2016  Admitted From: Home Disposition:  Home   Recommendations for Outpatient Follow-up:  1. Follow up with PCP in 1-2 weeks 2. Please obtain BMP/CBC in one week 3. Follow-up with cardiology in 1-2 weeks  Home Health: None  Equipment/Devices: None  Discharge Condition: Stable CODE STATUS: Full Diet recommendation: Heart Healthy / Carb Modified   Brief/Interim Summary: 55 year old female with history of hypertension presented with two-week history of deep dyspnea on exertion and shortness of breath with nonproductive cough. Patient stated that she has some been seen by a physician for a long time to stop taking her medications. Upon presentation the patient was found to have a blood pressure of 205/120. CT angiogram of the chest was performed which was negative for pulmonary embolism but show some interstitial edema with bilateral pleural effusions. Patient was admitted with acute CHF and uncontrolled hypertension patient was started on IV Lasix. Echo was done which show 15-20% of ejection fraction grade 1 diastolic dysfunction. Cardiology was consulted. Right heart and left heart catheterization was done showed moderate nonobstructive CAD with 60% and LAD lesion. Representing hypertensive cardiomyopathy. Cardiac MRI was done which showed EF of 22% diffuse hypokinesis normal right ventricular function, but no evidence of prior MIs, myocarditis or infiltrative disease. Patient was started on Entresto, spironolactone and digoxin. Patient tolerated well medications that were titrated up. She has significantly improve and was clear by cardiology to go home on the above medications. She will need to recheck an echo in 3-4 month if EF remains low she will need an AICD. Patient was advised to follow-up with primary care doctor and heart  clinic failure. He was recommended for patient to continue medical therapy as prescribed. Discussed importance of good diet and medical compliance to avoid hospitalization.  Subjective: Patient seen and examined. Patient out of bed to chair. Has no complaints today. Denies chest pain, shortness of breath, palpitation. Patient wants to go home. Remains afebrile, tolerating diet. 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) - stable, she had good diuresis ~ 7 L during admission Continue Lasix is 40 mg daily Continue Entresto twice a day titrate as tolerated Continue Aldactone will increase to 25 daily as BP in the upper limits  Continue the digoxin 0.125 mg daily Follow-up with heart failure clinic Repeat in echo in 3-4 month  Hypertension - improving On Entresto Aldactone and Lasix No BB given low output   Microcytic anemia - secondary to iron deficiency Iron supplement 325 twice a day  CAD - LAD lesion 60% occluded Continue Lipitor 40 mg daily Continue aspirin 81 mg daily Follow-up with cardiology  Discharge Instructions  Discharge Instructions    (HEART FAILURE PATIENTS) Call MD:  Anytime you have any of the following symptoms: 1) 3 pound weight gain in 24 hours or 5 pounds in 1 week 2) shortness of breath, with or without a dry hacking cough 3) swelling in the hands, feet or stomach 4) if you have to sleep on extra pillows at night in order to breathe.    Complete by:  As directed    Avoid straining    Complete by:  As directed    Call MD for:  difficulty breathing, headache or visual disturbances    Complete by:  As directed    Call MD for:  extreme fatigue    Complete by:  As directed    Call MD for:  hives    Complete by:  As directed    Call MD for:  persistant dizziness or light-headedness    Complete by:  As directed    Call MD for:  persistant nausea and vomiting    Complete by:  As directed    Call MD  for:  redness, tenderness, or signs of infection (pain, swelling, redness, odor or green/yellow discharge around incision site)    Complete by:  As directed    Call MD for:  severe uncontrolled pain    Complete by:  As directed    Call MD for:  temperature >100.4    Complete by:  As directed    Diet - low sodium heart healthy    Complete by:  As directed    Discharge instructions    Complete by:  As directed    Low salt diet  Restrict fluid intake to 2 L per day   Heart Failure patients record your daily weight using the same scale at the same time of day    Complete by:  As directed    Increase activity slowly    Complete by:  As directed    STOP any activity that causes chest pain, shortness of breath, dizziness, sweating, or exessive weakness    Complete by:  As directed        Medication List    TAKE these medications   aspirin 81 MG EC tablet Take 1 tablet (81 mg total) by mouth daily. Start taking on:  06/07/2016   atorvastatin 40 MG tablet Commonly known as:  LIPITOR Take 1 tablet (40 mg total) by mouth daily at 6 PM.   BIOTIN FORTE PO Take 1 tablet by mouth daily as needed (takes occassionaly).   cholecalciferol 1000 units tablet Commonly known as:  VITAMIN D Take 1,000 Units by mouth daily.   digoxin 0.125 MG tablet Commonly known as:  LANOXIN Take 1 tablet (0.125 mg total) by mouth daily. Start taking on:  06/07/2016   ferrous sulfate 325 (65 FE) MG tablet Take 1 tablet (325 mg total) by mouth 2 (two) times daily with a meal.   furosemide 40 MG tablet Commonly known as:  LASIX Take 1 tablet (40 mg total) by mouth daily. Start taking on:  06/07/2016   Garlic 10 MG Caps Take 10 mg by mouth daily.   multivitamin with minerals tablet Take 1 tablet by mouth daily.   POTASSIMIN PO Take 1 tablet by mouth daily.   sacubitril-valsartan 97-103 MG Commonly known as:  ENTRESTO Take 1 tablet by mouth 2 (two) times daily.   spironolactone 25 MG  tablet Commonly known as:  ALDACTONE Take 1 tablet (25 mg total) by mouth daily. Start taking on:  06/07/2016   vitamin E 100 UNIT capsule Take by mouth daily.      Follow-up Information    Alysia Penna, NP Follow up on 06/29/2016.   Specialty:  Internal Medicine Why:  at 11:30 am; please try to keep your apt or call to reschedule. Contact information: [address] [place] [address] [phone]        Arvilla Meres, MD Follow up on 06/20/2016.   Specialty:  Cardiology Why:  at 1140 for post hospital follow up. Please bring all of your medications to your visit. The code for parking is 0004.   [employer] through [employer] entrance off of [address]. Parking  garage is on the right. Contact information: [address] [place] [address] [phone]          No Known Allergies  Consultations:  Cardiology    Procedures/Studies: Dg Chest 2 View  Result Date: 05/31/2016 CLINICAL DATA:  Cough and dyspnea for 2 weeks EXAM: CHEST  2 VIEW COMPARISON:  None. FINDINGS: The heart is enlarged. There is aortic atherosclerosis. There is mild diffuse interstitial prominence with minimal peribronchial thickening suggestive of small airway inflammation/ bronchitic change. No pneumonic consolidation, effusion or pulmonary edema. No suspicious osseous lesions. IMPRESSION: Findings suggest mild bronchitic change of the lungs without pneumonic consolidation, CHF, effusion or pneumothorax. Electronically Signed   By: Tollie Eth M.D.   On: 05/31/2016 20:03   Ct Angio Chest Pe W And/or Wo Contrast  Result Date: 05/31/2016 CLINICAL DATA:  Shortness of breath for 3 weeks. No chest pain. Nonsmoker. EXAM: CT ANGIOGRAPHY CHEST WITH CONTRAST TECHNIQUE: Multidetector CT imaging of the chest was performed using the standard protocol during bolus administration of intravenous contrast. Multiplanar CT image reconstructions and MIPs were obtained to evaluate the vascular  anatomy. CONTRAST:  70 mL Isovue 370 COMPARISON:  None. FINDINGS: Cardiovascular: Technically adequate study with good opacification of the central and segmental pulmonary arteries. No focal filling defects. No evidence of significant pulmonary embolus. Diffuse cardiac enlargement. Small pericardial effusion. Normal caliber thoracic aorta. Mediastinum/Nodes: Mediastinal lymph nodes are mildly prominent diffusely without pathologic enlargement, likely reactive. Esophagus is decompressed. Small esophageal hiatal hernia. Lungs/Pleura: Small bilateral pleural effusions with basilar atelectasis, greater on the right. Diffuse interstitial pattern to the lungs consistent with interstitial edema. Diffuse bronchial wall thickening consistent with acute or chronic bronchitis. Airways are patent. No pneumothorax. Upper Abdomen: No acute abnormality. Musculoskeletal: Degenerative changes in the spine. No destructive bone lesions. Review of the MIP images confirms the above findings. IMPRESSION: No evidence of significant pulmonary embolus. Cardiac enlargement with bilateral pleural effusions and diffuse interstitial edema consistent with congestive failure. Bronchial wall thickening may indicate acute or chronic bronchitis. Electronically Signed   By: Burman Nieves M.D.   On: 05/31/2016 23:56   Mr Cardiac Morphology W Wo Contrast  Result Date: 06/05/2016 CLINICAL DATA:  Cardiomhyopathy EXAM: CARDIAC MRI TECHNIQUE: The patient was scanned on a 1.5 Tesla GE magnet. A dedicated cardiac coil was used. Functional imaging was done using Fiesta sequences. 2,3, and 4 chamber views were done to assess for RWMA's. Modified Simpson's rule using a short axis stack was used to calculate an ejection fraction on a dedicated work [employer]. The patient received 23 cc of Multihance. After 10 minutes inversion recovery sequences were used to assess for infiltration and scar tissue. CONTRAST:  23 cc Multihance  FINDINGS: Limited images of the lung fields showed no gross abnormalities. Small pericardial effusion. Normal left ventricular size with mild LV hypertrophy. EF 22%, diffuse hypokinesis. Normal right ventricular size and systolic function. Mild left atrial enlargement. Normal right atrium. No significant mitral regurgitation noted. Trileaflet aortic valve without significant stenosis or regurgitation. On delayed enhancement imaging, there was no myocardial late gadolinium enhancement (LGE) noted. MEASUREMENTS: MEASUREMENTS LV EDV 167 mL LV SV 36 mL LV EF 22% IMPRESSION: 1. Normal LV size with mild LV hypertrophy. EF 22% with diffuse hypokinesis. 2.  Normal RV size and systolic function. 3. No myocardial LGE, so no definitive evidence for prior MI, myocarditis, or infiltrative disease. Possible hypertensive cardiomyopathy. [employer] Electronically Signed   By: Montel Clock.D.  On: 06/05/2016 17:50   Echo 06/02/2016 ------------------------------------------------------------------- Study Conclusions  - Left ventricle: The cavity size was normal. Wall thickness was   normal. Systolic function was severely reduced. The estimated   ejection fraction was in the range of 15% to 20%. Diffuse   hypokinesis. Doppler parameters are consistent with abnormal left   ventricular relaxation (grade 1 diastolic dysfunction). - Mitral valve: There was mild regurgitation. - Right ventricle: Systolic function was mildly to moderately   reduced.  Cardiac Cath 06/04/2016  Dist LAD lesion, 60 %stenosed.   Discharge Exam: Vitals:   06/05/16 1954 06/06/16 0541  BP: (!) 151/87 (!) 152/99  Pulse: 100 98  Resp: 18 16  Temp: 98.1 F (36.7 C) 98.6 F (37 C)   Vitals:   06/05/16 0555 06/05/16 1000 06/05/16 1954 06/06/16 0541  BP: (!) 145/91 126/69 (!) 151/87 (!) 152/99  Pulse: 96  100 98  Resp: 20  18 16   Temp: 98.1 F (36.7 C)  98.1 F (36.7 C) 98.6 F (37 C)  TempSrc: Oral  Oral Oral  SpO2: 100%   100% 100%  Weight: 67.4 kg (148 lb 9.6 oz)   67.4 kg (148 lb 11.2 oz)  Height:        General: Pt is alert, awake, not in acute distress Cardiovascular: RRR, S1/S2 +, no rubs, no gallops Respiratory: CTA bilaterally, no wheezing, no rhonchi Abdominal: Soft, NT, ND, bowel sounds + Extremities: no edema, no cyanosis    The results of significant diagnostics from this hospitalization (including imaging, microbiology, ancillary and laboratory) are listed below for reference.     Microbiology: No results found for this or any previous visit (from the past 240 hour(s)).   Labs: BNP (last 3 results)  Recent Labs  05/31/16 2003  BNP 731.4*   Basic Metabolic Panel:  Recent Labs Lab 06/01/16 0229 06/02/16 0235 06/03/16 0451 06/04/16 0131 06/04/16 1139 06/05/16 0300 06/06/16 0410  NA 141 143 141 138  --  141 138  K 3.7 3.7 4.1 4.1  --  4.2 4.2  CL 102 105 103 104  --  108 106  CO2 27 25 27 24   --  24 24  GLUCOSE 148* 106* 99 118*  --  111* 111*  BUN 11 18 20  26*  --  17 18  CREATININE 0.97 1.03* 1.01* 0.96 0.92 0.81 0.79  CALCIUM 9.6 9.5 9.6 9.3  --  8.9 9.0  MG 1.9 2.2 2.3  --   --   --   --    Liver Function Tests:  Recent Labs Lab 05/31/16 1726  AST 40*  ALT 52*  ALKPHOS 77  BILITOT 0.8  PROT 7.1  ALBUMIN 4.2   No results for input(s): LIPASE, AMYLASE in the last 168 hours. No results for input(s): AMMONIA in the last 168 hours. CBC:  Recent Labs Lab 05/31/16 1735 05/31/16 2003 06/04/16 1139  WBC 8.5 8.2 7.7  HGB 12.2 11.9* 13.5  HCT 37.3* 38.1 43.0  MCV 67.0* 67.3* 67.9*  PLT  --  271 289   Cardiac Enzymes: No results for input(s): CKTOTAL, CKMB, CKMBINDEX, TROPONINI in the last 168 hours. BNP: Invalid input(s): POCBNP CBG: No results for input(s): GLUCAP in the last 168 hours. D-Dimer No results for input(s): DDIMER in the last 72 hours. Hgb A1c No results for input(s): HGBA1C in the last 72 hours. Lipid Profile No results for input(s):  CHOL, HDL, LDLCALC, TRIG, CHOLHDL, LDLDIRECT in the last 72 hours. Thyroid function studies No  results for input(s): TSH, T4TOTAL, T3FREE, THYROIDAB in the last 72 hours.  Invalid input(s): FREET3 Anemia work up No results for input(s): VITAMINB12, FOLATE, FERRITIN, TIBC, IRON, RETICCTPCT in the last 72 hours. Urinalysis No results found for: COLORURINE, APPEARANCEUR, LABSPEC, PHURINE, GLUCOSEU, HGBUR, BILIRUBINUR, KETONESUR, PROTEINUR, UROBILINOGEN, NITRITE, LEUKOCYTESUR Sepsis Labs Invalid input(s): PROCALCITONIN,  WBC,  LACTICIDVEN Microbiology No results found for this or any previous visit (from the past 240 hour(s)).   Time coordinating discharge: Over 30 minutes  SIGNED:  Latrelle Dodrill, MD  [facility] 06/06/2016, 12:18 PM Pager   If 7PM-7AM, please contact night-coverage [email or web address] Password [email or web address]

## 2016-06-20 ENCOUNTER — Encounter (HOSPITAL_COMMUNITY): Payer: Self-pay | Admitting: Internal Medicine

## 2016-06-20 ENCOUNTER — Ambulatory Visit (HOSPITAL_COMMUNITY)
Admission: RE | Admit: 2016-06-20 | Discharge: 2016-06-20 | Disposition: A | Discharge status: Home / Self Care | Payer: PRIVATE HEALTH INSURANCE | Source: Ambulatory Visit | Attending: Internal Medicine | Admitting: Internal Medicine

## 2016-06-20 ENCOUNTER — Encounter (HOSPITAL_COMMUNITY): Payer: Self-pay | Admitting: *Deleted

## 2016-06-20 VITALS — BP 154/77 | HR 73 | Wt 149.0 lb

## 2016-06-20 DIAGNOSIS — I10 Essential (primary) hypertension: Secondary | ICD-10-CM

## 2016-06-20 DIAGNOSIS — I5041 Acute combined systolic (congestive) and diastolic (congestive) heart failure: Secondary | ICD-10-CM | POA: Diagnosis not present

## 2016-06-20 DIAGNOSIS — I2510 Atherosclerotic heart disease of native coronary artery without angina pectoris: Secondary | ICD-10-CM | POA: Insufficient documentation

## 2016-06-20 DIAGNOSIS — I110 Hypertensive heart disease with heart failure: Secondary | ICD-10-CM | POA: Insufficient documentation

## 2016-06-20 DIAGNOSIS — Z7982 Long term (current) use of aspirin: Secondary | ICD-10-CM | POA: Diagnosis not present

## 2016-06-20 DIAGNOSIS — I5022 Chronic systolic (congestive) heart failure: Secondary | ICD-10-CM | POA: Diagnosis present

## 2016-06-20 DIAGNOSIS — Z79899 Other long term (current) drug therapy: Secondary | ICD-10-CM | POA: Insufficient documentation

## 2016-06-20 LAB — BASIC METABOLIC PANEL
Anion gap: 9 (ref 5–15)
BUN: 15 mg/dL (ref 6–20)
CO2: 27 mmol/L (ref 22–32)
Calcium: 9.8 mg/dL (ref 8.9–10.3)
Chloride: 103 mmol/L (ref 101–111)
Creatinine, Ser: 0.87 mg/dL (ref 0.44–1.00)
GFR calc Af Amer: >60 mL/min (ref >60)
GFR calc non Af Amer: >60 mL/min (ref >60)
Glucose, Bld: 100 mg/dL — ABNORMAL HIGH (ref 65–99)
Potassium: 4.5 mmol/L (ref 3.5–5.1)
Sodium: 139 mmol/L (ref 135–145)

## 2016-06-20 MED ORDER — ISOSORB DINITRATE-HYDRALAZINE 20-37.5 MG PO TABS: 0.5000 | ORAL_TABLET | Freq: Three times a day (TID) | ORAL | 3 refills | Status: DC

## 2016-06-20 NOTE — Patient Instructions (Signed)
Labs today (will call for abnormal results, otherwise no news is good news)  Start taking 1/2 Tab of Bidil 3 times Daily  Follow up in 3 weeks with Cicero Duck, PharMD  Follow up in 2 months

## 2016-06-20 NOTE — Progress Notes (Signed)
ADVANCED HF CLINIC NOTE   Primary Cardiologist: Finola Rosal  HPI:  Elita Quick is a 55 year old female with history of hypertension and recently diagnosed systolic HF presents for post-hospital f/u.   Admitted in 11/17 with two-week of DOE. In ER, was found to have a blood pressure of 205/120. CT angiogram of the chest was performed which was negative for pulmonary embolism but show some interstitial edema with bilateral pleural effusions. Patient was admitted with acute CHF and uncontrolled hypertension patient was started on IV Lasix. Echo was done which show 15-20% of ejection fraction grade 1 diastolic dysfunction.   Right heart and left heart catheterization was done showed moderate nonobstructive CAD with 60% LAD lesion with borderline CO.  Cardiac MRI showed EF of 22% diffuse hypokinesis normal right ventricular function, but no evidence of prior MIs, myocarditis or infiltrative disease.  She was diuresed and started on Entresto, spironolactone and digoxin.  She returns for post-hospital f/u. Feels good. Able to do all ADLs and housework without any problem. No problem with going to store or walking up steps. No edema, orthopnea or PND. Compliant with meds. Weight stable. 149-151. Not watching fluid intake that closely. BP still high. No dizziness. SBP at home 127-173 (average about 150)  Cath 06/04/16  Findings:  Ao = 141/85 (105) LV =  141/11 RA = 4 RV = 29/1 PA = 34/12 (21) PCW = 8 Fick cardiac output/index = 2.6/1.6 Thermo CO/CI = 2.9/1.8 PVR = 5.0 WU SVR = 3087 FA sat = 95% PA sat = 42%, 44%  Assessment: 1. 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 Markedly depressed CO with severely elevated SVR 4. Low filling pressures     Past Medical History:  Diagnosis Date  . Hypertension     Current Outpatient Prescriptions  Medication Sig Dispense Refill  . aspirin EC 81 MG EC tablet Take 1 tablet (81 mg  total) by mouth daily. 30 tablet 0  . atorvastatin (LIPITOR) 40 MG tablet Take 1 tablet (40 mg total) by mouth daily at 6 PM. 30 tablet 0  . BIOTIN FORTE PO Take 1 tablet by mouth daily as needed (takes occassionaly).     . cholecalciferol (VITAMIN D) 1000 units tablet Take 1,000 Units by mouth daily.    . digoxin (LANOXIN) 0.125 MG tablet Take 1 tablet (0.125 mg total) by mouth daily. 30 tablet 0  . ferrous sulfate 325 (65 FE) MG tablet Take 1 tablet (325 mg total) by mouth 2 (two) times daily with a meal. 60 tablet 0  . furosemide (LASIX) 40 MG tablet Take 1 tablet (40 mg total) by mouth daily. 30 tablet 0  . Garlic 10 MG CAPS Take 10 mg by mouth daily.     . Multiple Vitamins-Minerals (MULTIVITAMIN WITH MINERALS) tablet Take 1 tablet by mouth daily.    . Potassium (POTASSIMIN PO) Take 1 tablet by mouth daily.    . sacubitril-valsartan (ENTRESTO) 97-103 MG Take 1 tablet by mouth 2 (two) times daily. 60 tablet 0  . spironolactone (ALDACTONE) 25 MG tablet Take 1 tablet (25 mg total) by mouth daily. 30 tablet 0  . vitamin E 100 UNIT capsule Take by mouth daily.     No current facility-administered medications for this encounter.     No Known Allergies    Social History   Social History  . Marital status: Divorced    Spouse name: N/A  . Number of children: N/A  .  Years of education: N/A   Occupational History  . Not on file.   Social History Main Topics  . Smoking status: Never Smoker  . Smokeless tobacco: Never Used  . Alcohol use No  . Drug use: No  . Sexual activity: Not on file   Other Topics Concern  . Not on file   Social History Narrative  . No narrative on file      Family History  Problem Relation Age of Onset  . Diabetes Mother   . Hypertension Mother   . Heart disease Mother [id number]    a. Passed away due to MI  . Hypertension Father   . Diabetes Father   . Diabetes Brother   . Cancer Maternal Grandmother   . Hypertension Maternal Grandmother     Vitals:     06/20/16 1140  BP: (!) 154/77  Pulse: 73  SpO2: 100%  Weight: 149 lb (67.6 kg)    PHYSICAL EXAM: General:  Well appearing. No respiratory difficulty HEENT: normal Neck: supple. no JVD. Carotids 2+ bilat; no bruits. No lymphadenopathy or thryomegaly appreciated. Cor: PMI nondisplaced. Regular rate & rhythm. No rubs, gallops or murmurs. Lungs: clear Abdomen: soft, nontender, nondistended. No hepatosplenomegaly. No bruits or masses. Good bowel sounds. Extremities: no cyanosis, clubbing, rash, edema Neuro: alert & oriented x 3, cranial nerves grossly intact. moves all 4 extremities w/o difficulty. Affect pleasant.   ASSESSMENT & PLAN: 1. Chronic systolic HF --onset 11/17. EF 15-20% (11/17). cMRI EF 22%. Cath 11/17 nonobstructive CAD --suspect HTN CM --NYHA II --Volume status ok. --Continue Entresto 97/103 bid, spiro 25 daily, lasix 40 daily and digoxin --no b-blocker yet due to recent low output --start Bidil 0.5 tab tid 2. HTN, uncontrolled --BP still up. Adding Bidil. 3. CAD, nonobstructive --No CP. Continue ASA 81 and atorva  Overall improved. Check labs today. F/u 2-3 weeks with PharmD to titrate meds. Consider addition of carvedilol +/- increasing Bidil.   Seylah Wernert,MD 10:57 PM

## 2016-06-29 ENCOUNTER — Encounter: Payer: Self-pay | Admitting: Nurse Practitioner

## 2016-06-29 ENCOUNTER — Other Ambulatory Visit (HOSPITAL_COMMUNITY)
Admission: RE | Admit: 2016-06-29 | Discharge: 2016-06-29 | Disposition: A | Discharge status: Home / Self Care | Payer: No Typology Code available for payment source | Source: Ambulatory Visit | Attending: Internal Medicine | Admitting: Internal Medicine

## 2016-06-29 ENCOUNTER — Ambulatory Visit: Payer: PRIVATE HEALTH INSURANCE | Admitting: Nurse Practitioner

## 2016-06-29 ENCOUNTER — Ambulatory Visit (INDEPENDENT_AMBULATORY_CARE_PROVIDER_SITE_OTHER): Payer: PRIVATE HEALTH INSURANCE | Admitting: Nurse Practitioner

## 2016-06-29 ENCOUNTER — Other Ambulatory Visit: Payer: PRIVATE HEALTH INSURANCE

## 2016-06-29 VITALS — BP 158/88 | HR 77 | Temp 97.5°F | Ht 61.0 in | Wt 149.0 lb

## 2016-06-29 DIAGNOSIS — D509 Iron deficiency anemia, unspecified: Secondary | ICD-10-CM

## 2016-06-29 DIAGNOSIS — Z0000 Encounter for general adult medical examination without abnormal findings: Secondary | ICD-10-CM | POA: Diagnosis not present

## 2016-06-29 DIAGNOSIS — I160 Hypertensive urgency: Secondary | ICD-10-CM | POA: Diagnosis not present

## 2016-06-29 DIAGNOSIS — I5041 Acute combined systolic (congestive) and diastolic (congestive) heart failure: Secondary | ICD-10-CM

## 2016-06-29 DIAGNOSIS — Z1231 Encounter for screening mammogram for malignant neoplasm of breast: Secondary | ICD-10-CM

## 2016-06-29 DIAGNOSIS — Z1211 Encounter for screening for malignant neoplasm of colon: Secondary | ICD-10-CM | POA: Diagnosis not present

## 2016-06-29 DIAGNOSIS — Z01419 Encounter for gynecological examination (general) (routine) without abnormal findings: Secondary | ICD-10-CM | POA: Diagnosis not present

## 2016-06-29 DIAGNOSIS — Z124 Encounter for screening for malignant neoplasm of cervix: Secondary | ICD-10-CM

## 2016-06-29 MED ORDER — ISOSORBIDE DINITRATE 10 MG PO TABS: 10.0000 mg | ORAL_TABLET | Freq: Three times a day (TID) | ORAL | 0 refills | Status: DC

## 2016-06-29 MED ORDER — HYDRALAZINE HCL 10 MG PO TABS: 10.0000 mg | ORAL_TABLET | Freq: Three times a day (TID) | ORAL | 0 refills | Status: DC

## 2016-06-29 MED ORDER — DIGOXIN 125 MCG PO TABS: 0.1250 mg | ORAL_TABLET | Freq: Every day | ORAL | 0 refills | Status: DC

## 2016-06-29 MED ORDER — SPIRONOLACTONE 25 MG PO TABS: 25.0000 mg | ORAL_TABLET | Freq: Every day | ORAL | 0 refills | Status: DC

## 2016-06-29 MED ORDER — ASPIRIN 81 MG PO TBEC: 81.0000 mg | DELAYED_RELEASE_TABLET | Freq: Every day | ORAL | 0 refills | Status: DC

## 2016-06-29 MED ORDER — SACUBITRIL-VALSARTAN 97-103 MG PO TABS: 1.0000 | ORAL_TABLET | Freq: Two times a day (BID) | ORAL | 0 refills | Status: DC

## 2016-06-29 MED ORDER — FERROUS SULFATE 325 (65 FE) MG PO TABS: 325.0000 mg | ORAL_TABLET | Freq: Two times a day (BID) | ORAL | 0 refills | Status: DC

## 2016-06-29 MED ORDER — ATORVASTATIN CALCIUM 40 MG PO TABS: 40.0000 mg | ORAL_TABLET | Freq: Every day | ORAL | 0 refills | Status: DC

## 2016-06-29 MED ORDER — FUROSEMIDE 40 MG PO TABS: 40.0000 mg | ORAL_TABLET | Freq: Every day | ORAL | 0 refills | Status: DC

## 2016-06-29 NOTE — Progress Notes (Signed)
Subjective:    Patient ID: Christine Wilson, female    DOB: 1961/02/21, 55 y.o.   MRN: [id number]  Patient presents today for complete physical or establish care (new patient) and medication consult  HPI  has not been taking Bidil due to high cost. States she called cardiologist for sample but had no response.  CHF: Daily weights: avg weight of 149.0 with minimal fluctuation. Has appt with cardiologist 07/11/16. Needs refill till next OV with cardiologist.  Immunizations: (TDAP, Hep C screen, Pneumovax, Influenza, zoster)  Health Maintenance  Topic Date Due  .  Hepatitis C: One time screening is recommended by Center for Disease Control  (CDC) for  adults born from [birth year] through 1967.   12-22-1960  . Tetanus Vaccine  06/01/1978  . Pap Smear  06/01/1980  . Mammogram  06/01/2009  . Colon Cancer Screening  06/01/2009  . Flu Shot  11/28/2016*  . HIV Screening  06/24/2017*  *Topic was postponed. The date shown is not the original due date.   Diet:regular Weight:  Wt Readings from Last 3 Encounters:  06/29/16 149 lb (67.6 kg)  06/20/16 149 lb (67.6 kg)  06/06/16 148 lb 11.2 oz (67.4 kg)   Exercise:none Fall Risk: Fall Risk  06/29/2016  Falls in the past year? No   Home Safety: home with daughter Depression/Suicide: Depression screen [facility] 2/9 06/29/2016 05/31/2016  Decreased Interest 0 0  Down, Depressed, Hopeless 0 0  PHQ - 2 Score 0 0   No flowsheet data found. Colonoscopy (every 5-62yr, >50-730yr:needed Pap Smear (every 3y48yror >21-29 without HPV, every 66yr76yrr >30-666yr32yrh HPV):needed Mammogram (yearly, >466yrs19yrded Vision:needed Dental:needed Advanced Directive: Advanced Directives 06/01/2016  Does Patient Have a Medical Advance Directive? No  Would patient like information on creating a medical advance directive? Yes - EducatScientist, clinical (histocompatibility and immunogenetics)   Sexual History (birth control, marital status, STD):single, not sexually active  Medications and allergies  reviewed with patient and updated if appropriate.  Patient Active Problem List   Diagnosis Date Noted  . Systolic and diastolic CHF, acute (HCC) 1[place]3/2017  . Impaired glucose tolerance 06/03/2016  . Acute systolic heart failure (HCC) 1[place]1/2017  . Acute exacerbation of CHF (congestive heart failure) (HCC) 1[place]1/2017  . Acute congestive heart failure (HCC) 1[place]0/2017  . Microcytic anemia 06/01/2016  . Hypertensive urgency 06/01/2016  . Dyspnea on exertion 06/01/2016    Current Outpatient Prescriptions on File Prior to Visit  Medication Sig Dispense Refill  . BIOTIN FORTE PO Take 1 tablet by mouth daily as needed (takes occassionaly).     . cholecalciferol (VITAMIN D) 1000 units tablet Take 1,000 Units by mouth daily.    . Garlic 10 MG CAPS Take 10 mg by mouth daily.     . Multiple Vitamins-Minerals (MULTIVITAMIN WITH MINERALS) tablet Take 1 tablet by mouth daily.    . Potassium (POTASSIMIN PO) Take 1 tablet by mouth daily.    . vitamin E 100 UNIT capsule Take by mouth daily.     No current facility-administered medications on file prior to visit.     Past Medical History:  Diagnosis Date  . CHF (congestive heart failure) (HCC) 1[place]9/2017   cardiologist at [facility]  . Hypertension     Past Surgical History:  Procedure Laterality Date  . CARDIAC CATHETERIZATION N/A 06/04/2016   Procedure: Right/Left Heart Cath and Coronary Angiography;  Surgeon: DanielJolaine Artist [facility];  Service: Cardiovascular;  Laterality: N/A;  . CESAREAN SECTION  Social History   Social History  . Marital status: Divorced    Spouse name: N/A  . Number of children: N/A  . Years of education: N/A   Social History Main Topics  . Smoking status: Never Smoker  . Smokeless tobacco: Never Used  . Alcohol use No  . Drug use: No  . Sexual activity: Not Asked   Other Topics Concern  . None   Social History Narrative  . None    Family History  Problem Relation Age of  Onset  . Diabetes Mother   . Hypertension Mother   . Heart disease Mother 20    a. Passed away due to MI  . Hypertension Father   . Diabetes Father   . Diabetes Brother   . Cancer Maternal Grandmother   . Hypertension Maternal Grandmother         Review of Systems  Constitutional: Negative for fever, malaise/fatigue and weight loss.  HENT: Negative for congestion and sore throat.   Eyes:       Negative for visual changes  Respiratory: Negative for cough and shortness of breath.   Cardiovascular: Negative for chest pain, palpitations and leg swelling.  Gastrointestinal: Negative for blood in stool, constipation, diarrhea and heartburn.  Genitourinary: Negative for dysuria, frequency and urgency.  Musculoskeletal: Negative for falls, joint pain and myalgias.  Skin: Negative for rash.  Neurological: Negative for dizziness, sensory change and headaches.  Endo/Heme/Allergies: Does not bruise/bleed easily.  Psychiatric/Behavioral: Negative for depression, substance abuse and suicidal ideas. The patient is not nervous/anxious.     Objective:   Vitals:   06/29/16 1111  BP: (!) 158/88  Pulse: 77  Temp: 97.5 F (36.4 C)    Body mass index is 28.15 kg/m.   Physical Examination:  Physical Exam  Constitutional: She is oriented to person, place, and time and well-developed, well-nourished, and in no distress. No distress.  HENT:  Right Ear: External ear normal.  Left Ear: External ear normal.  Nose: Nose normal.  Mouth/Throat: No oropharyngeal exudate.  Eyes: Conjunctivae and EOM are normal. Pupils are equal, round, and reactive to light. No scleral icterus.  Neck: Normal range of motion. Neck supple. No thyromegaly present.  Cardiovascular: Normal rate, regular rhythm, normal heart sounds and intact distal pulses.   Pulmonary/Chest: Effort normal and breath sounds normal.  Abdominal: Soft. Bowel sounds are normal. She exhibits no distension. There is no tenderness.    Genitourinary: Uterus normal, cervix normal, right adnexa normal, left adnexa normal and vulva normal. Rectal exam shows external hemorrhoid. Rectal exam shows no tenderness. Cervix exhibits no motion tenderness. Thin  odorless  white and vaginal discharge found.  Musculoskeletal: Normal range of motion. She exhibits no edema or tenderness.  Lymphadenopathy:    She has no cervical adenopathy.  Neurological: She is alert and oriented to person, place, and time. Gait normal.  Skin: Skin is warm and dry.  Psychiatric: Affect and judgment normal.  Vitals reviewed.   ASSESSMENT and PLAN:  Chemika was seen today for establish care.  Diagnoses and all orders for this visit:  Encounter for screening for cervical cancer  -     Ambulatory referral to Gastroenterology -     MM Digital Screening; Future -     Hepatitis C Antibody; Future -     Cytology - PAP; Future  Acute combined systolic and diastolic congestive heart failure (HCC) -     isosorbide dinitrate (ISORDIL) 10 MG tablet; Take 1 tablet (10 mg total)  by mouth 3 (three) times daily. -     hydrALAZINE (APRESOLINE) 10 MG tablet; Take 1 tablet (10 mg total) by mouth 3 (three) times daily. -     aspirin 81 MG EC tablet; Take 1 tablet (81 mg total) by mouth daily. -     atorvastatin (LIPITOR) 40 MG tablet; Take 1 tablet (40 mg total) by mouth daily at 6 PM. -     digoxin (LANOXIN) 0.125 MG tablet; Take 1 tablet (0.125 mg total) by mouth daily. -     ferrous sulfate 325 (65 FE) MG tablet; Take 1 tablet (325 mg total) by mouth 2 (two) times daily with a meal. -     furosemide (LASIX) 40 MG tablet; Take 1 tablet (40 mg total) by mouth daily. -     sacubitril-valsartan (ENTRESTO) 97-103 MG; Take 1 tablet by mouth 2 (two) times daily. -     spironolactone (ALDACTONE) 25 MG tablet; Take 1 tablet (25 mg total) by mouth daily. -     Ambulatory referral to Gastroenterology -     MM Digital Screening; Future -     Hepatitis C Antibody; Future -      Cytology - PAP; Future  Hypertensive urgency -     isosorbide dinitrate (ISORDIL) 10 MG tablet; Take 1 tablet (10 mg total) by mouth 3 (three) times daily. -     hydrALAZINE (APRESOLINE) 10 MG tablet; Take 1 tablet (10 mg total) by mouth 3 (three) times daily. -     aspirin 81 MG EC tablet; Take 1 tablet (81 mg total) by mouth daily. -     atorvastatin (LIPITOR) 40 MG tablet; Take 1 tablet (40 mg total) by mouth daily at 6 PM. -     digoxin (LANOXIN) 0.125 MG tablet; Take 1 tablet (0.125 mg total) by mouth daily. -     ferrous sulfate 325 (65 FE) MG tablet; Take 1 tablet (325 mg total) by mouth 2 (two) times daily with a meal. -     furosemide (LASIX) 40 MG tablet; Take 1 tablet (40 mg total) by mouth daily. -     sacubitril-valsartan (ENTRESTO) 97-103 MG; Take 1 tablet by mouth 2 (two) times daily. -     spironolactone (ALDACTONE) 25 MG tablet; Take 1 tablet (25 mg total) by mouth daily. -     Ambulatory referral to Gastroenterology -     MM Digital Screening; Future -     Hepatitis C Antibody; Future -     Cytology - PAP; Future  Microcytic anemia -     ferrous sulfate 325 (65 FE) MG tablet; Take 1 tablet (325 mg total) by mouth 2 (two) times daily with a meal. -     Ambulatory referral to Gastroenterology  Encounter for screening mammogram for breast cancer -     MM Digital Screening; Future  Screening for colon cancer -     Ambulatory referral to Gastroenterology  Encounter for preventative adult health care examination    No problem-specific Assessment & Plan notes found for this encounter.    Recent Results (from the past 2160 hour(s))  COMPLETE METABOLIC PANEL WITH GFR     Status: Abnormal   Collection Time: 05/31/16  5:26 PM  Result Value Ref Range   Sodium 141 135 - 146 mmol/L   Potassium 4.2 3.5 - 5.3 mmol/L   Chloride 106 98 - 110 mmol/L   CO2 24 20 - 31 mmol/L   Glucose, Bld 123 (H) 65 -  99 mg/dL   BUN 13 7 - 25 mg/dL   Creat 0.79 0.50 - 1.05 mg/dL     Comment:   For patients > or = 57 years of age: The upper reference limit for Creatinine is approximately 13% higher for people identified as African-American.      Total Bilirubin 0.8 0.2 - 1.2 mg/dL   Alkaline Phosphatase 77 33 - 130 U/L   AST 40 (H) 10 - 35 U/L   ALT 52 (H) 6 - 29 U/L   Total Protein 7.1 6.1 - 8.1 g/dL   Albumin 4.2 3.6 - 5.1 g/dL   Calcium 9.4 8.6 - 10.4 mg/dL   GFR, Est African American >89 >=60 mL/min   GFR, Est Non African American 84 >=60 mL/min  TSH     Status: None   Collection Time: 05/31/16  5:26 PM  Result Value Ref Range   TSH 2.67 mIU/L    Comment:   Reference Range   > or = 20 Years  0.40-4.50   Pregnancy Range First trimester  0.26-2.66 Second trimester 0.55-2.73 Third trimester  0.43-2.91     Lipid panel     Status: Abnormal   Collection Time: 05/31/16  5:26 PM  Result Value Ref Range   Cholesterol 173 <200 mg/dL    Comment: ** Please note change in reference range(s). **      Triglycerides 181 (H) <150 mg/dL    Comment: ** Please note change in reference range(s). **      HDL 54 >50 mg/dL    Comment: ** Please note change in reference range(s). **      Total CHOL/HDL Ratio 3.2 <5.0 Ratio   VLDL 36 (H) <30 mg/dL   LDL Cholesterol 83 mg/dL    Comment: ** Please note change in reference range(s). **     POCT CBC     Status: Abnormal   Collection Time: 05/31/16  5:35 PM  Result Value Ref Range   WBC 8.5 4.6 - 10.2 K/uL   Lymph, poc 2.4 0.6 - 3.4   POC LYMPH PERCENT 27.7 10 - 50 %L   MID (cbc) 0.6 0 - 0.9   POC MID % 6.8 0 - 12 %M   POC Granulocyte 5.6 2 - 6.9   Granulocyte percent 65.5 37 - 80 %G   RBC 5.56 (A) 4.04 - 5.48 M/uL   Hemoglobin 12.2 12.2 - 16.2 g/dL   HCT, POC 37.3 (A) 37.7 - 47.9 %   MCV 67.0 (A) 80 - 97 fL   MCH, POC 22.0 (A) 27 - 31.2 pg   MCHC 32.8 31.8 - 35.4 g/dL   RDW, POC 16.4 %   Platelet Count, POC 307 142 - 424 K/uL   MPV 8.7 0 - 99.8 fL  Basic metabolic panel     Status: None   Collection  Time: 05/31/16  8:03 PM  Result Value Ref Range   Sodium 139 135 - 145 mmol/L   Potassium 3.8 3.5 - 5.1 mmol/L   Chloride 106 101 - 111 mmol/L   CO2 23 22 - 32 mmol/L   Glucose, Bld 95 65 - 99 mg/dL   BUN 11 6 - 20 mg/dL   Creatinine, Ser 0.82 0.44 - 1.00 mg/dL   Calcium 9.1 8.9 - 10.3 mg/dL   GFR calc non Af Amer >60 >60 mL/min   GFR calc Af Amer >60 >60 mL/min    Comment: (NOTE) The eGFR has been calculated using the CKD EPI equation.  This calculation has not been validated in all clinical situations. eGFR's persistently <60 mL/min signify possible Chronic Kidney Disease.    Anion gap 10 5 - 15  CBC     Status: Abnormal   Collection Time: 05/31/16  8:03 PM  Result Value Ref Range   WBC 8.2 4.0 - 10.5 K/uL   RBC 5.66 (H) 3.87 - 5.11 MIL/uL   Hemoglobin 11.9 (L) 12.0 - 15.0 g/dL   HCT 38.1 36.0 - 46.0 %   MCV 67.3 (L) 78.0 - 100.0 fL   MCH 21.0 (L) 26.0 - 34.0 pg   MCHC 31.2 30.0 - 36.0 g/dL   RDW 16.1 (H) 11.5 - 15.5 %   Platelets 271 150 - 400 K/uL  D-dimer, quantitative (not at [facility])     Status: Abnormal   Collection Time: 05/31/16  8:03 PM  Result Value Ref Range   D-Dimer, Quant 1.48 (H) 0.00 - 0.50 ug/mL-FEU    Comment: (NOTE) At the manufacturer cut-off of 0.50 ug/mL FEU, this assay has been documented to exclude PE with a sensitivity and negative predictive value of 97 to 99%.  At this time, this assay has not been approved by the FDA to exclude DVT/VTE. Results should be correlated with clinical presentation.   Brain natriuretic peptide     Status: Abnormal   Collection Time: 05/31/16  8:03 PM  Result Value Ref Range   B Natriuretic Peptide 731.4 (H) 0.0 - 100.0 pg/mL  I-stat troponin, ED     Status: None   Collection Time: 05/31/16  8:05 PM  Result Value Ref Range   Troponin i, poc 0.02 0.00 - 0.08 ng/mL   Comment 3            Comment: Due to the release kinetics of cTnI, a negative result within the first hours of the onset of symptoms does not rule  out myocardial infarction with certainty. If myocardial infarction is still suspected, repeat the test at appropriate intervals.   Basic metabolic panel     Status: Abnormal   Collection Time: 06/01/16  2:29 AM  Result Value Ref Range   Sodium 141 135 - 145 mmol/L   Potassium 3.7 3.5 - 5.1 mmol/L   Chloride 102 101 - 111 mmol/L   CO2 27 22 - 32 mmol/L   Glucose, Bld 148 (H) 65 - 99 mg/dL   BUN 11 6 - 20 mg/dL   Creatinine, Ser 0.97 0.44 - 1.00 mg/dL   Calcium 9.6 8.9 - 10.3 mg/dL   GFR calc non Af Amer >60 >60 mL/min   GFR calc Af Amer >60 >60 mL/min    Comment: (NOTE) The eGFR has been calculated using the CKD EPI equation. This calculation has not been validated in all clinical situations. eGFR's persistently <60 mL/min signify possible Chronic Kidney Disease.    Anion gap 12 5 - 15  Magnesium     Status: None   Collection Time: 06/01/16  2:29 AM  Result Value Ref Range   Magnesium 1.9 1.7 - 2.4 mg/dL  Iron and TIBC     Status: Abnormal   Collection Time: 06/01/16  2:29 AM  Result Value Ref Range   Iron 76 28 - 170 ug/dL   TIBC 525 (H) 250 - 450 ug/dL   Saturation Ratios 14 10.4 - 31.8 %   UIBC 449 ug/dL  Ferritin     Status: None   Collection Time: 06/01/16  2:29 AM  Result Value Ref Range   Ferritin  85 11 - 307 ng/mL  Basic metabolic panel     Status: Abnormal   Collection Time: 06/02/16  2:35 AM  Result Value Ref Range   Sodium 143 135 - 145 mmol/L   Potassium 3.7 3.5 - 5.1 mmol/L   Chloride 105 101 - 111 mmol/L   CO2 25 22 - 32 mmol/L   Glucose, Bld 106 (H) 65 - 99 mg/dL   BUN 18 6 - 20 mg/dL   Creatinine, Ser 1.03 (H) 0.44 - 1.00 mg/dL   Calcium 9.5 8.9 - 10.3 mg/dL   GFR calc non Af Amer 59 (L) >60 mL/min   GFR calc Af Amer >60 >60 mL/min    Comment: (NOTE) The eGFR has been calculated using the CKD EPI equation. This calculation has not been validated in all clinical situations. eGFR's persistently <60 mL/min signify possible Chronic Kidney Disease.     Anion gap 13 5 - 15  Magnesium     Status: None   Collection Time: 06/02/16  2:35 AM  Result Value Ref Range   Magnesium 2.2 1.7 - 2.4 mg/dL  Hemoglobin A1c     Status: Abnormal   Collection Time: 06/02/16  6:37 AM  Result Value Ref Range   Hgb A1c MFr Bld 6.3 (H) 4.8 - 5.6 %    Comment: (NOTE)         Pre-diabetes: 5.7 - 6.4         Diabetes: >6.4         Glycemic control for adults with diabetes: <7.0    Mean Plasma Glucose 134 mg/dL    Comment: (NOTE) Performed At: [facility] [place], [place] [id number] Lindon Romp MD [id number]   Echocardiogram     Status: None   Collection Time: 06/02/16  3:41 PM  Result Value Ref Range   Weight 2,353.6 oz   Height 61 in   BP 138/72 mmHg  Basic metabolic panel     Status: Abnormal   Collection Time: 06/03/16  4:51 AM  Result Value Ref Range   Sodium 141 135 - 145 mmol/L   Potassium 4.1 3.5 - 5.1 mmol/L   Chloride 103 101 - 111 mmol/L   CO2 27 22 - 32 mmol/L   Glucose, Bld 99 65 - 99 mg/dL   BUN 20 6 - 20 mg/dL   Creatinine, Ser 1.01 (H) 0.44 - 1.00 mg/dL   Calcium 9.6 8.9 - 10.3 mg/dL   GFR calc non Af Amer >60 >60 mL/min   GFR calc Af Amer >60 >60 mL/min    Comment: (NOTE) The eGFR has been calculated using the CKD EPI equation. This calculation has not been validated in all clinical situations. eGFR's persistently <60 mL/min signify possible Chronic Kidney Disease.    Anion gap 11 5 - 15  Magnesium     Status: None   Collection Time: 06/03/16  4:51 AM  Result Value Ref Range   Magnesium 2.3 1.7 - 2.4 mg/dL  Basic metabolic panel     Status: Abnormal   Collection Time: 06/04/16  1:31 AM  Result Value Ref Range   Sodium 138 135 - 145 mmol/L   Potassium 4.1 3.5 - 5.1 mmol/L   Chloride 104 101 - 111 mmol/L   CO2 24 22 - 32 mmol/L   Glucose, Bld 118 (H) 65 - 99 mg/dL   BUN 26 (H) 6 - 20 mg/dL   Creatinine, Ser 0.96 0.44 - 1.00 mg/dL   Calcium 9.3 8.9 -  10.3 mg/dL   GFR calc non Af Amer  >60 >60 mL/min   GFR calc Af Amer >60 >60 mL/min    Comment: (NOTE) The eGFR has been calculated using the CKD EPI equation. This calculation has not been validated in all clinical situations. eGFR's persistently <60 mL/min signify possible Chronic Kidney Disease.    Anion gap 10 5 - 15  Protime-INR     Status: None   Collection Time: 06/04/16  1:38 AM  Result Value Ref Range   Prothrombin Time 13.6 11.4 - 15.2 seconds   INR 1.04   I-STAT 3, arterial blood gas (G3+)     Status: Abnormal   Collection Time: 06/04/16  9:09 AM  Result Value Ref Range   pH, Arterial 7.437 7.350 - 7.450   pCO2 arterial 35.3 32.0 - 48.0 mmHg   pO2, Arterial 71.0 (L) 83.0 - 108.0 mmHg   Bicarbonate 23.8 20.0 - 28.0 mmol/L   TCO2 25 0 - 100 mmol/L   O2 Saturation 95.0 %   Patient temperature HIDE    Sample type ARTERIAL   I-STAT 3, venous blood gas (G3P V)     Status: Abnormal   Collection Time: 06/04/16  9:14 AM  Result Value Ref Range   pH, Ven 7.372 7.250 - 7.430   pCO2, Ven 49.1 44.0 - 60.0 mmHg   pO2, Ven 25.0 (LL) 32.0 - 45.0 mmHg   Bicarbonate 28.5 (H) 20.0 - 28.0 mmol/L   TCO2 30 0 - 100 mmol/L   O2 Saturation 42.0 %   Acid-Base Excess 2.0 0.0 - 2.0 mmol/L   Patient temperature HIDE    Sample type VENOUS    Comment NOTIFIED PHYSICIAN   I-STAT 3, venous blood gas (G3P V)     Status: Abnormal   Collection Time: 06/04/16  9:14 AM  Result Value Ref Range   pH, Ven 7.383 7.250 - 7.430   pCO2, Ven 48.0 44.0 - 60.0 mmHg   pO2, Ven 25.0 (LL) 32.0 - 45.0 mmHg   Bicarbonate 28.6 (H) 20.0 - 28.0 mmol/L   TCO2 30 0 - 100 mmol/L   O2 Saturation 44.0 %   Acid-Base Excess 3.0 (H) 0.0 - 2.0 mmol/L   Patient temperature HIDE    Sample type VENOUS    Comment NOTIFIED PHYSICIAN   CBC     Status: Abnormal   Collection Time: 06/04/16 11:39 AM  Result Value Ref Range   WBC 7.7 4.0 - 10.5 K/uL   RBC 6.33 (H) 3.87 - 5.11 MIL/uL   Hemoglobin 13.5 12.0 - 15.0 g/dL   HCT 43.0 36.0 - 46.0 %   MCV 67.9  (L) 78.0 - 100.0 fL   MCH 21.3 (L) 26.0 - 34.0 pg   MCHC 31.4 30.0 - 36.0 g/dL   RDW 16.5 (H) 11.5 - 15.5 %   Platelets 289 150 - 400 K/uL  Creatinine, serum     Status: None   Collection Time: 06/04/16 11:39 AM  Result Value Ref Range   Creatinine, Ser 0.92 0.44 - 1.00 mg/dL   GFR calc non Af Amer >60 >60 mL/min   GFR calc Af Amer >60 >60 mL/min    Comment: (NOTE) The eGFR has been calculated using the CKD EPI equation. This calculation has not been validated in all clinical situations. eGFR's persistently <60 mL/min signify possible Chronic Kidney Disease.   Basic metabolic panel     Status: Abnormal   Collection Time: 06/05/16  3:00 AM  Result Value Ref Range  Sodium 141 135 - 145 mmol/L   Potassium 4.2 3.5 - 5.1 mmol/L   Chloride 108 101 - 111 mmol/L   CO2 24 22 - 32 mmol/L   Glucose, Bld 111 (H) 65 - 99 mg/dL   BUN 17 6 - 20 mg/dL   Creatinine, Ser 0.81 0.44 - 1.00 mg/dL   Calcium 8.9 8.9 - 10.3 mg/dL   GFR calc non Af Amer >60 >60 mL/min   GFR calc Af Amer >60 >60 mL/min    Comment: (NOTE) The eGFR has been calculated using the CKD EPI equation. This calculation has not been validated in all clinical situations. eGFR's persistently <60 mL/min signify possible Chronic Kidney Disease.    Anion gap 9 5 - 15  Basic metabolic panel     Status: Abnormal   Collection Time: 06/06/16  4:10 AM  Result Value Ref Range   Sodium 138 135 - 145 mmol/L   Potassium 4.2 3.5 - 5.1 mmol/L   Chloride 106 101 - 111 mmol/L   CO2 24 22 - 32 mmol/L   Glucose, Bld 111 (H) 65 - 99 mg/dL   BUN 18 6 - 20 mg/dL   Creatinine, Ser 0.79 0.44 - 1.00 mg/dL   Calcium 9.0 8.9 - 10.3 mg/dL   GFR calc non Af Amer >60 >60 mL/min   GFR calc Af Amer >60 >60 mL/min    Comment: (NOTE) The eGFR has been calculated using the CKD EPI equation. This calculation has not been validated in all clinical situations. eGFR's persistently <60 mL/min signify possible Chronic Kidney Disease.    Anion gap 8 5 -  15  Basic Metabolic Panel (BMET)     Status: Abnormal   Collection Time: 06/20/16 12:22 PM  Result Value Ref Range   Sodium 139 135 - 145 mmol/L   Potassium 4.5 3.5 - 5.1 mmol/L   Chloride 103 101 - 111 mmol/L   CO2 27 22 - 32 mmol/L   Glucose, Bld 100 (H) 65 - 99 mg/dL   BUN 15 6 - 20 mg/dL   Creatinine, Ser 0.87 0.44 - 1.00 mg/dL   Calcium 9.8 8.9 - 10.3 mg/dL   GFR calc non Af Amer >60 >60 mL/min   GFR calc Af Amer >60 >60 mL/min    Comment: (NOTE) The eGFR has been calculated using the CKD EPI equation. This calculation has not been validated in all clinical situations. eGFR's persistently <60 mL/min signify possible Chronic Kidney Disease.    Anion gap 9 5 - 15   Follow up: Return in about 6 months (around 12/28/2016) for DM, HTN and CHF.  Wilfred Lacy, NP

## 2016-06-29 NOTE — Patient Instructions (Signed)
Patient upcoming appt with cardiologist 07/11/16. Continue daily weight checks and record. DASH Eating Plan DASH stands for "Dietary Approaches to Stop Hypertension." The DASH eating plan is a healthy eating plan that has been shown to reduce high blood pressure (hypertension). Additional health benefits may include reducing the risk of type 2 diabetes mellitus, heart disease, and stroke. The DASH eating plan may also help with weight loss. What do I need to know about the DASH eating plan? For the DASH eating plan, you will follow these general guidelines:  Choose foods with less than 150 milligrams of sodium per serving (as listed on the food label).  Use salt-free seasonings or herbs instead of table salt or sea salt.  Check with your health care provider or pharmacist before using salt substitutes.  Eat lower-sodium products. These are often labeled as "low-sodium" or "no salt added."  Eat fresh foods. Avoid eating a lot of canned foods.  Eat more vegetables, fruits, and low-fat dairy products.  Choose whole grains. Look for the word "whole" as the first word in the ingredient list.  Choose fish and skinless chicken or [place] more often than red meat. Limit fish, poultry, and meat to 6 oz (170 g) each day.  Limit sweets, desserts, sugars, and sugary drinks.  Choose heart-healthy fats.  Eat more home-cooked food and less restaurant, buffet, and fast food.  Limit fried foods.  Do not fry foods. Cook foods using methods such as baking, boiling, grilling, and broiling instead.  When eating at a restaurant, ask that your food be prepared with less salt, or no salt if possible. What foods can I eat? Seek help from a dietitian for individual calorie needs. Grains  Whole grain or whole wheat bread. Brown rice. Whole grain or whole wheat pasta. Quinoa, bulgur, and whole grain cereals. Low-sodium cereals. Corn or whole wheat flour tortillas. Whole grain cornbread. Whole grain crackers.  Low-sodium crackers. Vegetables  Fresh or frozen vegetables (raw, steamed, roasted, or grilled). Low-sodium or reduced-sodium tomato and vegetable juices. Low-sodium or reduced-sodium tomato sauce and paste. Low-sodium or reduced-sodium canned vegetables. Fruits  All fresh, canned (in natural juice), or frozen fruits. Meat and Other Protein Products  Ground beef (85% or leaner), grass-fed beef, or beef trimmed of fat. Skinless chicken or [place]. Ground chicken or [place]. Pork trimmed of fat. All fish and seafood. Eggs. Dried beans, peas, or lentils. Unsalted nuts and seeds. Unsalted canned beans. Dairy  Low-fat dairy products, such as skim or 1% milk, 2% or reduced-fat cheeses, low-fat ricotta or cottage cheese, or plain low-fat yogurt. Low-sodium or reduced-sodium cheeses. Fats and Oils  Tub margarines without trans fats. Light or reduced-fat mayonnaise and salad dressings (reduced sodium). Avocado. Safflower, olive, or canola oils. Natural peanut or almond butter. Other  Unsalted popcorn and pretzels. The items listed above may not be a complete list of recommended foods or beverages. Contact your dietitian for more options.  What foods are not recommended? Grains  White bread. White pasta. White rice. Refined cornbread. Bagels and croissants. Crackers that contain trans fat. Vegetables  Creamed or fried vegetables. Vegetables in a cheese sauce. Regular canned vegetables. Regular canned tomato sauce and paste. Regular tomato and vegetable juices. Fruits  Canned fruit in light or heavy syrup. Fruit juice. Meat and Other Protein Products  Fatty cuts of meat. Ribs, chicken wings, bacon, sausage, bologna, salami, chitterlings, fatback, hot dogs, bratwurst, and packaged luncheon meats. Salted nuts and seeds. Canned beans with salt. Dairy  Whole or 2%  milk, cream, half-and-half, and cream cheese. Whole-fat or sweetened yogurt. Full-fat cheeses or blue cheese. Nondairy creamers and whipped  toppings. Processed cheese, cheese spreads, or cheese curds. Condiments  Onion and garlic salt, seasoned salt, table salt, and sea salt. Canned and packaged gravies. Worcestershire sauce. Tartar sauce. Barbecue sauce. Teriyaki sauce. Soy sauce, including reduced sodium. Steak sauce. Fish sauce. Oyster sauce. Cocktail sauce. Horseradish. Ketchup and mustard. Meat flavorings and tenderizers. Bouillon cubes. Hot sauce. Tabasco sauce. Marinades. Taco seasonings. Relishes. Fats and Oils  Butter, stick margarine, lard, shortening, ghee, and bacon fat. Coconut, palm kernel, or palm oils. Regular salad dressings. Other  Pickles and olives. Salted popcorn and pretzels. The items listed above may not be a complete list of foods and beverages to avoid. Contact your dietitian for more information.  Where can I find more information? National Heart, Lung, and Blood Institute: [employer] This information is not intended to replace advice given to you by your health care provider. Make sure you discuss any questions you have with your health care provider. Document Released: 06/28/2011 Document Revised: 12/15/2015 Document Reviewed: 05/13/2013 Elsevier Interactive Patient Education  2017 [employer].  low salt diet.

## 2016-06-29 NOTE — Progress Notes (Signed)
Pre visit review using our clinic review tool, if applicable. No additional management support is needed unless otherwise documented below in the visit note. 

## 2016-06-30 LAB — HEPATITIS C ANTIBODY: HCV Ab: NEGATIVE

## 2016-07-03 NOTE — Progress Notes (Signed)
Normal results

## 2016-07-11 ENCOUNTER — Ambulatory Visit (HOSPITAL_COMMUNITY)
Admission: RE | Admit: 2016-07-11 | Discharge: 2016-07-11 | Disposition: A | Discharge status: Home / Self Care | Payer: No Typology Code available for payment source | Source: Ambulatory Visit | Attending: Internal Medicine | Admitting: Internal Medicine

## 2016-07-11 VITALS — BP 172/98 | HR 85 | Wt 151.0 lb

## 2016-07-11 DIAGNOSIS — I5022 Chronic systolic (congestive) heart failure: Secondary | ICD-10-CM | POA: Diagnosis present

## 2016-07-11 DIAGNOSIS — I160 Hypertensive urgency: Secondary | ICD-10-CM

## 2016-07-11 DIAGNOSIS — I110 Hypertensive heart disease with heart failure: Secondary | ICD-10-CM | POA: Diagnosis not present

## 2016-07-11 DIAGNOSIS — I2510 Atherosclerotic heart disease of native coronary artery without angina pectoris: Secondary | ICD-10-CM | POA: Insufficient documentation

## 2016-07-11 DIAGNOSIS — I5041 Acute combined systolic (congestive) and diastolic (congestive) heart failure: Secondary | ICD-10-CM

## 2016-07-11 LAB — DIGOXIN LEVEL: Digoxin Level: 0.6 ng/mL — ABNORMAL LOW (ref 0.8–2.0)

## 2016-07-11 LAB — CYTOLOGY - PAP: Diagnosis: NEGATIVE

## 2016-07-11 MED ORDER — CARVEDILOL 3.125 MG PO TABS: 3.1250 mg | ORAL_TABLET | Freq: Two times a day (BID) | ORAL | 5 refills | Status: DC

## 2016-07-11 MED ORDER — ATORVASTATIN CALCIUM 40 MG PO TABS: 40.0000 mg | ORAL_TABLET | Freq: Every day | ORAL | 11 refills | Status: DC

## 2016-07-11 MED ORDER — ISOSORB DINITRATE-HYDRALAZINE 20-37.5 MG PO TABS: 1.0000 | ORAL_TABLET | Freq: Three times a day (TID) | ORAL | 5 refills | Status: DC

## 2016-07-11 NOTE — Patient Instructions (Signed)
Please INCREASE Bidil to 1 tablet THREE TIMES DAILY.   Please STOP hydralazine and isosorbide dinitrate.   Please START carvedilol 3.125 mg TWICE DAILY.   Labs today. We will call you with any abnormalities.   Please keep your appointment with Dr. Gala Romney on January 29th.

## 2016-07-11 NOTE — Progress Notes (Signed)
HPI:  Christine Wilson is a 55 year old AA female with history of hypertension and recently diagnosed systolic HF presents for post-hospital f/u.   Admitted in 11/17 with two-week of DOE. In ER, was found to have a blood pressure of 205/120. CT angiogram of the chest was performed which was negative for pulmonary embolism but show some interstitial edema with bilateral pleural effusions. Patient was admitted with acute CHF and uncontrolled hypertension patient was started on IV Lasix. Echo was done which show 15-20% of ejection fraction grade 1 diastolic dysfunction.   Right heart and left heart catheterization was done showed moderate nonobstructive CAD with 60% LAD lesion with borderline CO.  Cardiac MRI showed EF of 22% diffuse hypokinesis normal right ventricular function, but no evidence of prior MIs, myocarditis or infiltrative disease. She was diuresed and started on Entresto,spironolactone and digoxin.  She returns today for pharmacist-led HF medication titration. At last HF clinic visit on 06/20/16, Bidil 0.5 tab TID was initiated. Overall, feels well today. Able to do all ADLs and housework without any problem. No problem with going to store or walking up steps. Not watching fluid intake that closely. BP still high despite reported compliance with medications. No dizziness. I did notice that she was taking hydralazine, isosorbide dinitrate and Bidil concomitantly. She was not aware that they were the same drugs and her pharmacy sold them to her on the same day.     . Shortness of breath/dyspnea on exertion? no  . Orthopnea/PND? no . Edema? no . Lightheadedness/dizziness? no . Daily weights at home? Yes - ~148-150 lb . Blood pressure/heart rate monitoring at home? Yes - ~SBP 130-160s in the am . Following low-sodium/fluid-restricted diet? Yes - trying to do better with maintaining fluid intake to < 2L per day   HF Medications: Digoxin 0.125 mg PO daily Furosemide 40 mg PO daily Entresto 97/103 mg  PO BID Spironolactone 25 mg PO daily  Bidil 0.5 mg PO TID Hydralazine 10 mg PO TID Isosorbide dinitrate 10 mg PO TID  Has the patient been experiencing any side effects to the medications prescribed?  no  Does the patient have any problems obtaining medications due to transportation or finances?   No - using Entresto and Bidil copay cards  Understanding of regimen: fair Understanding of indications: fair Potential of compliance: good Patient understands to avoid NSAIDs. Patient understands to avoid decongestants.    Pertinent Lab Values: . 07/20/16:  Digoxin 0.6  . 06/20/16: Serum creatinine 0.87, BUN 15, Potassium 4.5, Sodium 139 . 06/03/16: Mag 2.3  Vital Signs: . Weight: 151 lb (dry weight: 148-150 lb) . Blood pressure: 172/98 mmHg  . Heart rate: 85 bpm    Assessment: 1. 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 NYHA class IIsymptoms.  - Volume status stable  - Continue dig 0.125 mg daily, furosemide 40 mg daily, Entresto 97/103 mg BID, and spironolactone 25 mg daily  - Increase Bidil to 1 tab TID and discontinue hydralazine and isosorbide dinitrate  - Start carvedilol 3.125 mg BID  - Basic disease state pathophysiology, medication indication, mechanism and side effects reviewed at length with patient and he verbalized understanding 2. HTN  - Uncontrolled with BP still significantly elevated above goal < 130/80 mmHg  - Continue Entresto and spironolactone, increase Bidil and start carvedilol as above  3. CAD, nonobstructive  - No CP  - Continue ASA and atorvastatin   Plan: 1) Medication changes: Based on clinical presentation, vital signs and recent labs will increase Bidil  to 1 tab TID, discontinue hydralazine and isosorbide dinitrate and start carvedilol 3.125 mg BID 2) Labs: Dig today 3) Follow-up: Dr. Gala Romney on 08/20/16   Tyler Deis. Bonnye Fava, PharmD, BCPS, CPP Clinical Pharmacist Pager: [phone] Phone: [phone] 07/11/2016  3:11 PM  Agree with above. Continue to titrate Bidil. Hopefully EF will improve with control of HTN.  Hazael Olveda,MD 11:29 PM

## 2016-07-11 NOTE — Progress Notes (Signed)
Normal results,

## 2016-07-31 ENCOUNTER — Other Ambulatory Visit: Payer: Self-pay | Admitting: Nurse Practitioner

## 2016-07-31 DIAGNOSIS — I160 Hypertensive urgency: Secondary | ICD-10-CM

## 2016-07-31 DIAGNOSIS — I5041 Acute combined systolic (congestive) and diastolic (congestive) heart failure: Secondary | ICD-10-CM

## 2016-07-31 NOTE — Telephone Encounter (Signed)
Routing to [place]----I'm not sure if you want to refill this med since another doctor has prescribed differently since patient's office visit with you----please advise, thanks

## 2016-08-06 ENCOUNTER — Telehealth (HOSPITAL_COMMUNITY): Payer: Self-pay | Admitting: Pharmacist

## 2016-08-06 ENCOUNTER — Telehealth: Payer: Self-pay | Admitting: Nurse Practitioner

## 2016-08-06 ENCOUNTER — Other Ambulatory Visit (HOSPITAL_COMMUNITY): Payer: Self-pay | Admitting: Pharmacist

## 2016-08-06 DIAGNOSIS — I160 Hypertensive urgency: Secondary | ICD-10-CM

## 2016-08-06 DIAGNOSIS — I5041 Acute combined systolic (congestive) and diastolic (congestive) heart failure: Secondary | ICD-10-CM

## 2016-08-06 MED ORDER — ATORVASTATIN CALCIUM 40 MG PO TABS: 40.0000 mg | ORAL_TABLET | Freq: Every day | ORAL | 11 refills | Status: DC

## 2016-08-06 MED ORDER — ASPIRIN 81 MG PO TBEC: 81.0000 mg | DELAYED_RELEASE_TABLET | Freq: Every day | ORAL | 11 refills | Status: AC

## 2016-08-06 MED ORDER — CARVEDILOL 3.125 MG PO TABS: 3.1250 mg | ORAL_TABLET | Freq: Two times a day (BID) | ORAL | 11 refills | Status: DC

## 2016-08-06 MED ORDER — SPIRONOLACTONE 25 MG PO TABS: 25.0000 mg | ORAL_TABLET | Freq: Every day | ORAL | 11 refills | Status: DC

## 2016-08-06 MED ORDER — SACUBITRIL-VALSARTAN 97-103 MG PO TABS: 1.0000 | ORAL_TABLET | Freq: Two times a day (BID) | ORAL | 11 refills | Status: DC

## 2016-08-06 MED ORDER — ISOSORB DINITRATE-HYDRALAZINE 20-37.5 MG PO TABS: 1.0000 | ORAL_TABLET | Freq: Three times a day (TID) | ORAL | 11 refills | Status: DC

## 2016-08-06 MED ORDER — FUROSEMIDE 40 MG PO TABS: 40.0000 mg | ORAL_TABLET | Freq: Every day | ORAL | 11 refills | Status: DC

## 2016-08-06 MED ORDER — DIGOXIN 125 MCG PO TABS: 0.1250 mg | ORAL_TABLET | Freq: Every day | ORAL | 11 refills | Status: DC

## 2016-08-06 NOTE — Telephone Encounter (Signed)
Call was left on vm at 5:06pm Friday 08/04/15. Return pt call and left message on [phone] number to return call.

## 2016-08-06 NOTE — Telephone Encounter (Signed)
Bidil 1 tab TID PA approved by [employer] commercial through 07/22/38.   Tyler Deis. Bonnye Fava, PharmD, BCPS, CPP Clinical Pharmacist Pager: [phone] Phone: [phone] 08/06/2016 3:37 PM

## 2016-08-07 ENCOUNTER — Encounter: Payer: Self-pay | Admitting: Gastroenterology

## 2016-08-07 ENCOUNTER — Other Ambulatory Visit (HOSPITAL_COMMUNITY): Payer: Self-pay | Admitting: Pharmacist

## 2016-08-07 ENCOUNTER — Other Ambulatory Visit: Payer: Self-pay | Admitting: Nurse Practitioner

## 2016-08-07 DIAGNOSIS — I5041 Acute combined systolic (congestive) and diastolic (congestive) heart failure: Secondary | ICD-10-CM

## 2016-08-07 DIAGNOSIS — I160 Hypertensive urgency: Secondary | ICD-10-CM

## 2016-08-07 DIAGNOSIS — D509 Iron deficiency anemia, unspecified: Secondary | ICD-10-CM

## 2016-08-07 MED ORDER — SPIRONOLACTONE 25 MG PO TABS: 25.0000 mg | ORAL_TABLET | Freq: Every day | ORAL | 11 refills | Status: DC

## 2016-08-07 MED ORDER — CARVEDILOL 3.125 MG PO TABS: 3.1250 mg | ORAL_TABLET | Freq: Two times a day (BID) | ORAL | 11 refills | Status: DC

## 2016-08-07 MED ORDER — FUROSEMIDE 40 MG PO TABS: 40.0000 mg | ORAL_TABLET | Freq: Every day | ORAL | 11 refills | Status: DC

## 2016-08-07 MED ORDER — SACUBITRIL-VALSARTAN 97-103 MG PO TABS: 1.0000 | ORAL_TABLET | Freq: Two times a day (BID) | ORAL | 11 refills | Status: DC

## 2016-08-07 MED ORDER — DIGOXIN 125 MCG PO TABS: 0.1250 mg | ORAL_TABLET | Freq: Every day | ORAL | 11 refills | Status: DC

## 2016-08-07 MED ORDER — ATORVASTATIN CALCIUM 40 MG PO TABS: 40.0000 mg | ORAL_TABLET | Freq: Every day | ORAL | 11 refills | Status: DC

## 2016-08-14 ENCOUNTER — Telehealth (HOSPITAL_COMMUNITY): Payer: Self-pay | Admitting: Pharmacist

## 2016-08-14 NOTE — Telephone Encounter (Signed)
Entresto 97-103 mg BID PA approved by [employer] [place] commercial through 07/22/38.   Tyler Deis. Bonnye Fava, PharmD, BCPS, CPP Clinical Pharmacist Pager: [phone] Phone: [phone] 08/14/2016 10:47 AM

## 2016-08-20 ENCOUNTER — Ambulatory Visit (HOSPITAL_COMMUNITY)
Admission: RE | Admit: 2016-08-20 | Discharge: 2016-08-20 | Disposition: A | Discharge status: Home / Self Care | Payer: BLUE CROSS/BLUE SHIELD | Source: Ambulatory Visit | Attending: Internal Medicine | Admitting: Internal Medicine

## 2016-08-20 ENCOUNTER — Encounter (HOSPITAL_COMMUNITY): Payer: Self-pay | Admitting: Internal Medicine

## 2016-08-20 VITALS — BP 152/82 | HR 61 | Wt 151.4 lb

## 2016-08-20 DIAGNOSIS — I110 Hypertensive heart disease with heart failure: Secondary | ICD-10-CM | POA: Diagnosis not present

## 2016-08-20 DIAGNOSIS — I5022 Chronic systolic (congestive) heart failure: Secondary | ICD-10-CM | POA: Diagnosis present

## 2016-08-20 DIAGNOSIS — Z833 Family history of diabetes mellitus: Secondary | ICD-10-CM | POA: Diagnosis not present

## 2016-08-20 DIAGNOSIS — I2510 Atherosclerotic heart disease of native coronary artery without angina pectoris: Secondary | ICD-10-CM | POA: Diagnosis not present

## 2016-08-20 DIAGNOSIS — I5042 Chronic combined systolic (congestive) and diastolic (congestive) heart failure: Secondary | ICD-10-CM

## 2016-08-20 DIAGNOSIS — I429 Cardiomyopathy, unspecified: Secondary | ICD-10-CM | POA: Insufficient documentation

## 2016-08-20 DIAGNOSIS — I160 Hypertensive urgency: Secondary | ICD-10-CM

## 2016-08-20 DIAGNOSIS — I5041 Acute combined systolic (congestive) and diastolic (congestive) heart failure: Secondary | ICD-10-CM

## 2016-08-20 DIAGNOSIS — Z8249 Family history of ischemic heart disease and other diseases of the circulatory system: Secondary | ICD-10-CM | POA: Insufficient documentation

## 2016-08-20 DIAGNOSIS — Z7982 Long term (current) use of aspirin: Secondary | ICD-10-CM | POA: Insufficient documentation

## 2016-08-20 DIAGNOSIS — Z809 Family history of malignant neoplasm, unspecified: Secondary | ICD-10-CM | POA: Diagnosis not present

## 2016-08-20 LAB — BASIC METABOLIC PANEL
Anion gap: 8 (ref 5–15)
BUN: 17 mg/dL (ref 6–20)
CO2: 28 mmol/L (ref 22–32)
Calcium: 9.6 mg/dL (ref 8.9–10.3)
Chloride: 103 mmol/L (ref 101–111)
Creatinine, Ser: 1.19 mg/dL — ABNORMAL HIGH (ref 0.44–1.00)
GFR calc Af Amer: 58 mL/min — ABNORMAL LOW (ref >60)
GFR calc non Af Amer: 50 mL/min — ABNORMAL LOW (ref >60)
Glucose, Bld: 95 mg/dL (ref 65–99)
Potassium: 3.8 mmol/L (ref 3.5–5.1)
Sodium: 139 mmol/L (ref 135–145)

## 2016-08-20 MED ORDER — ISOSORB DINITRATE-HYDRALAZINE 20-37.5 MG PO TABS: 1.0000 | ORAL_TABLET | Freq: Two times a day (BID) | ORAL | 11 refills | Status: DC

## 2016-08-20 MED ORDER — FUROSEMIDE 40 MG PO TABS: 40.0000 mg | ORAL_TABLET | ORAL | 11 refills | Status: DC | PRN

## 2016-08-20 MED ORDER — ISOSORB DINITRATE-HYDRALAZINE 20-37.5 MG PO TABS: 2.0000 | ORAL_TABLET | Freq: Three times a day (TID) | ORAL | 11 refills | Status: DC

## 2016-08-20 NOTE — Progress Notes (Signed)
ADVANCED HF CLINIC NOTE   Primary Cardiologist: Kamon Fahr  HPI:  Christine Wilson is a 56 year old female with history of hypertension and recently diagnosed systolic HF presents for post-hospital f/u.   Admitted in 11/17 with two-week of DOE. In ER, was found to have a blood pressure of 205/120. CT angiogram of the chest was performed which was negative for pulmonary embolism but show some interstitial edema with bilateral pleural effusions. Patient was admitted with acute CHF and uncontrolled hypertension patient was started on IV Lasix. Echo was done which show 15-20% of ejection fraction grade 1 diastolic dysfunction.   Right heart and left heart catheterization was done showed moderate nonobstructive CAD with 60% LAD lesion with borderline CO.  Cardiac MRI showed EF of 22% diffuse hypokinesis normal right ventricular function, but no evidence of prior MIs, myocarditis or infiltrative disease.  She was diuresed and started on Entresto, spironolactone and digoxin.  She returns for f/u. Has been following up in PharmD clinic for med titration. Now on full dose Entresto. Carvedilol 3.125 bid and Bidil 1 tab tid. Feels good. Able to do all ADLs and housework without any problem. No problem with going to store or walking up steps. No edema, orthopnea or PND. Compliant with meds. Weight stable. 149-151. Taking BP at home regularly and SBP 135-145. Occasional readings 115-120. Gets dizzy when bending over and standing up.   I did bedside echo in clinic today. EF 50-55%  Cath 06/04/16  Findings:  Ao = 141/85 (105) LV =  141/11 RA = 4 RV = 29/1 PA = 34/12 (21) PCW = 8 Fick cardiac output/index = 2.6/1.6 Thermo CO/CI = 2.9/1.8 PVR = 5.0 WU SVR = 3087 FA sat = 95% PA sat = 42%, 44%  Assessment: 1. 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 Markedly depressed CO with severely elevated SVR 4. Low filling pressures     Past  Medical History:  Diagnosis Date  . CHF (congestive heart failure) (HCC) 05/31/2016   cardiologist at [facility]  . Hypertension     Current Outpatient Prescriptions  Medication Sig Dispense Refill  . aspirin 81 MG EC tablet Take 1 tablet (81 mg total) by mouth daily. 30 tablet 11  . atorvastatin (LIPITOR) 40 MG tablet Take 1 tablet (40 mg total) by mouth daily at 6 PM. 30 tablet 11  . BIOTIN FORTE PO Take 1 tablet by mouth daily as needed (takes occassionaly).     . carvedilol (COREG) 3.125 MG tablet Take 1 tablet (3.125 mg total) by mouth 2 (two) times daily. 60 tablet 11  . cholecalciferol (VITAMIN D) 1000 units tablet Take 1,000 Units by mouth daily.    . digoxin (LANOXIN) 0.125 MG tablet Take 1 tablet (0.125 mg total) by mouth daily. 30 tablet 11  . furosemide (LASIX) 40 MG tablet Take 1 tablet (40 mg total) by mouth daily. 30 tablet 11  . Garlic 10 MG CAPS Take 10 mg by mouth daily.     . isosorbide-hydrALAZINE (BIDIL) 20-37.5 MG tablet Take 1 tablet by mouth 3 (three) times daily. 90 tablet 11  . Multiple Vitamins-Minerals (MULTIVITAMIN WITH MINERALS) tablet Take 1 tablet by mouth daily.    . sacubitril-valsartan (ENTRESTO) 97-103 MG Take 1 tablet by mouth 2 (two) times daily. 60 tablet 11  . spironolactone (ALDACTONE) 25 MG tablet Take 1 tablet (25 mg total) by mouth daily. 30 tablet 11  . vitamin E 100 UNIT capsule Take  by mouth daily.     No current facility-administered medications for this encounter.     No Known Allergies    Social History   Social History  . Marital status: Divorced    Spouse name: N/A  . Number of children: N/A  . Years of education: N/A   Occupational History  . Not on file.   Social History Main Topics  . Smoking status: Never Smoker  . Smokeless tobacco: Never Used  . Alcohol use No  . Drug use: No  . Sexual activity: Not on file   Other Topics Concern  . Not on file   Social History Narrative  . No narrative on file      Family  History  Problem Relation Age of Onset  . Diabetes Mother   . Hypertension Mother   . Heart disease Mother 80    a. Passed away due to MI  . Hypertension Father   . Diabetes Father   . Diabetes Brother   . Cancer Maternal Grandmother   . Hypertension Maternal Grandmother     Vitals:   08/20/16 1529  BP: (!) 152/82  Pulse: 61  SpO2: 100%  Weight: 151 lb 6.4 oz (68.7 kg)    PHYSICAL EXAM: General:  Well appearing. No respiratory difficulty HEENT: normal Neck: supple. no JVD. Carotids 2+ bilat; no bruits. No lymphadenopathy or thryomegaly appreciated. Cor: PMI nondisplaced. Regular rate & rhythm. No rubs, gallops or murmurs. Lungs: clear Abdomen: soft, nontender, nondistended. No hepatosplenomegaly. No bruits or masses. Good bowel sounds. Extremities: no cyanosis, clubbing, rash, edema Neuro: alert & oriented x 3, cranial nerves grossly intact. moves all 4 extremities w/o difficulty. Affect pleasant.   ASSESSMENT & PLAN: 1. Chronic systolic HF --onset 11/17. EF 15-20% (11/17). cMRI EF 22%. Cath 11/17 nonobstructive CAD.suspect HTN CM --NYHA II. I did bedside echo in clinic today and EF 50-55% --Volume status ok - maybe a little low. Will make lasix prn only --Continue Entresto 97/103 bid, spiro 25 daily and digoxin --Increase Bidil to 2 tab tid as tolerrated --Continue carvedilol 3.125 bid --Formal echo at next visit --Labs today 2. HTN, uncontrolled --BP improving. Still a bit high. Titrating Bidil 3. CAD, nonobstructive --No CP. Continue ASA 81 and atorva   Nashonda Limberg,MD 3:36 PM

## 2016-08-20 NOTE — Patient Instructions (Addendum)
Labs today (will call for abnormal results, otherwise no news is good news)  STOP Lasix, only use as needed for weight gain, swelling, or shortness of breath  Increase Bidil to 2 Tablets by mouth 3 times Daily  Follow up in 2 months with Echo

## 2016-09-04 ENCOUNTER — Telehealth: Payer: Self-pay | Admitting: *Deleted

## 2016-09-04 NOTE — Telephone Encounter (Signed)
2nd message left for pt..the patient needs to reschedule PV and colonoscopy for after 10/16/16 when echocardiogram is completed and results can be evaluated per CRNA [facility] Monday.

## 2016-09-04 NOTE — Telephone Encounter (Signed)
PV and colon rescheduled for April after repeat echo

## 2016-09-18 ENCOUNTER — Encounter: Payer: PRIVATE HEALTH INSURANCE | Admitting: Gastroenterology

## 2016-10-16 ENCOUNTER — Telehealth (HOSPITAL_COMMUNITY): Payer: Self-pay | Admitting: Vascular Surgery

## 2016-10-16 ENCOUNTER — Encounter (HOSPITAL_COMMUNITY): Payer: Self-pay | Admitting: Internal Medicine

## 2016-10-16 ENCOUNTER — Ambulatory Visit (HOSPITAL_COMMUNITY)
Admission: RE | Admit: 2016-10-16 | Discharge: 2016-10-16 | Disposition: A | Discharge status: Home / Self Care | Payer: BLUE CROSS/BLUE SHIELD | Source: Ambulatory Visit | Attending: Internal Medicine | Admitting: Internal Medicine

## 2016-10-16 ENCOUNTER — Encounter (HOSPITAL_COMMUNITY): Payer: BLUE CROSS/BLUE SHIELD | Admitting: Internal Medicine

## 2016-10-16 ENCOUNTER — Ambulatory Visit (HOSPITAL_BASED_OUTPATIENT_CLINIC_OR_DEPARTMENT_OTHER)
Admission: RE | Admit: 2016-10-16 | Discharge: 2016-10-16 | Disposition: A | Discharge status: Home / Self Care | Payer: BLUE CROSS/BLUE SHIELD | Source: Ambulatory Visit | Attending: Internal Medicine | Admitting: Internal Medicine

## 2016-10-16 VITALS — BP 140/82 | HR 63 | Wt 148.8 lb

## 2016-10-16 DIAGNOSIS — I10 Essential (primary) hypertension: Secondary | ICD-10-CM

## 2016-10-16 DIAGNOSIS — I501 Left ventricular failure: Secondary | ICD-10-CM | POA: Insufficient documentation

## 2016-10-16 DIAGNOSIS — I340 Nonrheumatic mitral (valve) insufficiency: Secondary | ICD-10-CM | POA: Insufficient documentation

## 2016-10-16 DIAGNOSIS — I517 Cardiomegaly: Secondary | ICD-10-CM | POA: Insufficient documentation

## 2016-10-16 DIAGNOSIS — I5042 Chronic combined systolic (congestive) and diastolic (congestive) heart failure: Secondary | ICD-10-CM | POA: Insufficient documentation

## 2016-10-16 DIAGNOSIS — I420 Dilated cardiomyopathy: Secondary | ICD-10-CM | POA: Insufficient documentation

## 2016-10-16 LAB — ECHOCARDIOGRAM COMPLETE
E decel time: 292 msec
E/e' ratio: 14.31
FS: 32 % (ref 28–44)
IVS/LV PW RATIO, ED: 0.82
LA ID, A-P, ES: 39 mm
LA diam end sys: 39 mm
LA diam index: 2.32 cm/m2
LA vol A4C: 62.9 ml
LV E/e' medial: 14.31
LV E/e'average: 14.31
LV PW d: 11 mm — AB (ref 0.6–1.1)
LV e' LATERAL: 5.22 cm/s
LVOT SV: 57 mL
LVOT VTI: 22.5 cm
LVOT area: 2.54 cm2
LVOT diameter: 18 mm
LVOT peak vel: 93.8 cm/s
Lateral S' vel: 10.4 cm/s
MV Dec: 292
MV Peak grad: 2 mmHg
MV pk A vel: 124 m/s
MV pk E vel: 74.7 m/s
Reg peak vel: 279 cm/s
TAPSE: 27.3 mm
TDI e' lateral: 5.22
TDI e' medial: 5
TR max vel: 279 cm/s

## 2016-10-16 LAB — BASIC METABOLIC PANEL
Anion gap: 9 (ref 5–15)
BUN: 10 mg/dL (ref 6–20)
CO2: 29 mmol/L (ref 22–32)
Calcium: 9.5 mg/dL (ref 8.9–10.3)
Chloride: 103 mmol/L (ref 101–111)
Creatinine, Ser: 0.97 mg/dL (ref 0.44–1.00)
GFR calc Af Amer: >60 mL/min (ref >60)
GFR calc non Af Amer: >60 mL/min (ref >60)
Glucose, Bld: 104 mg/dL — ABNORMAL HIGH (ref 65–99)
Potassium: 4.1 mmol/L (ref 3.5–5.1)
Sodium: 141 mmol/L (ref 135–145)

## 2016-10-16 MED ORDER — ISOSORB DINITRATE-HYDRALAZINE 20-37.5 MG PO TABS: 1.0000 | ORAL_TABLET | Freq: Three times a day (TID) | ORAL | 3 refills | Status: DC

## 2016-10-16 NOTE — Addendum Note (Signed)
Encounter addended by: Noralee Space, RN on: 10/16/2016  3:34 PM<BR>    Actions taken: Diagnosis association updated, Order list changed, Sign clinical note

## 2016-10-16 NOTE — Progress Notes (Signed)
ADVANCED HF CLINIC NOTE   Primary Cardiologist: Cleotis Sparr  HPI:  Christine Wilson is a 56 year old female with history of hypertension, chronic combined systolic and diastolic CHF (EF 16-10%) in Nov. 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   Admitted in Nov. 2017 with two-week of DOE. In ER, was found to have a blood pressure of 205/120. CT angiogram of the chest was performed which was negative for pulmonary embolism but show some interstitial edema with bilateral pleural effusions. Patient was admitted with acute CHF and uncontrolled hypertension patient was started on IV Lasix. Echo was done which show 15-20% of ejection fraction grade 1 diastolic dysfunction.   Right heart and left heart catheterization 11/17 was done showed moderate nonobstructive CAD with 60% LAD lesion with borderline CO.  Cardiac MRI showed EF of 22% diffuse hypokinesis normal right ventricular function, but no evidence of prior MIs, myocarditis or infiltrative disease. She was diuresed and started on Entresto, spironolactone and digoxin.  She returns today for HF follow up. She is feeling well overall. Weighs daily, ranges from 147-150 pounds. Has been taking all medications, has missed 2 doses in the past month but otherwise highly compliant. Blood pressures still high. BP readings at home are 140-160's/80-90's. Drinks less than 2 L a day, eating a low salt diet. Does not feel SOB at rest, can walk in stores without SOB, no SOB with stairs. Has not taken any Lasix in months. Overall feeling good. No orthopnea or PND.    Echo 06/02/2016 EF 15-20%.  Echo 10/16/16 EF EF 55-60% (reviewed personally)    R/L heart cath 06/04/16 Findings:  Ao = 141/85 (105) LV =  141/11 RA = 4 RV = 29/1 PA = 34/12 (21) PCW = 8 Fick cardiac output/index = 2.6/1.6 Thermo CO/CI = 2.9/1.8 PVR = 5.0 WU SVR = 3087 FA sat = 95% PA sat = 42%, 44%  Assessment: 1. 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 Severe NICM EF ~25% suspect due  to hypertensive CM 3. Markedly depressed CO with severely elevated SVR 4. Low filling pressures     Past Medical History:  Diagnosis Date  . CHF (congestive heart failure) (HCC) 05/31/2016   cardiologist at [facility]  . Hypertension     Current Outpatient Prescriptions  Medication Sig Dispense Refill  . aspirin 81 MG EC tablet Take 1 tablet (81 mg total) by mouth daily. 30 tablet 11  . atorvastatin (LIPITOR) 40 MG tablet Take 1 tablet (40 mg total) by mouth daily at 6 PM. 30 tablet 11  . BIOTIN FORTE PO Take 1 tablet by mouth daily as needed (takes occassionaly).     . carvedilol (COREG) 3.125 MG tablet Take 1 tablet (3.125 mg total) by mouth 2 (two) times daily. 60 tablet 11  . cholecalciferol (VITAMIN D) 1000 units tablet Take 1,000 Units by mouth daily.    . digoxin (LANOXIN) 0.125 MG tablet Take 1 tablet (0.125 mg total) by mouth daily. 30 tablet 11  . furosemide (LASIX) 40 MG tablet Take 1 tablet (40 mg total) by mouth as needed for fluid or edema. 30 tablet 11  . Garlic 10 MG CAPS Take 10 mg by mouth daily.     . isosorbide-hydrALAZINE (BIDIL) 20-37.5 MG tablet Take 2 tablets by mouth 3 (three) times daily. 90 tablet 11  . Multiple Vitamins-Minerals (MULTIVITAMIN WITH MINERALS) tablet Take 1 tablet by mouth daily.    . sacubitril-valsartan (ENTRESTO) 97-103 MG Take 1 tablet by mouth 2 (two) times daily. 60 tablet  11  . spironolactone (ALDACTONE) 25 MG tablet Take 1 tablet (25 mg total) by mouth daily. 30 tablet 11  . vitamin E 100 UNIT capsule Take by mouth daily.     No current facility-administered medications for this encounter.     No Known Allergies    Social History   Social History  . Marital status: Divorced    Spouse name: N/A  . Number of children: N/A  . Years of education: N/A   Occupational History  . Not on file.   Social History Main Topics  . Smoking status: Never Smoker  . Smokeless tobacco: Never Used  . Alcohol use No  . Drug use: No  . Sexual  activity: Not on file   Other Topics Concern  . Not on file   Social History Narrative  . No narrative on file      Family History  Problem Relation Age of Onset  . Diabetes Mother   . Hypertension Mother   . Heart disease Mother 30    a. Passed away due to MI  . Hypertension Father   . Diabetes Father   . Diabetes Brother   . Cancer Maternal Grandmother   . Hypertension Maternal Grandmother     Vitals:   10/16/16 1430  BP: 140/82  Pulse: 63  SpO2: 99%  Weight: 148 lb 12 oz (67.5 kg)    PHYSICAL EXAM: General:  Well appearing female, NAD. HEENT: Normal  anicteric Neck: supple. No Jvd, Carotids 2+ bilat; no bruits. No lymphadenopathy or thryomegaly appreciated. Cor: PMI nondisplaced. Regular rate and rhythm. 2/6 SEM RUSB Lungs: Clear to auscultation bilaterally.  No wheeze Abdomen: soft, nontender, nondistended. No hepatosplenomegaly. No bruits or masses. Bowel sounds present.  Extremities: no cyanosis, clubbing, rash. No edema  Neuro: alert & oriented x 3, cranial nerves grossly intact. moves all 4 extremities w/o difficulty. Affect pleasant    ASSESSMENT & PLAN: 1. Chronic systolic HF --onset 11/17. EF 15-20% (11/17). cMRI EF 22%. Cath 11/17 nonobstructive CAD.suspect HTN CM - NYHA II.  - Volume status stable.  - Continue Entresto 97/103 bid - Continue spiro 25 daily  - Continue digoxin - Increase Bidil to TID dosing - currently takes BID.  - Continue carvedilol 3.125 bid 2. HTN: Home BP's still not consistently at goal  - Increase Bidil as above.  3. CAD, nonobstructive - No CP. Continue ASA 81 and atorva   Christine Ishikawa, NP-C  1:40 PM   Patient seen and examined with Suzzette Righter, NP. We discussed all aspects of the encounter. I agree with the assessment and plan as stated above.    Doing very well. NYHA II. Echo today reviewed personally. EF completely back to normal. Volume status looks good. BP still a bit elevated. Will increase Bidil from 1 tab bid  to 1 tab tid. Continue to follow BP closely with goal SBP consistently < 140. Labs today. Check renal artery u/s.   Arvilla Meres, MD  3:08 PM

## 2016-10-16 NOTE — Patient Instructions (Signed)
Increase Bidil to Three times a day   Labs today  Your physician has requested that you have a renal artery duplex. During this test, an ultrasound is used to evaluate blood flow to the kidneys. Allow one hour for this exam. Do not eat after midnight the day before and avoid carbonated beverages. Take your medications as you usually do.  You have been referred to Fara Chute D in 1 month  We will contact you in 4 months to schedule your next appointment.

## 2016-10-16 NOTE — Telephone Encounter (Signed)
Staff message sent to Rolly Salter to contact pt to schedule renal ultrasound

## 2016-10-16 NOTE — Progress Notes (Signed)
  Echocardiogram 2D Echocardiogram has been performed.  Christine Wilson 10/16/2016, 3:30 PM

## 2016-10-23 ENCOUNTER — Ambulatory Visit (AMBULATORY_SURGERY_CENTER): Payer: Self-pay | Admitting: *Deleted

## 2016-10-23 VITALS — Ht 61.0 in | Wt 150.4 lb

## 2016-10-23 DIAGNOSIS — Z1211 Encounter for screening for malignant neoplasm of colon: Secondary | ICD-10-CM

## 2016-10-23 MED ORDER — NA SULFATE-K SULFATE-MG SULF 17.5-3.13-1.6 GM/177ML PO SOLN: ORAL | 0 refills | Status: DC

## 2016-10-23 NOTE — Progress Notes (Signed)
Pt denies allergies to eggs or soy products. Denies difficulty with sedation or anesthesia. Denies any diet or weight loss medications. Denies use of supplemental oxygen.  Emmi instructions given for procedure.  

## 2016-10-30 ENCOUNTER — Telehealth: Payer: Self-pay | Admitting: Gastroenterology

## 2016-10-30 ENCOUNTER — Ambulatory Visit (HOSPITAL_COMMUNITY)
Admission: RE | Admit: 2016-10-30 | Discharge: 2016-10-30 | Disposition: A | Discharge status: Home / Self Care | Payer: BLUE CROSS/BLUE SHIELD | Source: Ambulatory Visit | Attending: Cardiovascular Disease | Admitting: Cardiovascular Disease

## 2016-10-30 DIAGNOSIS — I10 Essential (primary) hypertension: Secondary | ICD-10-CM | POA: Insufficient documentation

## 2016-10-30 NOTE — Telephone Encounter (Signed)
Called pt to pick up sample kit

## 2016-11-02 ENCOUNTER — Other Ambulatory Visit: Payer: Self-pay | Admitting: Nurse Practitioner

## 2016-11-02 DIAGNOSIS — Z1231 Encounter for screening mammogram for malignant neoplasm of breast: Secondary | ICD-10-CM

## 2016-11-05 ENCOUNTER — Ambulatory Visit
Admission: RE | Admit: 2016-11-05 | Discharge: 2016-11-05 | Disposition: A | Discharge status: Home / Self Care | Payer: BLUE CROSS/BLUE SHIELD | Source: Ambulatory Visit | Attending: Nurse Practitioner | Admitting: Nurse Practitioner

## 2016-11-05 DIAGNOSIS — Z1231 Encounter for screening mammogram for malignant neoplasm of breast: Secondary | ICD-10-CM

## 2016-11-06 ENCOUNTER — Ambulatory Visit (AMBULATORY_SURGERY_CENTER): Payer: BLUE CROSS/BLUE SHIELD | Admitting: Gastroenterology

## 2016-11-06 ENCOUNTER — Encounter: Payer: Self-pay | Admitting: Gastroenterology

## 2016-11-06 VITALS — BP 102/66 | HR 83 | Temp 97.5°F | Resp 13 | Ht 61.0 in | Wt 149.0 lb

## 2016-11-06 DIAGNOSIS — Z1211 Encounter for screening for malignant neoplasm of colon: Secondary | ICD-10-CM

## 2016-11-06 DIAGNOSIS — Z1212 Encounter for screening for malignant neoplasm of rectum: Secondary | ICD-10-CM

## 2016-11-06 MED ORDER — SODIUM CHLORIDE 0.9 % IV SOLN: 500.0000 mL | INTRAVENOUS | Status: DC

## 2016-11-06 NOTE — Progress Notes (Signed)
A and O x3. Report to RN. Tolerated MAC anesthesia well.

## 2016-11-06 NOTE — Patient Instructions (Signed)
YOU HAD AN ENDOSCOPIC PROCEDURE TODAY AT THE [facility]:   Refer to the procedure report that was given to you for any specific questions about what was found during the examination.  If the procedure report does not answer your questions, please call your gastroenterologist to clarify.  If you requested that your care partner not be given the details of your procedure findings, then the procedure report has been included in a sealed envelope for you to review at your convenience later.  YOU SHOULD EXPECT: Some feelings of bloating in the abdomen. Passage of more gas than usual.  Walking can help get rid of the air that was put into your GI tract during the procedure and reduce the bloating. If you had a lower endoscopy (such as a colonoscopy or flexible sigmoidoscopy) you may notice spotting of blood in your stool or on the toilet paper. If you underwent a bowel prep for your procedure, you may not have a normal bowel movement for a few days.  Please Note:  You might notice some irritation and congestion in your nose or some drainage.  This is from the oxygen used during your procedure.  There is no need for concern and it should clear up in a day or so.  SYMPTOMS TO REPORT IMMEDIATELY:   Following lower endoscopy (colonoscopy or flexible sigmoidoscopy):  Excessive amounts of blood in the stool  Significant tenderness or worsening of abdominal pains  Swelling of the abdomen that is new, acute  Fever of 100F or higher   For urgent or emergent issues, a gastroenterologist can be reached at any hour by calling [phone].   DIET:  We do recommend a small meal at first, but then you may proceed to your regular diet.  Drink plenty of fluids but you should avoid alcoholic beverages for 24 hours.  ACTIVITY:  You should plan to take it easy for the rest of today and you should NOT DRIVE or use heavy machinery until tomorrow (because of the sedation medicines used during the test).     FOLLOW UP: Our staff will call the number listed on your records the next business day following your procedure to check on you and address any questions or concerns that you may have regarding the information given to you following your procedure. If we do not reach you, we will leave a message.  However, if you are feeling well and you are not experiencing any problems, there is no need to return our call.  We will assume that you have returned to your regular daily activities without incident.  If any biopsies were taken you will be contacted by phone or by letter within the next 1-3 weeks.  Please call us at [phone] if you have not heard about the biopsies in 3 weeks.    SIGNATURES/CONFIDENTIALITY: You and/or your care partner have signed paperwork which will be entered into your electronic medical record.  These signatures attest to the fact that that the information above on your After Visit Summary has been reviewed and is understood.  Full responsibility of the confidentiality of this discharge information lies with you and/or your care-partner.  hemorrhoid information given.  Recall 10 years-2028

## 2016-11-06 NOTE — Progress Notes (Signed)
Pt. Did not have a ride at clinic. Kept later because she Had to wait for ride to arrive.

## 2016-11-06 NOTE — Op Note (Signed)
[facility] Patient Name: Christine Wilson Procedure Date: 11/06/2016 3:14 PM MRN: [id number] Endoscopist: Napoleon Form , MD Age: 56 Referring MD:  Date of Birth: 24-Dec-1960 Gender: Female Account #: [id number] Procedure:                Colonoscopy Indications:              Screening for colorectal malignant neoplasm, This                            is the patient's first colonoscopy Medicines:                Monitored Anesthesia Care Procedure:                Pre-Anesthesia Assessment:                           - Prior to the procedure, a History and Physical                            was performed, and patient medications and                            allergies were reviewed. The patient's tolerance of                            previous anesthesia was also reviewed. The risks                            and benefits of the procedure and the sedation                            options and risks were discussed with the patient.                            All questions were answered, and informed consent                            was obtained. Prior Anticoagulants: The patient has                            taken no previous anticoagulant or antiplatelet                            agents. ASA Grade Assessment: II - A patient with                            mild systemic disease. After reviewing the risks                            and benefits, the patient was deemed in                            satisfactory condition to undergo the procedure.  After obtaining informed consent, the colonoscope                            was passed under direct vision. Throughout the                            procedure, the patient's blood pressure, pulse, and                            oxygen saturations were monitored continuously. The                            Colonoscope was introduced through the anus and                            advanced to the the  cecum, identified by                            appendiceal orifice and ileocecal valve. The                            colonoscopy was performed without difficulty. The                            patient tolerated the procedure well. The quality                            of the bowel preparation was excellent. The                            ileocecal valve, appendiceal orifice, and rectum                            were photographed. Scope In: 3:24:19 PM Scope Out: 3:39:46 PM Scope Withdrawal Time: 0 hours 11 minutes 7 seconds  Total Procedure Duration: 0 hours 15 minutes 27 seconds  Findings:                 The perianal and digital rectal examinations were                            normal.                           Non-bleeding internal hemorrhoids were found during                            retroflexion. The hemorrhoids were small.                           The exam was otherwise without abnormality. Complications:            No immediate complications. Estimated Blood Loss:     Estimated blood loss: none. Impression:               - Non-bleeding internal hemorrhoids.                           -  The examination was otherwise normal.                           - No specimens collected. Recommendation:           - Patient has a contact number available for                            emergencies. The signs and symptoms of potential                            delayed complications were discussed with the                            patient. Return to normal activities tomorrow.                            Written discharge instructions were provided to the                            patient.                           - Resume previous diet.                           - Continue present medications.                           - Repeat colonoscopy in 10 years for screening                            purposes.                           - Return to GI clinic PRN. Napoleon Form,  MD 11/06/2016 3:44:01 PM This report has been signed electronically.

## 2016-11-06 NOTE — Progress Notes (Signed)
Pt's states no medical or surgical changes since previsit or office visit. 

## 2016-11-07 ENCOUNTER — Telehealth: Payer: Self-pay | Admitting: *Deleted

## 2016-11-07 ENCOUNTER — Other Ambulatory Visit: Payer: Self-pay | Admitting: Nurse Practitioner

## 2016-11-07 DIAGNOSIS — R928 Other abnormal and inconclusive findings on diagnostic imaging of breast: Secondary | ICD-10-CM

## 2016-11-07 NOTE — Telephone Encounter (Signed)
Opened in error

## 2016-11-07 NOTE — Telephone Encounter (Signed)
  Follow up Call-  Call back number 11/06/2016  Post procedure Call Back phone  # [phone]  Permission to leave phone message Yes    No answer at # given.  Left message that we will call back this afternoon.

## 2016-11-07 NOTE — Telephone Encounter (Signed)
  Follow up Call-  Call back number 11/06/2016  Post procedure Call Back phone  # [phone]  Permission to leave phone message Yes   [facility]

## 2016-11-13 ENCOUNTER — Ambulatory Visit
Admission: RE | Admit: 2016-11-13 | Discharge: 2016-11-13 | Disposition: A | Discharge status: Home / Self Care | Payer: BLUE CROSS/BLUE SHIELD | Source: Ambulatory Visit | Attending: Nurse Practitioner | Admitting: Nurse Practitioner

## 2016-11-13 DIAGNOSIS — R928 Other abnormal and inconclusive findings on diagnostic imaging of breast: Secondary | ICD-10-CM

## 2016-11-14 ENCOUNTER — Ambulatory Visit (HOSPITAL_COMMUNITY): Payer: BLUE CROSS/BLUE SHIELD

## 2016-11-21 ENCOUNTER — Ambulatory Visit (HOSPITAL_COMMUNITY): Payer: BLUE CROSS/BLUE SHIELD

## 2016-11-23 ENCOUNTER — Telehealth (HOSPITAL_COMMUNITY): Payer: Self-pay | Admitting: *Deleted

## 2016-11-23 ENCOUNTER — Encounter (HOSPITAL_COMMUNITY): Payer: Self-pay | Admitting: *Deleted

## 2016-11-23 NOTE — Telephone Encounter (Signed)
Message  Received: 2 weeks ago  Message Contents  Dolores Patty, MD  Noralee Space, RN        No renal artery stenosis     VAS US RENAL ARTERY DUPLEX  Order: [id number]  Status:  Final result Visible to patient:  No (Not Released) Dx:  Essential hypertension  Notes recorded by Suezanne Cheshire, RN on 11/23/2016 at 2:06 PM EDT Called and left another message. Letter mailed today. ------  Notes recorded by Suezanne Cheshire, RN on 11/13/2016 at 2:24 PM EDT Called and left message for patient to call us back. ------  Notes recorded by Noralee Space, RN on 11/05/2016 at 12:00 PM EDT Left message to call back ------  Notes recorded by Dolores Patty, MD on 11/04/2016 at 12:27 PM EDT No renal artery stenosis

## 2016-12-05 ENCOUNTER — Ambulatory Visit (HOSPITAL_COMMUNITY)
Admission: RE | Admit: 2016-12-05 | Discharge: 2016-12-05 | Disposition: A | Discharge status: Home / Self Care | Payer: BLUE CROSS/BLUE SHIELD | Source: Ambulatory Visit | Attending: Internal Medicine | Admitting: Internal Medicine

## 2016-12-05 DIAGNOSIS — I2510 Atherosclerotic heart disease of native coronary artery without angina pectoris: Secondary | ICD-10-CM | POA: Insufficient documentation

## 2016-12-05 DIAGNOSIS — I110 Hypertensive heart disease with heart failure: Secondary | ICD-10-CM | POA: Diagnosis present

## 2016-12-05 DIAGNOSIS — I5022 Chronic systolic (congestive) heart failure: Secondary | ICD-10-CM | POA: Insufficient documentation

## 2016-12-05 MED ORDER — ISOSORB DINITRATE-HYDRALAZINE 20-37.5 MG PO TABS: 2.0000 | ORAL_TABLET | Freq: Three times a day (TID) | ORAL | 5 refills | Status: DC

## 2016-12-05 NOTE — Progress Notes (Signed)
HF MD: BENSIMHON  HPI:  Christine Wilson is a 56 year old AA female with history of hypertension and recently diagnosed systolic HF presents for post-hospital f/u.   Admitted in 11/17 with two-week of DOE. In ER, was found to have a blood pressure of 205/120. CT angiogram of the chest was performed which was negative for pulmonary embolism but show some interstitial edema with bilateral pleural effusions. Patient was admitted with acute CHF and uncontrolled hypertension patient was started on IV Lasix. Echo was done which show 15-20% of ejection fraction grade 1 diastolic dysfunction.   Right heart and left heart catheterization was done showed moderate nonobstructive CAD with 60% LAD lesion with borderline CO. Cardiac MRI showed EF of 22% diffuse hypokinesis normal right ventricular function, but no evidence of prior MIs, myocarditis or infiltrative disease. She was diuresed andstarted on Entresto,spironolactone and digoxin.  She returns today for pharmacist-led HF medication titration. At last HF clinic visit on 10/16/16, Bidil was increased from 1 tab BID to 1 tab TID. Overall feels well today. Able to do all ADLs and housework without any problem. No problem with going to store or walking up steps. BP still high despite reported compliance with medications. No dizziness.     . Shortness of breath/dyspnea on exertion? no  . Orthopnea/PND? no . Edema? no . Lightheadedness/dizziness? Yes - only upon standing too quickly  . Daily weights at home? Yes - stable ~147-150  . Blood pressure/heart rate monitoring at home? Yes - SBP ~147-150 mmHg range . Following low-sodium/fluid-restricted diet? yes  HF Medications: Carvedilol 3.125 mg PO BID Digoxin 0.125 mg PO daily Furosemide 40 mg PO daily PRN edema - none recently  Bidil 1 tablet PO TID Entresto 97-103 mg PO BID Spironolactone 25 mg PO daily   Has the patient been experiencing any side effects to the medications prescribed?  no  Does  the patient have any problems obtaining medications due to transportation or finances?   No- BCBS commercial, all meds no copay currently   Understanding of regimen: good Understanding of indications: good Potential of compliance: good Patient understands to avoid NSAIDs. Patient understands to avoid decongestants.    Pertinent Lab Values: . 10/16/16: Serum creatinine 0.97, BUN 10, Potassium 4.1, Sodium 141 . 07/11/16: Digoxin 0.6  . 05/31/16: BNP 731, Mag 2.3  Vital Signs: . Weight: 150 lb (dry weight: 150 lb) . Blood pressure: 150/90 mmHg  . Heart rate: 56 bpm    Assessment: 1. Chronicsystolic CHF improved (EF 55-60% on ECHO 10/16/16 improved from 15-20% on 06/02/2016), due to NICM (suspect HTN). NYHA class IIsymptoms.  - Volume status stable  - Increase Bidil to 2 tablets TID  - Continue carvedilol 3.125 mg BID, digoxin 0.125 mg daily (may consider d/c since EF normalized), Entresto 97-103 mg BID, spironolactone 25 mg daily and furosemide 40 mg daily PRN for edema/weight gain - Basic disease state pathophysiology, medication indication, mechanism and side effects reviewed at length with patient and he verbalized understanding 2. HTN  - BP still not at goal <130/80 mmHg  - Increase Bidil as above but may need to consider increase in spironolactone or addition of amlodipine at next visit if still not at goal 3. CAD, nonobstructive  - No CP  - Continue ASA and atorvastatin   Plan: 1) Medication changes: Based on clinical presentation, vital signs and recent labs will increase Bidil to 2 tablets TID 2) Labs: BMET/dig if continued at next visit 3) Follow-up: Dr. Gala Romney on 01/29/17   Cicero Duck  Linford Arnold, PharmD, BCPS, CPP Clinical Pharmacist Pager: [phone] Phone: [phone] 12/05/2016 3:06 PM

## 2016-12-05 NOTE — Patient Instructions (Signed)
It was great to see you today!  Please INCREASE your Bidil to 2 tablets THREE TIMES DAILY.   Please make an appointment with Dr. Gala Romney in 2 months.

## 2016-12-25 ENCOUNTER — Ambulatory Visit (INDEPENDENT_AMBULATORY_CARE_PROVIDER_SITE_OTHER): Payer: BLUE CROSS/BLUE SHIELD | Admitting: Nurse Practitioner

## 2016-12-25 ENCOUNTER — Encounter: Payer: Self-pay | Admitting: Nurse Practitioner

## 2016-12-25 ENCOUNTER — Other Ambulatory Visit (INDEPENDENT_AMBULATORY_CARE_PROVIDER_SITE_OTHER): Payer: BLUE CROSS/BLUE SHIELD

## 2016-12-25 VITALS — BP 142/74 | HR 96 | Temp 98.3°F | Ht 61.0 in | Wt 149.0 lb

## 2016-12-25 DIAGNOSIS — Z23 Encounter for immunization: Secondary | ICD-10-CM

## 2016-12-25 DIAGNOSIS — R748 Abnormal levels of other serum enzymes: Secondary | ICD-10-CM

## 2016-12-25 DIAGNOSIS — M62838 Other muscle spasm: Secondary | ICD-10-CM

## 2016-12-25 LAB — BASIC METABOLIC PANEL
BUN: 17 mg/dL (ref 6–23)
CO2: 28 mEq/L (ref 19–32)
Calcium: 9.6 mg/dL (ref 8.4–10.5)
Chloride: 109 mEq/L (ref 96–112)
Creatinine, Ser: 1.1 mg/dL (ref 0.40–1.20)
GFR: 65.71 mL/min (ref >60.00)
Glucose, Bld: 124 mg/dL — ABNORMAL HIGH (ref 70–99)
Potassium: 3.9 mEq/L (ref 3.5–5.1)
Sodium: 144 mEq/L (ref 135–145)

## 2016-12-25 LAB — MAGNESIUM: Magnesium: 2 mg/dL (ref 1.5–2.5)

## 2016-12-25 NOTE — Progress Notes (Signed)
Subjective:  Patient ID: Christine Wilson, female    DOB: 1961/03/13  Age: 56 y.o. MRN: [id number]  CC: Follow-up (6 mo fu/neck and hands cramp/tdap?)   Muscle Pain  This is a new problem. The current episode started more than 1 month ago. The problem occurs intermittently. The problem is unchanged. The pain is present in the neck, left hand and right hand. The pain is mild. Nothing aggravates the symptoms. Pertinent negatives include no chest pain, fatigue, fever, joint swelling, stiffness, sensory change, shortness of breath, swollen glands or wheezing. Past treatments include nothing. There is no swelling present. She has been behaving normally.  she has desk job with use computers.  Outpatient Medications Prior to Visit  Medication Sig Dispense Refill  . aspirin 81 MG EC tablet Take 1 tablet (81 mg total) by mouth daily. 30 tablet 11  . atorvastatin (LIPITOR) 40 MG tablet Take 1 tablet (40 mg total) by mouth daily at 6 PM. 30 tablet 11  . BIOTIN FORTE PO Take 1 tablet by mouth daily as needed (takes occassionaly).     . carvedilol (COREG) 3.125 MG tablet Take 3.125 mg by mouth 2 (two) times daily with a meal.    . digoxin (LANOXIN) 0.125 MG tablet Take 1 tablet (0.125 mg total) by mouth daily. 30 tablet 11  . furosemide (LASIX) 40 MG tablet Take 1 tablet (40 mg total) by mouth as needed for fluid or edema. 30 tablet 11  . Garlic 10 MG CAPS Take 10 mg by mouth daily.     . isosorbide-hydrALAZINE (BIDIL) 20-37.5 MG tablet Take 2 tablets by mouth 3 (three) times daily. 180 tablet 5  . Multiple Vitamins-Minerals (MULTIVITAMIN WITH MINERALS) tablet Take 1 tablet by mouth daily.    . sacubitril-valsartan (ENTRESTO) 97-103 MG Take 1 tablet by mouth 2 (two) times daily. 60 tablet 11  . spironolactone (ALDACTONE) 25 MG tablet Take 1 tablet (25 mg total) by mouth daily. 30 tablet 11  . vitamin E 100 UNIT capsule Take by mouth daily.     Facility-Administered Medications Prior to Visit  Medication  Dose Route Frequency Provider Last Rate Last Dose  . 0.9 %  sodium chloride infusion  500 mL Intravenous Continuous Nandigam, Kavitha V, MD        ROS See HPI  Objective:  BP (!) 142/74   Pulse 96   Temp 98.3 F (36.8 C)   Ht 5\' 1"  (1.549 m)   Wt 149 lb (67.6 kg)   SpO2 99%   BMI 28.15 kg/m   BP Readings from Last 3 Encounters:  12/25/16 (!) 142/74  12/05/16 (!) 150/90  11/06/16 102/66    Wt Readings from Last 3 Encounters:  12/25/16 149 lb (67.6 kg)  12/05/16 150 lb 9.6 oz (68.3 kg)  11/06/16 149 lb (67.6 kg)    Physical Exam  Constitutional: She is oriented to person, place, and time. No distress.  Neck: Normal range of motion. Neck supple. No JVD present. No thyromegaly present.  Cardiovascular: Normal rate, regular rhythm and normal heart sounds.   Pulmonary/Chest: Effort normal. No stridor.  Musculoskeletal: She exhibits no edema, tenderness or deformity.       Right shoulder: Normal.       Left shoulder: Normal.       Right wrist: Normal.       Left wrist: Normal.       Cervical back: Normal.       Right hand: Normal.  Left hand: Normal.  Lymphadenopathy:    She has no cervical adenopathy.  Neurological: She is alert and oriented to person, place, and time.  Skin: Skin is warm and dry.  Vitals reviewed.   Lab Results  Component Value Date   WBC 7.7 06/04/2016   HGB 13.5 06/04/2016   HCT 43.0 06/04/2016   PLT 289 06/04/2016   GLUCOSE 124 (H) 12/25/2016   CHOL 173 05/31/2016   TRIG 181 (H) 05/31/2016   HDL 54 05/31/2016   LDLCALC 83 05/31/2016   ALT 52 (H) 05/31/2016   AST 40 (H) 05/31/2016   NA 144 12/25/2016   K 3.9 12/25/2016   CL 109 12/25/2016   CREATININE 1.10 12/25/2016   BUN 17 12/25/2016   CO2 28 12/25/2016   TSH 2.67 05/31/2016   INR 1.04 06/04/2016   HGBA1C 6.3 (H) 06/02/2016    No results found.  Assessment & Plan:   Anetria was seen today for follow-up.  Diagnoses and all orders for this visit:  Muscle spasm -      Basic metabolic panel; Future -     Magnesium; Future -     CK Total (and CKMB); Future  Need for diphtheria-tetanus-pertussis (Tdap) vaccine -     Tdap vaccine greater than or equal to 7yo IM   I am having Ms. Mellott maintain her multivitamin with minerals, vitamin E, BIOTIN FORTE PO, Garlic, aspirin, spironolactone, digoxin, atorvastatin, sacubitril-valsartan, furosemide, carvedilol, and isosorbide-hydrALAZINE. We will continue to administer sodium chloride.  No orders of the defined types were placed in this encounter.   Follow-up: Return in about 6 months (around 06/26/2017) for CPE (fasting).  Alysia Penna, NP

## 2016-12-25 NOTE — Patient Instructions (Addendum)
Please go to basement for blood draw.  Consider cervical spine DG if symptoms worsen.

## 2016-12-26 LAB — CK TOTAL AND CKMB (NOT AT ARMC)
CK, MB: 2 ng/mL (ref 0.0–5.0)
Relative Index: 0.4 (ref 0.0–4.0)
Total CK: 506 U/L — ABNORMAL HIGH (ref 29–143)

## 2017-01-29 ENCOUNTER — Ambulatory Visit (HOSPITAL_COMMUNITY)
Admission: RE | Admit: 2017-01-29 | Discharge: 2017-01-29 | Disposition: A | Discharge status: Home / Self Care | Payer: BLUE CROSS/BLUE SHIELD | Source: Ambulatory Visit | Attending: Internal Medicine | Admitting: Internal Medicine

## 2017-01-29 ENCOUNTER — Encounter (HOSPITAL_COMMUNITY): Payer: Self-pay | Admitting: Internal Medicine

## 2017-01-29 VITALS — BP 142/78 | HR 82 | Wt 149.8 lb

## 2017-01-29 DIAGNOSIS — Z7982 Long term (current) use of aspirin: Secondary | ICD-10-CM | POA: Diagnosis not present

## 2017-01-29 DIAGNOSIS — I2510 Atherosclerotic heart disease of native coronary artery without angina pectoris: Secondary | ICD-10-CM | POA: Diagnosis not present

## 2017-01-29 DIAGNOSIS — I110 Hypertensive heart disease with heart failure: Secondary | ICD-10-CM | POA: Insufficient documentation

## 2017-01-29 DIAGNOSIS — I5042 Chronic combined systolic (congestive) and diastolic (congestive) heart failure: Secondary | ICD-10-CM | POA: Diagnosis not present

## 2017-01-29 DIAGNOSIS — I10 Essential (primary) hypertension: Secondary | ICD-10-CM | POA: Diagnosis not present

## 2017-01-29 DIAGNOSIS — Z79899 Other long term (current) drug therapy: Secondary | ICD-10-CM | POA: Diagnosis not present

## 2017-01-29 MED ORDER — AMLODIPINE BESYLATE 5 MG PO TABS: 5.0000 mg | ORAL_TABLET | Freq: Every day | ORAL | 11 refills | Status: DC

## 2017-01-29 NOTE — Patient Instructions (Signed)
Stop Digoxin  Start Amlodipine 5 mg daily  We will contact you in 6 months to schedule your next appointment.

## 2017-01-29 NOTE — Progress Notes (Signed)
ADVANCED HF CLINIC NOTE   Primary Cardiologist: Bensimhon  HPI:  Christine Wilson is a 56 year old female with history of hypertension, chronic combined systolic and diastolic CHF (EF 20-10%) in Nov. 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   Admitted in Nov. 2017 with two-week of DOE. In ER, was found to have a blood pressure of 205/120. CT angiogram of the chest was performed which was negative for pulmonary embolism but show some interstitial edema with bilateral pleural effusions. Patient was admitted with acute CHF and uncontrolled hypertension patient was started on IV Lasix. Echo was done which show 15-20% of ejection fraction grade 1 diastolic dysfunction.   Right heart and left heart catheterization 11/17 was done showed moderate nonobstructive CAD with 60% LAD lesion with borderline CO.  Cardiac MRI showed EF of 22% diffuse hypokinesis normal right ventricular function, but no evidence of prior MIs, myocarditis or infiltrative disease. She was diuresed and started on Entresto, spironolactone and digoxin.  She returns today for HF follow up.  Weights at home 147-149 pounds. Blood pressures at home are elevated with SBP in the 160-180's. Walking daily, no SOB. No orthopnea or PND. Denies chest pain, palpitations. Taking all medications, following a low salt diet, drinking more than 2L a day some days.     Echo 06/02/2016 EF 15-20%.  Echo 10/16/16 EF EF 55-60% (reviewed personally)    R/L heart cath 06/04/16 Findings:  Ao = 141/85 (105) LV =  141/11 RA = 4 RV = 29/1 PA = 34/12 (21) PCW = 8 Fick cardiac output/index = 2.6/1.6 Thermo CO/CI = 2.9/1.8 PVR = 5.0 WU SVR = 3087 FA sat = 95% PA sat = 42%, 44%  Assessment: 1. 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 Markedly depressed CO with severely elevated SVR 4. Low filling pressures     Past Medical History:  Diagnosis Date  . CHF (congestive heart failure) (HCC)  05/31/2016   cardiologist at [facility]  . Hypertension     Current Outpatient Prescriptions  Medication Sig Dispense Refill  . aspirin 81 MG EC tablet Take 1 tablet (81 mg total) by mouth daily. 30 tablet 11  . atorvastatin (LIPITOR) 40 MG tablet Take 1 tablet (40 mg total) by mouth daily at 6 PM. 30 tablet 11  . BIOTIN FORTE PO Take 1 tablet by mouth daily as needed (takes occassionaly).     . carvedilol (COREG) 3.125 MG tablet Take 3.125 mg by mouth 2 (two) times daily with a meal.    . digoxin (LANOXIN) 0.125 MG tablet Take 1 tablet (0.125 mg total) by mouth daily. 30 tablet 11  . Garlic 10 MG CAPS Take 10 mg by mouth daily.     . isosorbide-hydrALAZINE (BIDIL) 20-37.5 MG tablet Take 2 tablets by mouth 3 (three) times daily. 180 tablet 5  . Multiple Vitamins-Minerals (MULTIVITAMIN WITH MINERALS) tablet Take 1 tablet by mouth daily.    . sacubitril-valsartan (ENTRESTO) 97-103 MG Take 1 tablet by mouth 2 (two) times daily. 60 tablet 11  . spironolactone (ALDACTONE) 25 MG tablet Take 1 tablet (25 mg total) by mouth daily. 30 tablet 11  . vitamin E 100 UNIT capsule Take by mouth daily.    . furosemide (LASIX) 40 MG tablet Take 1 tablet (40 mg total) by mouth as needed for fluid or edema. (Patient not taking: Reported on 01/29/2017) 30 tablet 11   No current facility-administered medications for this encounter.  No Known Allergies    Social History   Social History  . Marital status: Divorced    Spouse name: N/A  . Number of children: N/A  . Years of education: N/A   Occupational History  . Not on file.   Social History Main Topics  . Smoking status: Never Smoker  . Smokeless tobacco: Never Used  . Alcohol use No  . Drug use: No  . Sexual activity: Not on file   Other Topics Concern  . Not on file   Social History Narrative  . No narrative on file      Family History  Problem Relation Age of Onset  . Diabetes Mother   . Hypertension Mother   . Heart disease Mother  40       a. Passed away due to MI  . Breast cancer Mother   . Heart disease Sister   . Diabetes Brother   . Cancer Maternal Grandmother   . Hypertension Maternal Grandmother   . Breast cancer Maternal Grandmother   . Breast cancer Maternal Aunt   . Cancer Paternal Aunt   . Colon cancer Neg Hx     Vitals:   01/29/17 1330  BP: (!) 142/78  Pulse: 82  SpO2: 99%  Weight: 149 lb 12 oz (67.9 kg)    PHYSICAL EXAM: General: well appearing female, NAD.  HEENT: Normal Neck: Supple, JVP flat.  Carotids 2+ bilat; no bruits. No lymphadenopathy or thryomegaly appreciated. Cor: PMI nondisplaced. Regular rate and rhythm. 2/6 SEM RUSB.  Lungs: Clear to auscultation.  Abdomen: Soft, non tender, non distended.  No hepatosplenomegaly. No bruits or masses. Bowel sounds present.  Extremities: no cyanosis, clubbing, rash. No edema.  Neuro: alert & oriented x 3, cranial nerves grossly intact. moves all 4 extremities w/o difficulty. Affect pleasant    ASSESSMENT & PLAN: 1. Chronic systolic HF: Onset 05/2016, EF 15-20%, cMRI 22%. Cath 05/2016 with no obstructive CAD. NICM related to HTN.  - NYHA I - Volume status stable.  - Continue Entresto 97/103 mg BID - Stop digoxin with recovered EF.  - Continue Spiro 25 mg daily. - Continue Bidil 2 tabs TID.  - Continue Coreg 3.125 mg BID  2. HTN:  - Home BP's with SBP in the 160-180's.  - Add amlodipine  daily.   3. Nonobstructive CAD: distal LAD with 60% stenosis - No signs or symptoms of ischemia - Continue ASA, statin and Coreg.   Follow up in 6 months. Recent BMET stable.    Little Ishikawa, NP-C  1:42 PM

## 2017-01-29 NOTE — Addendum Note (Signed)
Encounter addended by: Little Ishikawa, NP on: 01/29/2017  4:49 PM<BR>    Actions taken: LOS modified

## 2017-03-05 ENCOUNTER — Other Ambulatory Visit: Payer: Self-pay | Admitting: Nurse Practitioner

## 2017-03-05 DIAGNOSIS — I5041 Acute combined systolic (congestive) and diastolic (congestive) heart failure: Secondary | ICD-10-CM

## 2017-03-05 DIAGNOSIS — I160 Hypertensive urgency: Secondary | ICD-10-CM

## 2017-03-05 NOTE — Telephone Encounter (Signed)
Please advise, we never send this med in for pt.

## 2017-06-27 ENCOUNTER — Encounter: Payer: Self-pay | Admitting: Nurse Practitioner

## 2017-06-27 ENCOUNTER — Ambulatory Visit (INDEPENDENT_AMBULATORY_CARE_PROVIDER_SITE_OTHER): Payer: BLUE CROSS/BLUE SHIELD | Admitting: Nurse Practitioner

## 2017-06-27 VITALS — BP 138/68 | HR 63 | Temp 98.0°F | Ht 61.0 in | Wt 160.0 lb

## 2017-06-27 DIAGNOSIS — Z0000 Encounter for general adult medical examination without abnormal findings: Secondary | ICD-10-CM | POA: Diagnosis not present

## 2017-06-27 DIAGNOSIS — R739 Hyperglycemia, unspecified: Secondary | ICD-10-CM

## 2017-06-27 NOTE — Progress Notes (Signed)
Subjective:    Patient ID: Christine Wilson, female    DOB: 17-Oct-1960, 56 y.o.   MRN: [id number]  Patient presents today for complete physical   HPI  Denies any acute complain today.  Wt Readings from Last 3 Encounters:  06/27/17 160 lb (72.6 kg)  01/29/17 149 lb 12 oz (67.9 kg)  12/25/16 149 lb (67.6 kg)   Immunizations: (TDAP, Hep C screen, Pneumovax, Influenza, zoster)  Health Maintenance  Topic Date Due  . Flu Shot  02/27/2018*  . HIV Screening  06/27/2018*  . Mammogram  11/06/2018  . Pap Smear  06/30/2019  . Colon Cancer Screening  11/07/2026  . Tetanus Vaccine  12/26/2026  .  Hepatitis C: One time screening is recommended by Center for Disease Control  (CDC) for  adults born from [birth year] through 1965.   Completed  *Topic was postponed. The date shown is not the original due date.   Diet:regualr.  Weight:  Wt Readings from Last 3 Encounters:  06/27/17 160 lb (72.6 kg)  01/29/17 149 lb 12 oz (67.9 kg)  12/25/16 149 lb (67.6 kg)    Exercise:none.  Fall Risk: Fall Risk  06/27/2017 06/29/2016  Falls in the past year? No No   Home Safety: home alone.  Depression/Suicide: Depression screen [facility] 2/9 06/27/2017 06/29/2016 05/31/2016  Decreased Interest 0 0 0  Down, Depressed, Hopeless 0 0 0  PHQ - 2 Score 0 0 0   Vision:will schedule.  Dental:will schedule.  Advanced Directive:in formation provided. Advanced Directives 10/23/2016  Does Patient Have a Medical Advance Directive? No  Would patient like information on creating a medical advance directive? -   Medications and allergies reviewed with patient and updated if appropriate.  Patient Active Problem List   Diagnosis Date Noted  . Systolic and diastolic CHF, acute (HCC) 06/04/2016  . Impaired glucose tolerance 06/03/2016  . Acute systolic heart failure (HCC) 06/02/2016  . Acute exacerbation of CHF (congestive heart failure) (HCC) 06/02/2016  . Acute congestive heart failure (HCC) 06/01/2016  . Microcytic anemia  06/01/2016  . Hypertensive urgency 06/01/2016  . Dyspnea on exertion 06/01/2016    Current Outpatient Medications on File Prior to Visit  Medication Sig Dispense Refill  . aspirin 81 MG EC tablet Take 1 tablet (81 mg total) by mouth daily. 30 tablet 11  . atorvastatin (LIPITOR) 40 MG tablet Take 1 tablet (40 mg total) by mouth daily at 6 PM. 30 tablet 11  . BIOTIN FORTE PO Take 1 tablet by mouth daily as needed (takes occassionaly).     . carvedilol (COREG) 3.125 MG tablet Take 3.125 mg by mouth 2 (two) times daily with a meal.    . Garlic 10 MG CAPS Take 10 mg by mouth daily.     . isosorbide-hydrALAZINE (BIDIL) 20-37.5 MG tablet Take 2 tablets by mouth 3 (three) times daily. 180 tablet 5  . Multiple Vitamins-Minerals (MULTIVITAMIN WITH MINERALS) tablet Take 1 tablet by mouth daily.    . sacubitril-valsartan (ENTRESTO) 97-103 MG Take 1 tablet by mouth 2 (two) times daily. 60 tablet 11  . spironolactone (ALDACTONE) 25 MG tablet Take 1 tablet (25 mg total) by mouth daily. 30 tablet 11  . vitamin E 100 UNIT capsule Take by mouth daily.    Marland Kitchen amLODipine (NORVASC) 5 MG tablet Take 1 tablet (5 mg total) by mouth daily. 30 tablet 11   No current facility-administered medications on file prior to visit.     Past Medical History:  Diagnosis Date  .  CHF (congestive heart failure) (HCC) 05/31/2016   cardiologist at [facility]  . Hypertension     Past Surgical History:  Procedure Laterality Date  . CARDIAC CATHETERIZATION N/A 06/04/2016   Procedure: Right/Left Heart Cath and Coronary Angiography;  Surgeon: Dolores Pattyaniel R Bensimhon, MD;  [facility];  Service: Cardiovascular;  Laterality: N/A;  . CESAREAN SECTION      Social History   Socioeconomic History  . Marital status: Divorced    Spouse name: None  . Number of children: None  . Years of education: None  . Highest education level: None  Social Needs  . Financial resource strain: None  . Food insecurity - worry: None  .  Food insecurity - inability: None  . Transportation needs - medical: None  . Transportation needs - non-medical: None  Occupational History  . None  Tobacco Use  . Smoking status: Never Smoker  . Smokeless tobacco: Never Used  Substance and Sexual Activity  . Alcohol use: No  . Drug use: No  . Sexual activity: None  Other Topics Concern  . None  Social History Narrative  . None    Family History  Problem Relation Age of Onset  . Diabetes Mother   . Hypertension Mother   . Heart disease Mother [id number]       a. Passed away due to MI  . Breast cancer Mother   . Heart disease Sister   . Diabetes Brother   . Cancer Maternal Grandmother   . Hypertension Maternal Grandmother   . Breast cancer Maternal Grandmother   . Breast cancer Maternal Aunt   . Cancer Paternal Aunt   . Colon cancer Neg Hx         Review of Systems  Constitutional: Negative for fever, malaise/fatigue and weight loss.  HENT: Negative for congestion and sore throat.   Eyes:       Negative for visual changes  Respiratory: Negative for cough and shortness of breath.   Cardiovascular: Negative for chest pain, palpitations and leg swelling.  Gastrointestinal: Negative for blood in stool, constipation, diarrhea and heartburn.  Genitourinary: Negative for dysuria, frequency and urgency.  Musculoskeletal: Negative for falls, joint pain and myalgias.  Skin: Negative for rash.  Neurological: Negative for dizziness, sensory change and headaches.  Endo/Heme/Allergies: Does not bruise/bleed easily.  Psychiatric/Behavioral: Negative for depression, memory loss, substance abuse and suicidal ideas. The patient is not nervous/anxious and does not have insomnia.     Objective:   Vitals:   06/27/17 1018  BP: 138/68  Pulse: 63  Temp: 98 F (36.7 C)  SpO2: 99%    Body mass index is 30.23 kg/m.   Physical Examination:  Physical Exam  Constitutional: She is oriented to person, place, and time and well-developed,  well-nourished, and in no distress. No distress.  HENT:  Right Ear: External ear normal.  Left Ear: External ear normal.  Nose: Nose normal.  Mouth/Throat: Oropharynx is clear and moist. No oropharyngeal exudate.  Eyes: Conjunctivae and EOM are normal. Pupils are equal, round, and reactive to light. No scleral icterus.  Neck: Normal range of motion. Neck supple. No thyromegaly present.  Cardiovascular: Normal rate, normal heart sounds and intact distal pulses.  Pulmonary/Chest: Effort normal and breath sounds normal. She exhibits no tenderness.  Declined breast exam. Up to date with mammogram  Abdominal: Soft. Bowel sounds are normal. She exhibits no distension. There is no tenderness.  Genitourinary:  Genitourinary Comments: Up to date with PAP. No pelvic exam  necessary  Musculoskeletal: Normal range of motion. She exhibits no edema or tenderness.  Lymphadenopathy:    She has no cervical adenopathy.  Neurological: She is alert and oriented to person, place, and time. Gait normal.  Skin: Skin is warm and dry.  Psychiatric: Affect and judgment normal.  Vitals reviewed.   ASSESSMENT and PLAN:  Kaleb was seen today for annual exam.  Diagnoses and all orders for this visit:  Encounter for preventative adult health care examination -     Lipid panel; Future -     Basic metabolic panel; Future -     Hepatic function panel; Future -     TSH; Future -     Vitamin D 1,25 dihydroxy; Future -     CBC w/Diff; Future  Hyperglycemia -     Hemoglobin A1c; Future   No problem-specific Assessment & Plan notes found for this encounter.     Follow up: No Follow-up on file.  Alysia Penna, NP

## 2017-06-27 NOTE — Patient Instructions (Signed)
Please return to lab fasting within the next 5days. Need to be fasting at least 6-8hrs prior to blood draw. OK to drink water, black coffee and unsweetened tea.  Bring insurance form back to office when possible  Please start DASH diet and regular exercise (at least 30-79mns per day).  Health Maintenance, Female Adopting a healthy lifestyle and getting preventive care can go a long way to promote health and wellness. Talk with your health care provider about what schedule of regular examinations is right for you. This is a good chance for you to check in with your provider about disease prevention and staying healthy. In between checkups, there are plenty of things you can do on your own. Experts have done a lot of research about which lifestyle changes and preventive measures are most likely to keep you healthy. Ask your health care provider for more information. Weight and diet Eat a healthy diet  Be sure to include plenty of vegetables, fruits, low-fat dairy products, and lean protein.  Do not eat a lot of foods high in solid fats, added sugars, or salt.  Get regular exercise. This is one of the most important things you can do for your health. ? Most adults should exercise for at least 150 minutes each week. The exercise should increase your heart rate and make you sweat (moderate-intensity exercise). ? Most adults should also do strengthening exercises at least twice a week. This is in addition to the moderate-intensity exercise.  Maintain a healthy weight  Body mass index (BMI) is a measurement that can be used to identify possible weight problems. It estimates body fat based on height and weight. Your health care provider can help determine your BMI and help you achieve or maintain a healthy weight.  For females 253years of age and older: ? A BMI below 18.5 is considered underweight. ? A BMI of 18.5 to 24.9 is normal. ? A BMI of 25 to 29.9 is considered overweight. ? A BMI of 30  and above is considered obese.  Watch levels of cholesterol and blood lipids  You should start having your blood tested for lipids and cholesterol at 58years of age, then have this test every 5 years.  You may need to have your cholesterol levels checked more often if: ? Your lipid or cholesterol levels are high. ? You are older than 58years of age. ? You are at high risk for heart disease.  Cancer screening Lung Cancer  Lung cancer screening is recommended for adults 5815years old who are at high risk for lung cancer because of a history of smoking.  A yearly low-dose CT scan of the lungs is recommended for people who: ? Currently smoke. ? Have quit within the past 15 years. ? Have at least a 30-pack-year history of smoking. A pack year is smoking an average of one pack of cigarettes a day for 1 year.  Yearly screening should continue until it has been 15 years since you quit.  Yearly screening should stop if you develop a health problem that would prevent you from having lung cancer treatment.  Breast Cancer  Practice breast self-awareness. This means understanding how your breasts normally appear and feel.  It also means doing regular breast self-exams. Let your health care provider know about any changes, no matter how small.  If you are in your 20s or 30s, you should have a clinical breast exam (CBE) by a health care provider every 1-3 years as part  of a regular health exam.  If you are 30 or older, have a CBE every year. Also consider having a breast X-ray (mammogram) every year.  If you have a family history of breast cancer, talk to your health care provider about genetic screening.  If you are at high risk for breast cancer, talk to your health care provider about having an MRI and a mammogram every year.  Breast cancer gene (BRCA) assessment is recommended for women who have family members with BRCA-related cancers. BRCA-related cancers  include: ? Breast. ? Ovarian. ? Tubal. ? Peritoneal cancers.  Results of the assessment will determine the need for genetic counseling and BRCA1 and BRCA2 testing.  Cervical Cancer Your health care provider may recommend that you be screened regularly for cancer of the pelvic organs (ovaries, uterus, and vagina). This screening involves a pelvic examination, including checking for microscopic changes to the surface of your cervix (Pap test). You may be encouraged to have this screening done every 3 years, beginning at age 58.  For women ages 17-65, health care providers may recommend pelvic exams and Pap testing every 3 years, or they may recommend the Pap and pelvic exam, combined with testing for human papilloma virus (HPV), every 5 years. Some types of HPV increase your risk of cervical cancer. Testing for HPV may also be done on women of any age with unclear Pap test results.  Other health care providers may not recommend any screening for nonpregnant women who are considered low risk for pelvic cancer and who do not have symptoms. Ask your health care provider if a screening pelvic exam is right for you.  If you have had past treatment for cervical cancer or a condition that could lead to cancer, you need Pap tests and screening for cancer for at least 20 years after your treatment. If Pap tests have been discontinued, your risk factors (such as having a new sexual partner) need to be reassessed to determine if screening should resume. Some women have medical problems that increase the chance of getting cervical cancer. In these cases, your health care provider may recommend more frequent screening and Pap tests.  Colorectal Cancer  This type of cancer can be detected and often prevented.  Routine colorectal cancer screening usually begins at 58 years of age and continues through 58 years of age.  Your health care provider may recommend screening at an earlier age if you have risk factors  for colon cancer.  Your health care provider may also recommend using home test kits to check for hidden blood in the stool.  A small camera at the end of a tube can be used to examine your colon directly (sigmoidoscopy or colonoscopy). This is done to check for the earliest forms of colorectal cancer.  Routine screening usually begins at age 85.  Direct examination of the colon should be repeated every 5-10 years through 57 years of age. However, you may need to be screened more often if early forms of precancerous polyps or small growths are found.  Skin Cancer  Check your skin from head to toe regularly.  Tell your health care provider about any new moles or changes in moles, especially if there is a change in a mole's shape or color.  Also tell your health care provider if you have a mole that is larger than the size of a pencil eraser.  Always use sunscreen. Apply sunscreen liberally and repeatedly throughout the day.  Protect yourself by wearing long  sleeves, pants, a wide-brimmed hat, and sunglasses whenever you are outside.  Heart disease, diabetes, and high blood pressure  High blood pressure causes heart disease and increases the risk of stroke. High blood pressure is more likely to develop in: ? People who have blood pressure in the high end of the normal range (130-139/85-89 mm Hg). ? People who are overweight or obese. ? People who are African American.  If you are 93-79 years of age, have your blood pressure checked every 3-5 years. If you are 85 years of age or older, have your blood pressure checked every year. You should have your blood pressure measured twice-once when you are at a hospital or clinic, and once when you are not at a hospital or clinic. Record the average of the two measurements. To check your blood pressure when you are not at a hospital or clinic, you can use: ? An automated blood pressure machine at a pharmacy. ? A home blood pressure monitor.  If  you are between 74 years and 26 years old, ask your health care provider if you should take aspirin to prevent strokes.  Have regular diabetes screenings. This involves taking a blood sample to check your fasting blood sugar level. ? If you are at a normal weight and have a low risk for diabetes, have this test once every three years after 58 years of age. ? If you are overweight and have a high risk for diabetes, consider being tested at a younger age or more often. Preventing infection Hepatitis B  If you have a higher risk for hepatitis B, you should be screened for this virus. You are considered at high risk for hepatitis B if: ? You were born in a country where hepatitis B is common. Ask your health care provider which countries are considered high risk. ? Your parents were born in a high-risk country, and you have not been immunized against hepatitis B (hepatitis B vaccine). ? You have HIV or AIDS. ? You use needles to inject street drugs. ? You live with someone who has hepatitis B. ? You have had sex with someone who has hepatitis B. ? You get hemodialysis treatment. ? You take certain medicines for conditions, including cancer, organ transplantation, and autoimmune conditions.  Hepatitis C  Blood testing is recommended for: ? Everyone born from [birth year] through 1967. ? Anyone with known risk factors for hepatitis C.  Sexually transmitted infections (STIs)  You should be screened for sexually transmitted infections (STIs) including gonorrhea and chlamydia if: ? You are sexually active and are younger than 58 years of age. ? You are older than 58 years of age and your health care provider tells you that you are at risk for this type of infection. ? Your sexual activity has changed since you were last screened and you are at an increased risk for chlamydia or gonorrhea. Ask your health care provider if you are at risk.  If you do not have HIV, but are at risk, it may be recommended  that you take a prescription medicine daily to prevent HIV infection. This is called pre-exposure prophylaxis (PrEP). You are considered at risk if: ? You are sexually active and do not regularly use condoms or know the HIV status of your partner(s). ? You take drugs by injection. ? You are sexually active with a partner who has HIV.  Talk with your health care provider about whether you are at high risk of being infected with HIV. If  you choose to begin PrEP, you should first be tested for HIV. You should then be tested every 3 months for as long as you are taking PrEP. Pregnancy  If you are premenopausal and you may become pregnant, ask your health care provider about preconception counseling.  If you may become pregnant, take 400 to 800 micrograms (mcg) of folic acid every day.  If you want to prevent pregnancy, talk to your health care provider about birth control (contraception). Osteoporosis and menopause  Osteoporosis is a disease in which the bones lose minerals and strength with aging. This can result in serious bone fractures. Your risk for osteoporosis can be identified using a bone density scan.  If you are 51 years of age or older, or if you are at risk for osteoporosis and fractures, ask your health care provider if you should be screened.  Ask your health care provider whether you should take a calcium or vitamin D supplement to lower your risk for osteoporosis.  Menopause may have certain physical symptoms and risks.  Hormone replacement therapy may reduce some of these symptoms and risks. Talk to your health care provider about whether hormone replacement therapy is right for you. Follow these instructions at home:  Schedule regular health, dental, and eye exams.  Stay current with your immunizations.  Do not use any tobacco products including cigarettes, chewing tobacco, or electronic cigarettes.  If you are pregnant, do not drink alcohol.  If you are  breastfeeding, limit how much and how often you drink alcohol.  Limit alcohol intake to no more than 1 drink per day for nonpregnant women. One drink equals 12 ounces of beer, 5 ounces of wine, or 1 ounces of hard liquor.  Do not use street drugs.  Do not share needles.  Ask your health care provider for help if you need support or information about quitting drugs.  Tell your health care provider if you often feel depressed.  Tell your health care provider if you have ever been abused or do not feel safe at home. This information is not intended to replace advice given to you by your health care provider. Make sure you discuss any questions you have with your health care provider. Document Released: 01/22/2011 Document Revised: 12/15/2015 Document Reviewed: 04/12/2015 Elsevier Interactive Patient Education  [employer].

## 2017-07-29 ENCOUNTER — Other Ambulatory Visit (HOSPITAL_COMMUNITY): Payer: Self-pay | Admitting: Internal Medicine

## 2017-07-29 DIAGNOSIS — I160 Hypertensive urgency: Secondary | ICD-10-CM

## 2017-07-29 DIAGNOSIS — I5041 Acute combined systolic (congestive) and diastolic (congestive) heart failure: Secondary | ICD-10-CM

## 2017-08-04 ENCOUNTER — Other Ambulatory Visit (HOSPITAL_COMMUNITY): Payer: Self-pay | Admitting: Internal Medicine

## 2017-08-04 DIAGNOSIS — I160 Hypertensive urgency: Secondary | ICD-10-CM

## 2017-08-04 DIAGNOSIS — I5041 Acute combined systolic (congestive) and diastolic (congestive) heart failure: Secondary | ICD-10-CM

## 2017-08-07 ENCOUNTER — Telehealth (HOSPITAL_COMMUNITY): Payer: Self-pay | Admitting: Surgery

## 2017-08-07 NOTE — Telephone Encounter (Signed)
Ms. Christine Wilson called back to inquire about her BiDil.  Christine Wilson ([facility] Pharm D) tells me that prior authorization was denied and will require an appeal.  She will send appeal and get back with Christine Wilson after determination.  Christine Wilson instructed to get 10-15 days of BiDil to take while waiting so that she will not be without medication during that time.  She is agreeable to this plan.

## 2017-08-07 NOTE — Telephone Encounter (Signed)
Ms. Walloch called concerned about the cost of her BiDil.  She tells me that she was told by her pharmacy that [facility] "is not supporting the medication like they have in the past".  She was told her cost would be  $209.00 at [facility].  I will forward this message to our [facility] Pharm D --Elizabeth Palau for further investigation and possible financial assistance.

## 2017-08-08 ENCOUNTER — Encounter (HOSPITAL_COMMUNITY): Payer: Self-pay | Admitting: Pharmacist

## 2017-08-26 ENCOUNTER — Telehealth (HOSPITAL_COMMUNITY): Payer: Self-pay | Admitting: Pharmacist

## 2017-08-26 NOTE — Telephone Encounter (Signed)
Called [employer] who stated that the appeal for Bidil was invalid with no reasoning given. May need to switch to separate components.   Tyler Deis. Bonnye Fava, PharmD, BCPS, CPP Clinical Pharmacist Phone: [phone] 08/26/2017 10:25 AM

## 2017-09-10 ENCOUNTER — Other Ambulatory Visit (HOSPITAL_COMMUNITY): Payer: Self-pay | Admitting: Internal Medicine

## 2017-09-15 ENCOUNTER — Other Ambulatory Visit (HOSPITAL_COMMUNITY): Payer: Self-pay | Admitting: Internal Medicine

## 2017-09-15 DIAGNOSIS — I160 Hypertensive urgency: Secondary | ICD-10-CM

## 2017-09-15 DIAGNOSIS — I5041 Acute combined systolic (congestive) and diastolic (congestive) heart failure: Secondary | ICD-10-CM

## 2017-09-21 ENCOUNTER — Other Ambulatory Visit (HOSPITAL_COMMUNITY): Payer: Self-pay | Admitting: Internal Medicine

## 2017-09-25 ENCOUNTER — Other Ambulatory Visit (HOSPITAL_COMMUNITY): Payer: Self-pay | Admitting: Internal Medicine

## 2017-11-09 ENCOUNTER — Other Ambulatory Visit (HOSPITAL_COMMUNITY): Payer: Self-pay | Admitting: Internal Medicine

## 2017-11-09 DIAGNOSIS — I5041 Acute combined systolic (congestive) and diastolic (congestive) heart failure: Secondary | ICD-10-CM

## 2017-11-09 DIAGNOSIS — I160 Hypertensive urgency: Secondary | ICD-10-CM

## 2017-11-11 ENCOUNTER — Other Ambulatory Visit (HOSPITAL_COMMUNITY): Payer: Self-pay | Admitting: Cardiology

## 2017-11-17 ENCOUNTER — Other Ambulatory Visit (HOSPITAL_COMMUNITY): Payer: Self-pay | Admitting: Internal Medicine

## 2017-11-17 DIAGNOSIS — I5041 Acute combined systolic (congestive) and diastolic (congestive) heart failure: Secondary | ICD-10-CM

## 2017-11-17 DIAGNOSIS — I160 Hypertensive urgency: Secondary | ICD-10-CM

## 2017-12-09 ENCOUNTER — Ambulatory Visit (HOSPITAL_COMMUNITY)
Admission: RE | Admit: 2017-12-09 | Discharge: 2017-12-09 | Disposition: A | Discharge status: Home / Self Care | Payer: BLUE CROSS/BLUE SHIELD | Source: Ambulatory Visit | Attending: Internal Medicine | Admitting: Internal Medicine

## 2017-12-09 ENCOUNTER — Encounter (HOSPITAL_COMMUNITY): Payer: Self-pay | Admitting: *Deleted

## 2017-12-09 ENCOUNTER — Encounter (HOSPITAL_COMMUNITY): Payer: Self-pay | Admitting: Internal Medicine

## 2017-12-09 VITALS — BP 164/76 | HR 93 | Wt 159.0 lb

## 2017-12-09 DIAGNOSIS — Z79899 Other long term (current) drug therapy: Secondary | ICD-10-CM | POA: Diagnosis not present

## 2017-12-09 DIAGNOSIS — I429 Cardiomyopathy, unspecified: Secondary | ICD-10-CM | POA: Insufficient documentation

## 2017-12-09 DIAGNOSIS — I110 Hypertensive heart disease with heart failure: Secondary | ICD-10-CM | POA: Insufficient documentation

## 2017-12-09 DIAGNOSIS — I10 Essential (primary) hypertension: Secondary | ICD-10-CM | POA: Diagnosis not present

## 2017-12-09 DIAGNOSIS — I2510 Atherosclerotic heart disease of native coronary artery without angina pectoris: Secondary | ICD-10-CM | POA: Insufficient documentation

## 2017-12-09 DIAGNOSIS — Z7982 Long term (current) use of aspirin: Secondary | ICD-10-CM | POA: Insufficient documentation

## 2017-12-09 DIAGNOSIS — I5022 Chronic systolic (congestive) heart failure: Secondary | ICD-10-CM | POA: Diagnosis not present

## 2017-12-09 DIAGNOSIS — Z800 Family history of malignant neoplasm of digestive organs: Secondary | ICD-10-CM | POA: Diagnosis not present

## 2017-12-09 DIAGNOSIS — I5042 Chronic combined systolic (congestive) and diastolic (congestive) heart failure: Secondary | ICD-10-CM | POA: Diagnosis not present

## 2017-12-09 LAB — BASIC METABOLIC PANEL
Anion gap: 9 (ref 5–15)
BUN: 9 mg/dL (ref 6–20)
CO2: 26 mmol/L (ref 22–32)
Calcium: 9.5 mg/dL (ref 8.9–10.3)
Chloride: 107 mmol/L (ref 101–111)
Creatinine, Ser: 0.87 mg/dL (ref 0.44–1.00)
GFR calc Af Amer: >60 mL/min (ref >60)
GFR calc non Af Amer: >60 mL/min (ref >60)
Glucose, Bld: 142 mg/dL — ABNORMAL HIGH (ref 65–99)
Potassium: 3.4 mmol/L — ABNORMAL LOW (ref 3.5–5.1)
Sodium: 142 mmol/L (ref 135–145)

## 2017-12-09 MED ORDER — AMLODIPINE BESYLATE 10 MG PO TABS: 10.0000 mg | ORAL_TABLET | Freq: Every day | ORAL | 11 refills | Status: DC

## 2017-12-09 NOTE — Progress Notes (Signed)
ADVANCED HF CLINIC NOTE   Primary Cardiologist: Furman Trentman  HPI:  Christine Wilson is a 57 year old female with history of hypertension, chronic combined systolic and diastolic CHF (EF 16-10%) in Nov. 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 Last echo 11/18 EF 55-60%   Admitted in Nov. 2017 with two-week of DOE. In ER, was found to have a blood pressure of 205/120. CT angiogram of the chest was performed which was negative for pulmonary embolism but show some interstitial edema with bilateral pleural effusions. Patient was admitted with acute CHF and uncontrolled hypertension patient was started on IV Lasix. Echo was done which show 15-20% of ejection fraction grade 1 diastolic dysfunction.   Right heart and left heart catheterization 11/17 was done showed moderate nonobstructive CAD with 60% LAD lesion with borderline CO.  Cardiac MRI showed EF of 22% diffuse hypokinesis normal right ventricular function, but no evidence of prior MIs, myocarditis or infiltrative disease. She was diuresed and started on Entresto, spironolactone and digoxin.   She returns today for HF follow up.  At last visit amlodipine added for HTN. Feels good. Remains active. Walking daily.  Follows BP closely at home SBP 120-140 but occasionaly 150-160s. One reading up to 205. Weight stable. No SOB, CP, edema.     Echo 06/02/2016 EF 15-20%.  Echo 10/16/16 EF EF 55-60% (reviewed personally)    R/L heart cath 06/04/16 Findings:  Ao = 141/85 (105) LV =  141/11 RA = 4 RV = 29/1 PA = 34/12 (21) PCW = 8 Fick cardiac output/index = 2.6/1.6 Thermo CO/CI = 2.9/1.8 PVR = 5.0 WU SVR = 3087 FA sat = 95% PA sat = 42%, 44%  Assessment: 1. 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 Markedly depressed CO with severely elevated SVR 4. Low filling pressures     Past Medical History:  Diagnosis Date  . CHF (congestive heart failure) (HCC) 05/31/2016   cardiologist at  [facility]  . Hypertension     Current Outpatient Medications  Medication Sig Dispense Refill  . amLODipine (NORVASC) 5 MG tablet Take 1 tablet (5 mg total) by mouth daily. 30 tablet 11  . aspirin 81 MG EC tablet Take 1 tablet (81 mg total) by mouth daily. 30 tablet 11  . atorvastatin (LIPITOR) 40 MG tablet Take 1 tablet (40 mg total) by mouth daily at 6 PM. 30 tablet 11  . BIOTIN FORTE PO Take 1 tablet by mouth daily as needed (takes occassionaly).     . carvedilol (COREG) 3.125 MG tablet TAKE 1 TABLET BY MOUTH TWICE DAILY **NEEDS  OFFICE  VISIT  FOR  REFILLS** 60 tablet 1  . ENTRESTO 97-103 MG TAKE 1 TABLET BY MOUTH TWICE DAILY 60 tablet 0  . Garlic 10 MG CAPS Take 10 mg by mouth daily.     . isosorbide-hydrALAZINE (BIDIL) 20-37.5 MG tablet Take 2 tablets by mouth 2 (two) times daily.    . Multiple Vitamins-Minerals (MULTIVITAMIN WITH MINERALS) tablet Take 1 tablet by mouth daily.    Marland Kitchen spironolactone (ALDACTONE) 25 MG tablet Take 1 tablet (25 mg total) by mouth daily. 30 tablet 11  . vitamin E 100 UNIT capsule Take by mouth daily.     No current facility-administered medications for this encounter.     No Known Allergies    Social History   Socioeconomic History  . Marital status: Divorced    Spouse name: Not on file  . Number of children: Not  on file  . Years of education: Not on file  . Highest education level: Not on file  Occupational History  . Not on file  Social Needs  . Financial resource strain: Not on file  . Food insecurity:    Worry: Not on file    Inability: Not on file  . Transportation needs:    Medical: Not on file    Non-medical: Not on file  Tobacco Use  . Smoking status: Never Smoker  . Smokeless tobacco: Never Used  Substance and Sexual Activity  . Alcohol use: No  . Drug use: No  . Sexual activity: Not on file  Lifestyle  . Physical activity:    Days per week: Not on file    Minutes per session: Not on file  . Stress: Not on file  Relationships    . Social connections:    Talks on phone: Not on file    Gets together: Not on file    Attends religious service: Not on file    Active member of club or organization: Not on file    Attends meetings of clubs or organizations: Not on file    Relationship status: Not on file  . Intimate partner violence:    Fear of current or ex partner: Not on file    Emotionally abused: Not on file    Physically abused: Not on file    Forced sexual activity: Not on file  Other Topics Concern  . Not on file  Social History Narrative  . Not on file      Family History  Problem Relation Age of Onset  . Diabetes Mother   . Hypertension Mother   . Heart disease Mother 11       a. Passed away due to MI  . Breast cancer Mother   . Heart disease Sister   . Diabetes Brother   . Cancer Maternal Grandmother   . Hypertension Maternal Grandmother   . Breast cancer Maternal Grandmother   . Breast cancer Maternal Aunt   . Cancer Paternal Aunt   . Colon cancer Neg Hx     Vitals:   12/09/17 1358  BP: (!) 164/76  Pulse: 93  SpO2: 98%  Weight: 159 lb (72.1 kg)    PHYSICAL EXAM: General:  Well appearing. No resp difficulty HEENT: normal Neck: supple. no JVD. Carotids 2+ bilat; no bruits. No lymphadenopathy or thryomegaly appreciated. Cor: PMI nondisplaced. Regular rate & rhythm. No rubs, gallops or murmurs. Lungs: clear Abdomen: soft, nontender, nondistended. No hepatosplenomegaly. No bruits or masses. Good bowel sounds. Extremities: no cyanosis, clubbing, rash, edema Neuro: alert & orientedx3, cranial nerves grossly intact. moves all 4 extremities w/o difficulty. Affect pleasant  ASSESSMENT & PLAN: 1. Chronic systolic HF: Onset 05/2016, EF 15-20%, cMRI 22%. Cath 05/2016 with no obstructive CAD. NICM related to HTN.  - Echo 3/18 EF 55-65% - NYHA I - Volume status looks good  - Continue Entresto 97/103 mg BID - Continue Spiro 25 mg daily. - Continue Bidil 2 tabs TID.  - Continue Coreg 3.125  mg BID - repeat echo to ensure EF stability  - Check BMET  2. HTN:  - BP overall much improved but still elevated at times  - Increase amlodipine 10mg  daily.   3. Nonobstructive CAD: distal LAD with 60% stenosis - No s/s of ischemia - Continue ASA, statin and Coreg.   Arvilla Meres, MD  2:18 PM

## 2017-12-09 NOTE — Addendum Note (Signed)
Encounter addended by: Noralee Space, RN on: 12/09/2017 2:26 PM  Actions taken: Diagnosis association updated, Pharmacy for encounter modified, Order list changed, Sign clinical note

## 2017-12-09 NOTE — Patient Instructions (Signed)
Increase Amlodipine to 10 mg daily  Labs today  We will contact you in 6 months to schedule your next appointment.  

## 2017-12-09 NOTE — Progress Notes (Signed)
Completed pt's [employer] forms and faxed in to [employer], pt aware and was given original, copy made to scan into chart.

## 2017-12-13 ENCOUNTER — Telehealth (HOSPITAL_COMMUNITY): Payer: Self-pay | Admitting: *Deleted

## 2017-12-13 DIAGNOSIS — I5022 Chronic systolic (congestive) heart failure: Secondary | ICD-10-CM

## 2017-12-13 MED ORDER — POTASSIUM CHLORIDE CRYS ER 20 MEQ PO TBCR: 20.0000 meq | EXTENDED_RELEASE_TABLET | Freq: Every day | ORAL | 6 refills | Status: DC

## 2017-12-13 NOTE — Telephone Encounter (Signed)
-----   Message from Dolores Patty, MD sent at 12/11/2017 10:16 PM EDT ----- Add 20 kcl daily. Take 2 the first day. Repeat 1-2 weeks

## 2017-12-13 NOTE — Telephone Encounter (Signed)
Notes recorded by Noralee Space, RN on 12/13/2017 at 5:16 PM EDT Pt aware, agreeable and verbalizes understanding, rx sent in, repeat labs sch for 12/24/17

## 2017-12-23 IMAGING — CT CT ANGIO CHEST
2 of 6 series · 18 of 36 positions shown · IV contrast (isovue)
Comparison: None.

CLINICAL DATA: Shortness of breath for 3 weeks. No chest pain.
Nonsmoker.

EXAM:
CT ANGIOGRAPHY CHEST WITH CONTRAST
TECHNIQUE: Multidetector CT imaging of the chest was performed using the
standard protocol during bolus administration of intravenous
contrast. Multiplanar CT image reconstructions and MIPs were
obtained to evaluate the vascular anatomy.
CONTRAST:  70 mL Isovue 370

[Series 6: pe thins · axial · 0.60mm/px · z∈[+893,+1141]mm · 17 of 280 slices shown]
[im 16/280  lung]
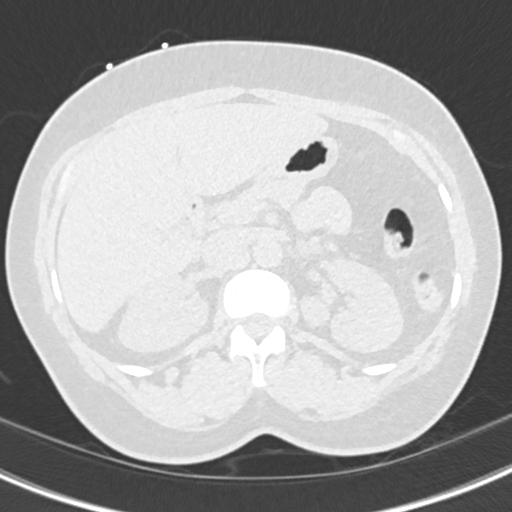
[im 32/280  mediastinal]
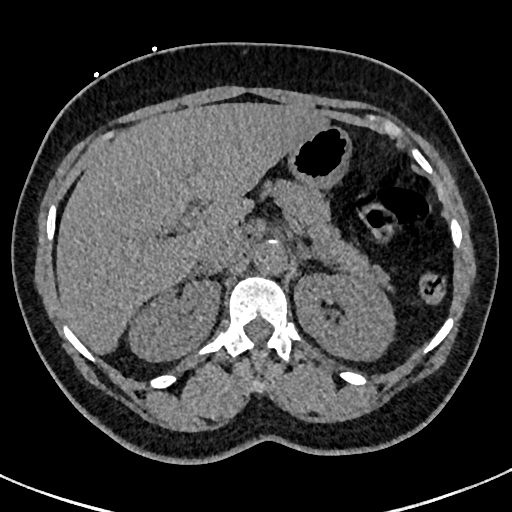
[im 47/280  lung]
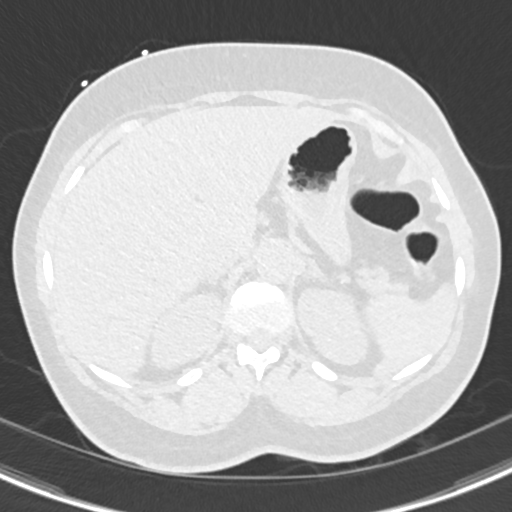
[im 63/280  mediastinal]
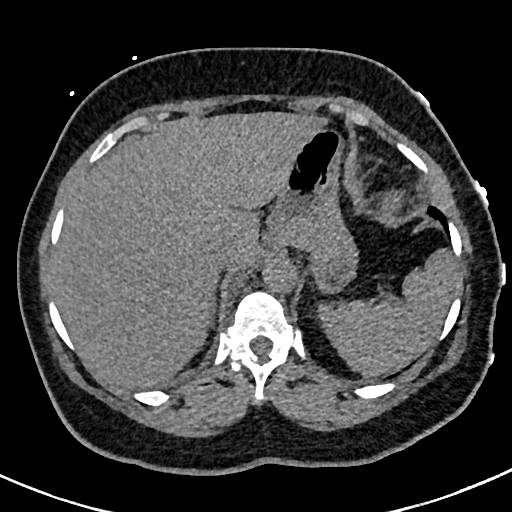
[im 78/280  lung]
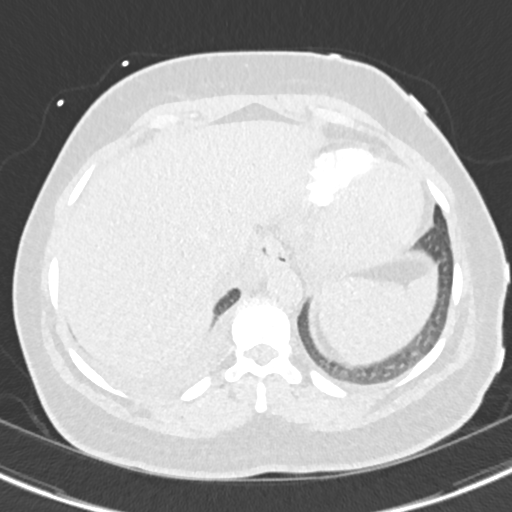
[im 94/280  mediastinal]
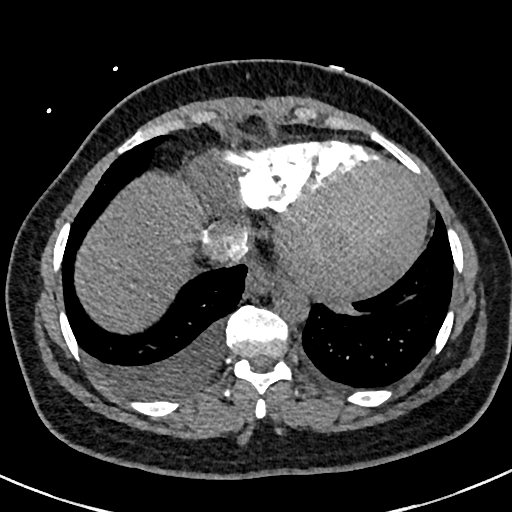
[im 109/280  lung]
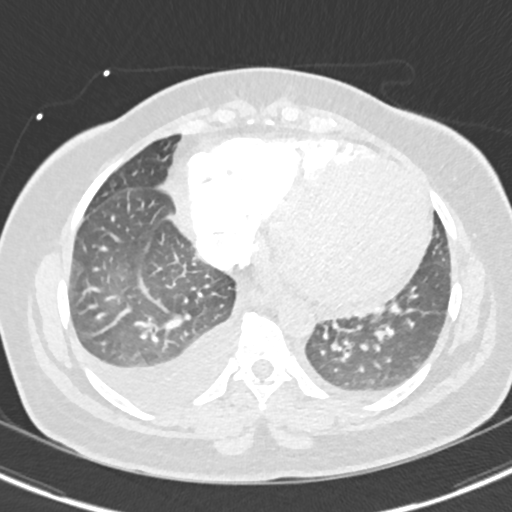
[im 125/280  mediastinal]
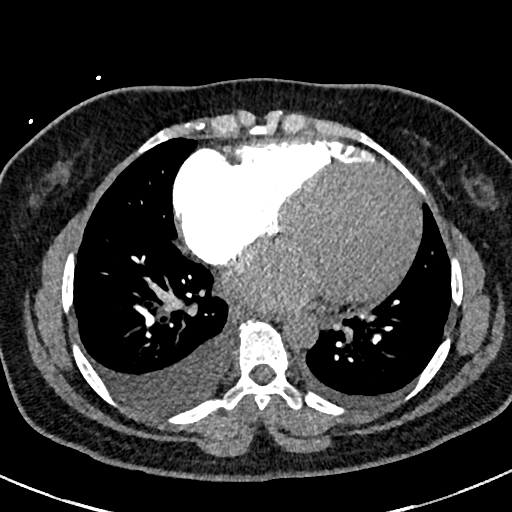
[im 140/280  lung]
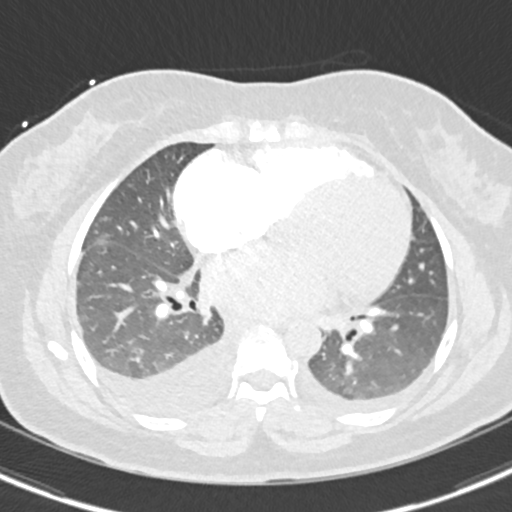
[im 156/280  mediastinal]
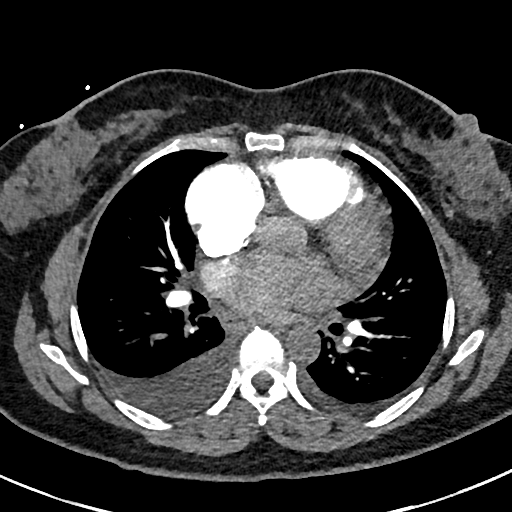
[im 171/280  lung]
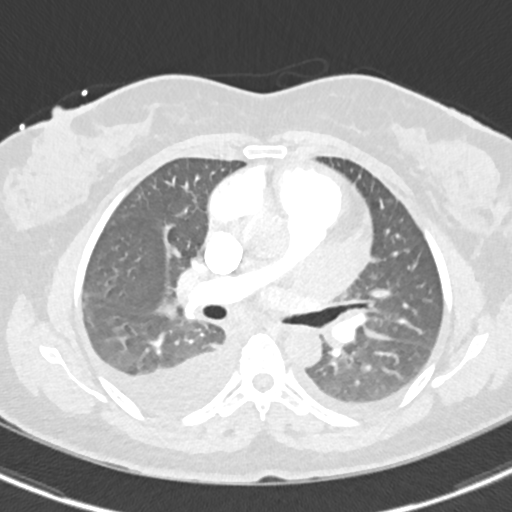
[im 187/280  mediastinal]
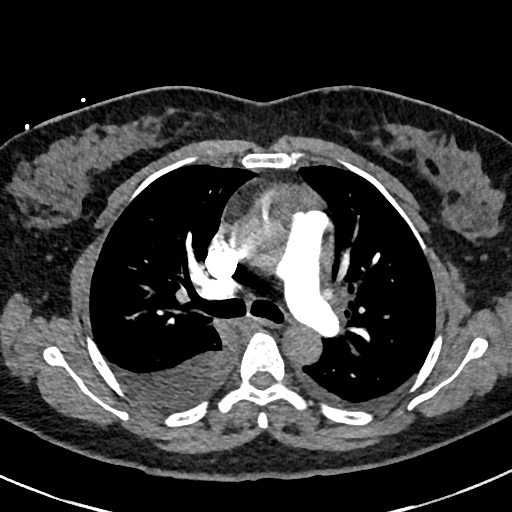
[im 202/280  lung]
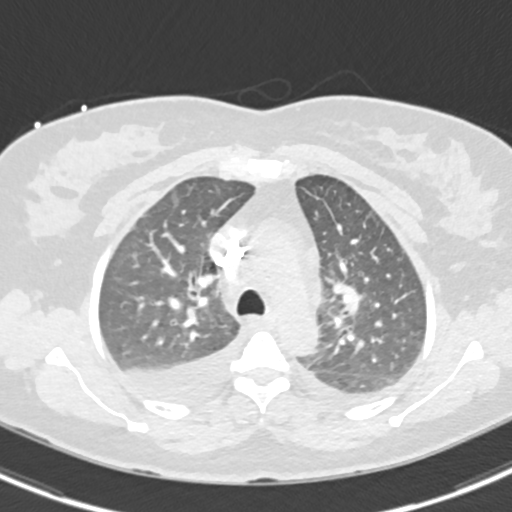
[im 218/280  mediastinal]
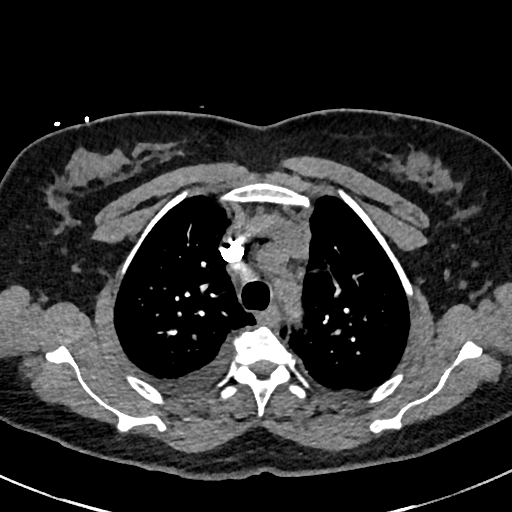
[im 233/280  lung]
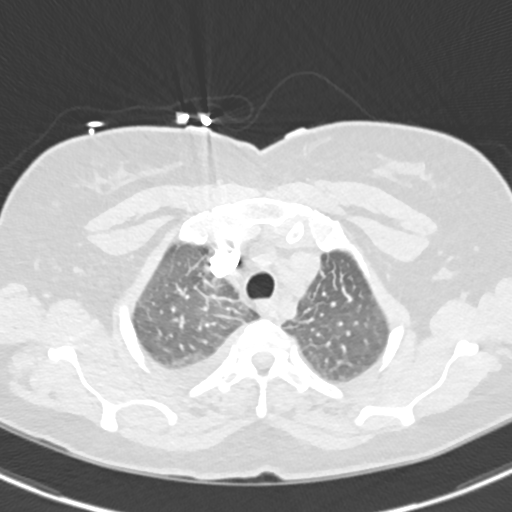
[im 249/280  mediastinal]
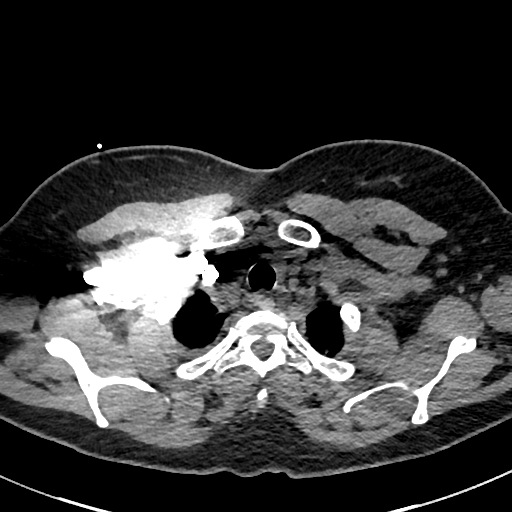
[im 264/280  lung]
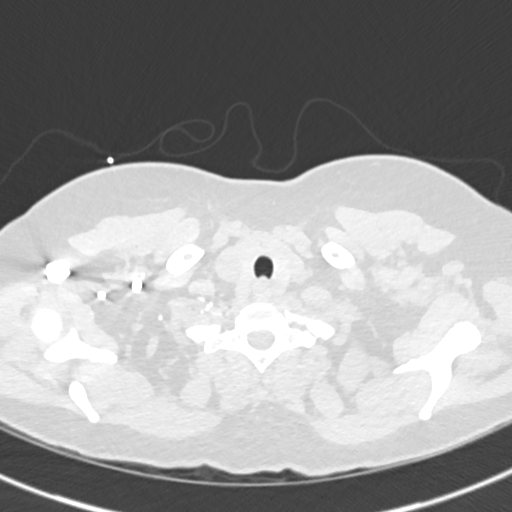

[Series 8: pe 2mm cor · coronal · 0.59mm/px · 1 of 119 slices shown]
[im 60/119  mediastinal]
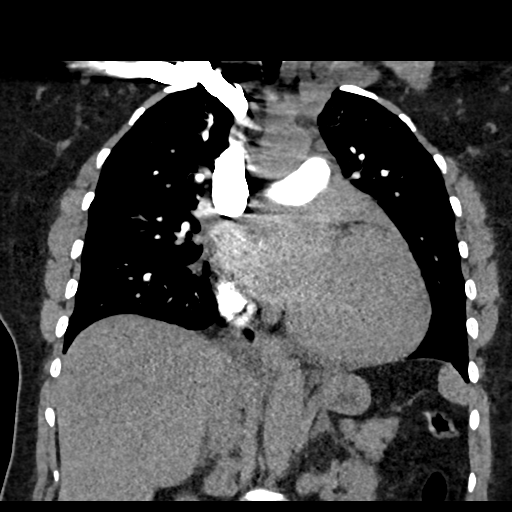

[18 of 36 positions shown; findings below may reference images not displayed]

FINDINGS: Cardiovascular: Technically adequate study with good opacification
of the central and segmental pulmonary arteries. No focal filling
defects. No evidence of significant pulmonary embolus. Diffuse
cardiac enlargement. Small pericardial effusion. Normal caliber
thoracic aorta.

Mediastinum/Nodes: Mediastinal lymph nodes are mildly prominent
diffusely without pathologic enlargement, likely reactive. Esophagus
is decompressed. Small esophageal hiatal hernia.

Lungs/Pleura: Small bilateral pleural effusions with basilar
atelectasis, greater on the right. Diffuse interstitial pattern to
the lungs consistent with interstitial edema. Diffuse bronchial wall
thickening consistent with acute or chronic bronchitis. Airways are
patent. No pneumothorax.

Upper Abdomen: No acute abnormality.

Musculoskeletal: Degenerative changes in the spine. No destructive
bone lesions.

Review of the MIP images confirms the above findings.
IMPRESSION: No evidence of significant pulmonary embolus. Cardiac enlargement
with bilateral pleural effusions and diffuse interstitial edema
consistent with congestive failure. Bronchial wall thickening may
indicate acute or chronic bronchitis.

## 2017-12-23 IMAGING — DX DG CHEST 2V
2 series · 2 of 2 positions shown · non-contrast
Comparison: None.

CLINICAL DATA: Cough and dyspnea for 2 weeks

EXAM:
CHEST  2 VIEW

[chest pa]
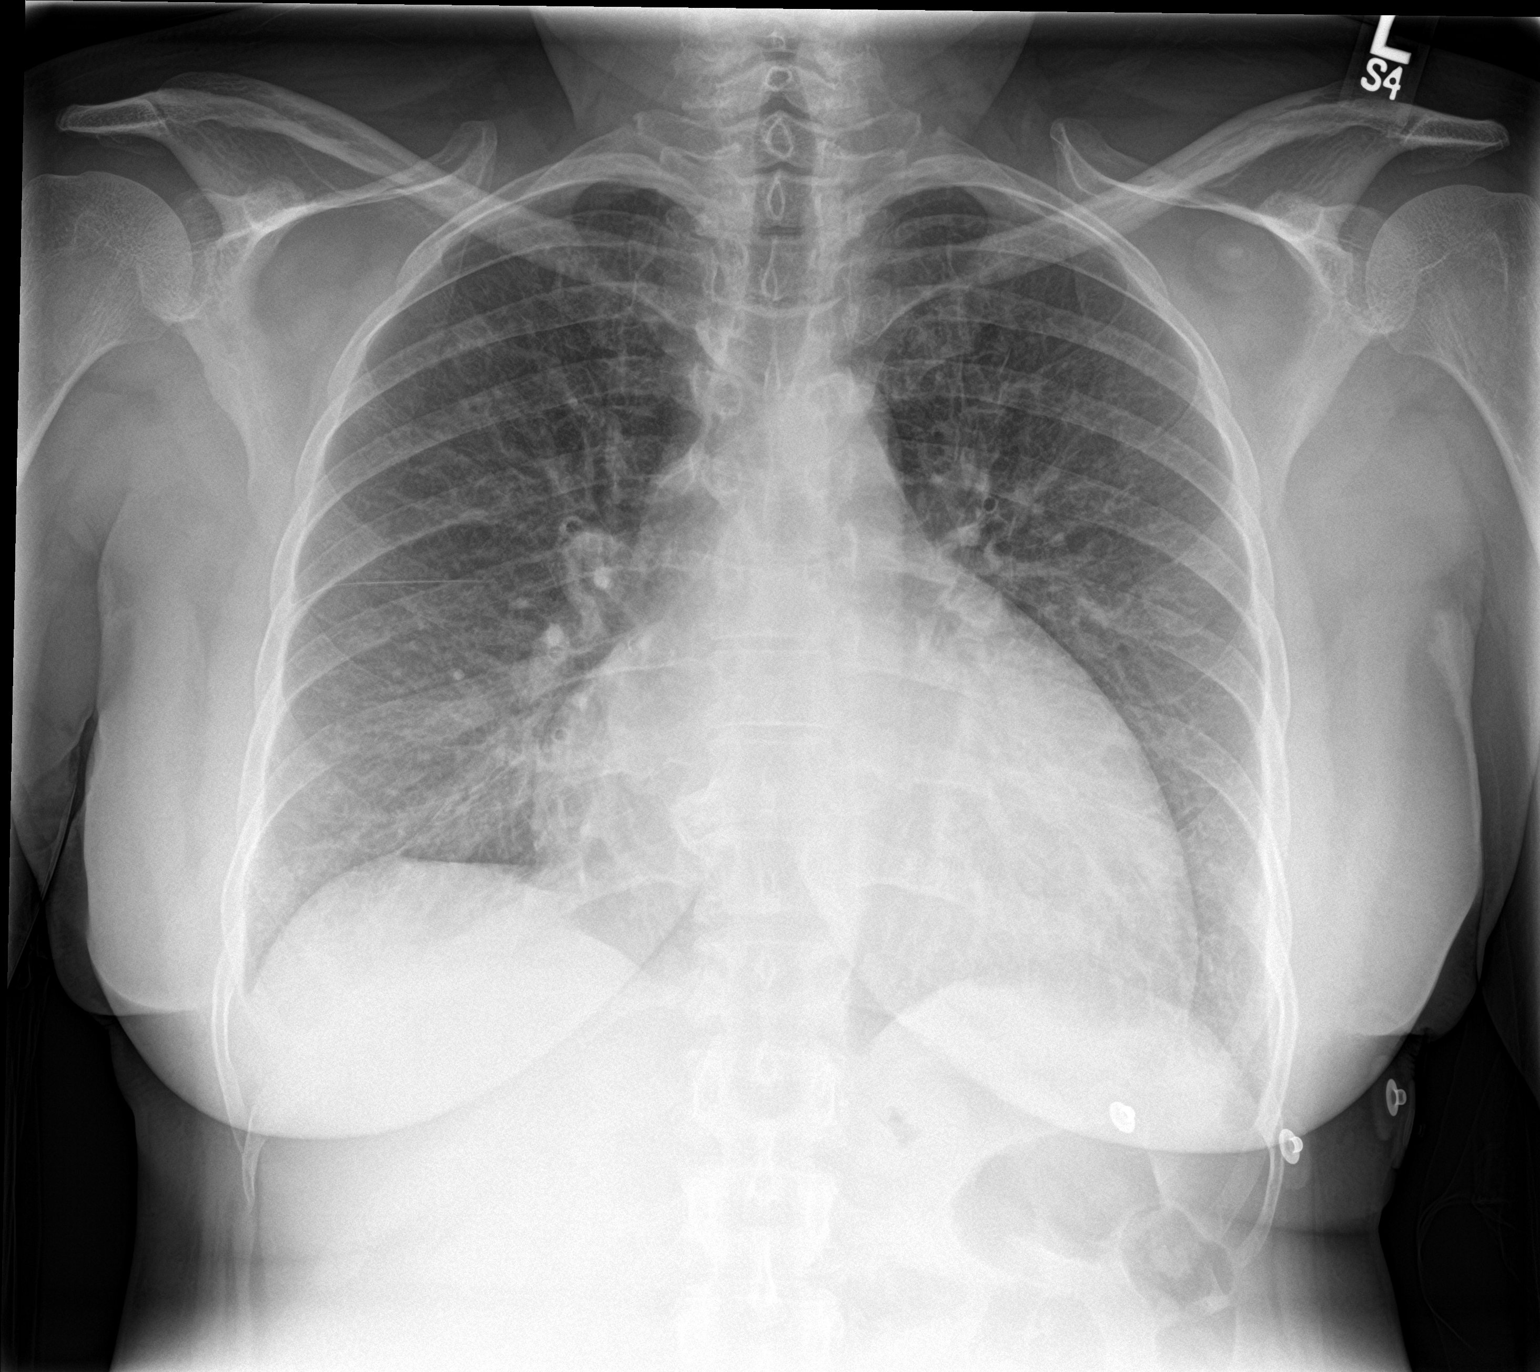

[chest lat]
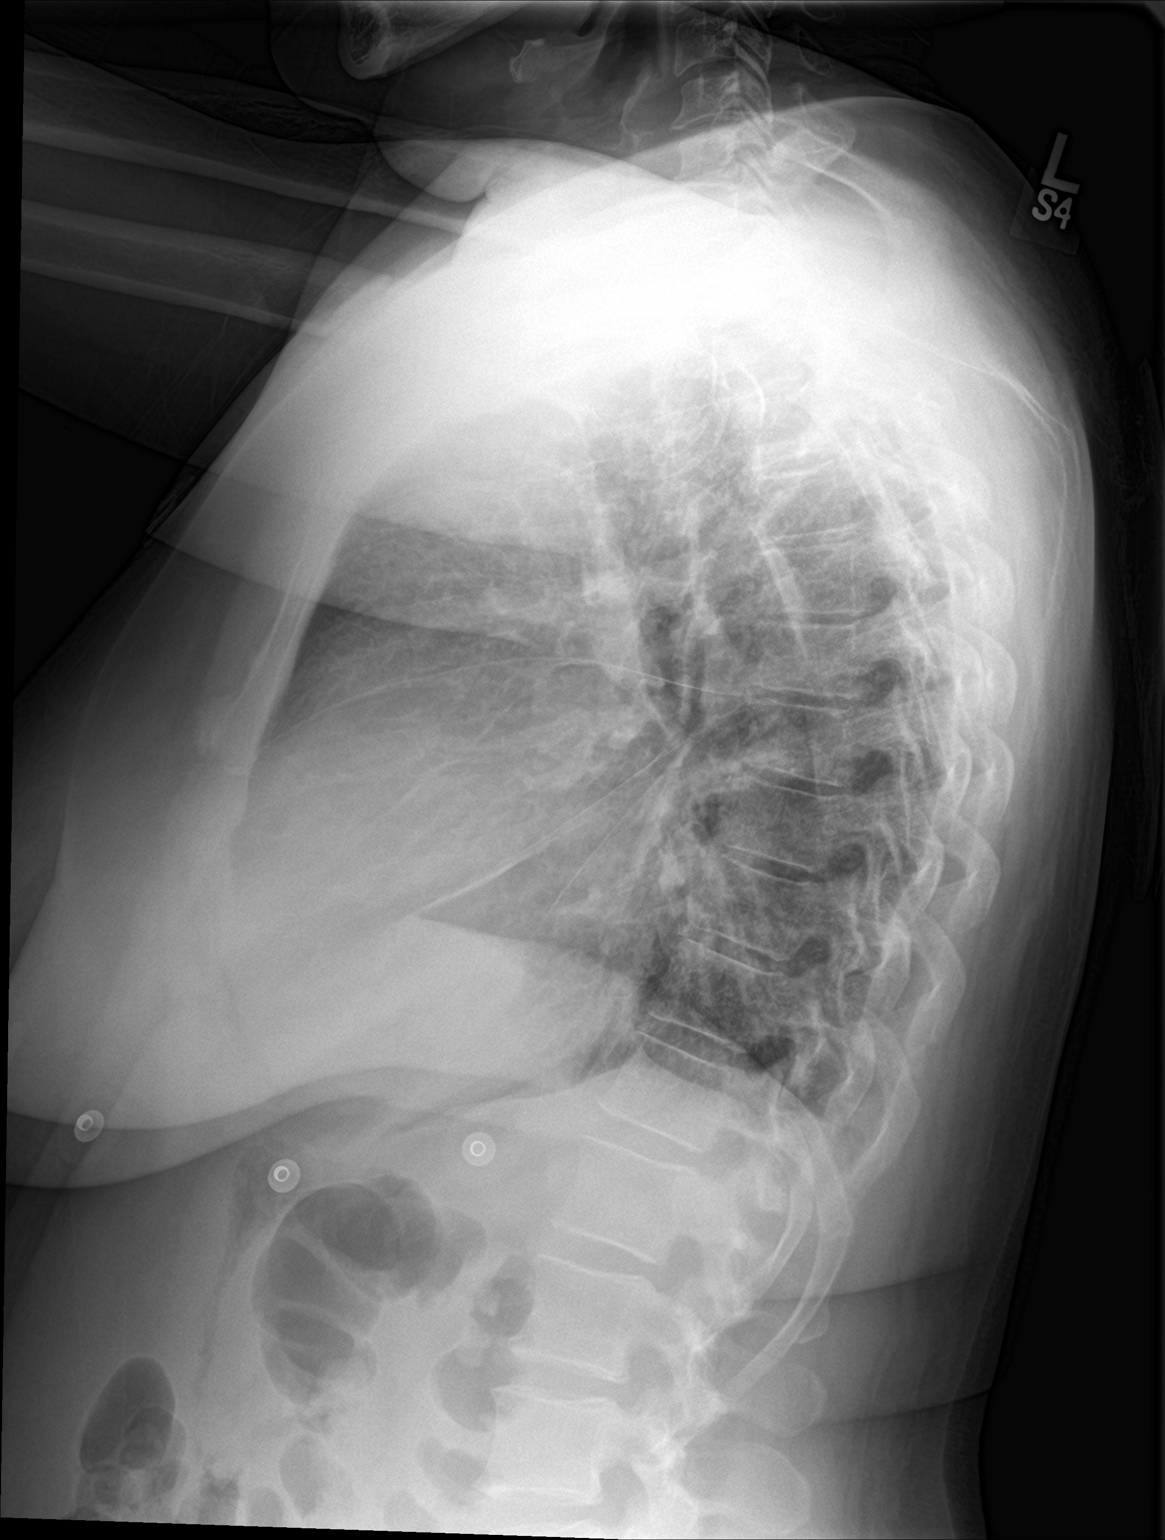

[2 of 2 positions shown; findings below may reference images not displayed]

FINDINGS: The heart is enlarged. There is aortic atherosclerosis. There is
mild diffuse interstitial prominence with minimal peribronchial
thickening suggestive of small airway inflammation/ bronchitic
change. No pneumonic consolidation, effusion or pulmonary edema. No
suspicious osseous lesions.
IMPRESSION: Findings suggest mild bronchitic change of the lungs without
pneumonic consolidation, CHF, effusion or pneumothorax.

## 2017-12-24 ENCOUNTER — Ambulatory Visit (HOSPITAL_COMMUNITY)
Admission: RE | Admit: 2017-12-24 | Discharge: 2017-12-24 | Disposition: A | Discharge status: Home / Self Care | Payer: BLUE CROSS/BLUE SHIELD | Source: Ambulatory Visit | Attending: Cardiology | Admitting: Cardiology

## 2017-12-24 DIAGNOSIS — I5022 Chronic systolic (congestive) heart failure: Secondary | ICD-10-CM | POA: Diagnosis present

## 2017-12-24 LAB — BASIC METABOLIC PANEL
Anion gap: 8 (ref 5–15)
BUN: 11 mg/dL (ref 6–20)
CO2: 27 mmol/L (ref 22–32)
Calcium: 9.3 mg/dL (ref 8.9–10.3)
Chloride: 105 mmol/L (ref 101–111)
Creatinine, Ser: 0.96 mg/dL (ref 0.44–1.00)
GFR calc Af Amer: >60 mL/min (ref >60)
GFR calc non Af Amer: >60 mL/min (ref >60)
Glucose, Bld: 134 mg/dL — ABNORMAL HIGH (ref 65–99)
Potassium: 4.7 mmol/L (ref 3.5–5.1)
Sodium: 140 mmol/L (ref 135–145)

## 2017-12-25 ENCOUNTER — Other Ambulatory Visit (HOSPITAL_COMMUNITY): Payer: Self-pay | Admitting: *Deleted

## 2017-12-25 MED ORDER — POTASSIUM CHLORIDE CRYS ER 20 MEQ PO TBCR: 20.0000 meq | EXTENDED_RELEASE_TABLET | Freq: Every day | ORAL | 6 refills | Status: DC

## 2018-01-01 ENCOUNTER — Encounter (HOSPITAL_COMMUNITY): Payer: Self-pay | Admitting: *Deleted

## 2018-01-01 NOTE — Progress Notes (Signed)
Received request from [employer] regarding a voluntary benefits claim filed on behalf of patient.  Form completed and faxed today to [phone].  Original form will be scanned to patient's electronic medical record.

## 2018-01-12 ENCOUNTER — Other Ambulatory Visit (HOSPITAL_COMMUNITY): Payer: Self-pay | Admitting: Internal Medicine

## 2018-01-13 ENCOUNTER — Other Ambulatory Visit (HOSPITAL_COMMUNITY): Payer: Self-pay | Admitting: Internal Medicine

## 2018-01-13 DIAGNOSIS — I160 Hypertensive urgency: Secondary | ICD-10-CM

## 2018-01-13 DIAGNOSIS — I5041 Acute combined systolic (congestive) and diastolic (congestive) heart failure: Secondary | ICD-10-CM

## 2018-02-04 ENCOUNTER — Other Ambulatory Visit: Payer: Self-pay | Admitting: Nurse Practitioner

## 2018-02-04 DIAGNOSIS — Z1231 Encounter for screening mammogram for malignant neoplasm of breast: Secondary | ICD-10-CM

## 2018-02-25 ENCOUNTER — Ambulatory Visit
Admission: RE | Admit: 2018-02-25 | Discharge: 2018-02-25 | Disposition: A | Discharge status: Home / Self Care | Payer: BLUE CROSS/BLUE SHIELD | Source: Ambulatory Visit | Attending: Nurse Practitioner | Admitting: Nurse Practitioner

## 2018-02-25 DIAGNOSIS — Z1231 Encounter for screening mammogram for malignant neoplasm of breast: Secondary | ICD-10-CM

## 2018-03-25 ENCOUNTER — Telehealth (HOSPITAL_COMMUNITY): Payer: Self-pay | Admitting: Cardiology

## 2018-03-25 MED ORDER — ISOSORB DINITRATE-HYDRALAZINE 20-37.5 MG PO TABS: 2.0000 | ORAL_TABLET | Freq: Two times a day (BID) | ORAL | 3 refills | Status: DC

## 2018-03-25 NOTE — Telephone Encounter (Signed)
Patient called to report she cannot afford bidil, was given savings card today and she reports this program was shut down in 2007  advised next step would be use a pharmacy contracted with [facility] like [facility], [facility] or [facility] which should have better copays (usually $45/mo)  rx sent advised will follow up with CHF pharmacist to make sure we are not missing any steps

## 2018-03-26 NOTE — Telephone Encounter (Signed)
Called [facility] who verified $20 copay. Called and left VM for Ms. Beverely Pace.   Tyler Deis. Bonnye Fava, PharmD, BCPS, CPP Clinical Pharmacist Phone: [phone] 03/26/2018 10:41 AM

## 2018-04-17 ENCOUNTER — Other Ambulatory Visit (HOSPITAL_COMMUNITY): Payer: Self-pay | Admitting: Internal Medicine

## 2018-04-17 DIAGNOSIS — I5041 Acute combined systolic (congestive) and diastolic (congestive) heart failure: Secondary | ICD-10-CM

## 2018-04-17 DIAGNOSIS — I160 Hypertensive urgency: Secondary | ICD-10-CM

## 2018-07-29 ENCOUNTER — Other Ambulatory Visit (HOSPITAL_COMMUNITY): Payer: Self-pay | Admitting: Cardiology

## 2018-08-05 ENCOUNTER — Other Ambulatory Visit (HOSPITAL_COMMUNITY): Payer: Self-pay | Admitting: Internal Medicine

## 2018-08-06 ENCOUNTER — Other Ambulatory Visit (HOSPITAL_COMMUNITY): Payer: Self-pay

## 2018-08-06 MED ORDER — CARVEDILOL 3.125 MG PO TABS: 3.1250 mg | ORAL_TABLET | Freq: Two times a day (BID) | ORAL | 0 refills | Status: DC

## 2018-08-07 ENCOUNTER — Encounter (HOSPITAL_COMMUNITY): Payer: Self-pay

## 2018-08-07 ENCOUNTER — Ambulatory Visit (HOSPITAL_COMMUNITY)
Admission: RE | Admit: 2018-08-07 | Discharge: 2018-08-07 | Disposition: A | Discharge status: Home / Self Care | Payer: BLUE CROSS/BLUE SHIELD | Source: Ambulatory Visit | Attending: Internal Medicine | Admitting: Internal Medicine

## 2018-08-07 ENCOUNTER — Encounter (HOSPITAL_COMMUNITY): Payer: Self-pay | Admitting: Internal Medicine

## 2018-08-07 VITALS — BP 142/70 | HR 88 | Wt 159.0 lb

## 2018-08-07 DIAGNOSIS — I428 Other cardiomyopathies: Secondary | ICD-10-CM | POA: Diagnosis not present

## 2018-08-07 DIAGNOSIS — I10 Essential (primary) hypertension: Secondary | ICD-10-CM | POA: Diagnosis not present

## 2018-08-07 DIAGNOSIS — I5042 Chronic combined systolic (congestive) and diastolic (congestive) heart failure: Secondary | ICD-10-CM | POA: Insufficient documentation

## 2018-08-07 DIAGNOSIS — I110 Hypertensive heart disease with heart failure: Secondary | ICD-10-CM | POA: Insufficient documentation

## 2018-08-07 DIAGNOSIS — Z8249 Family history of ischemic heart disease and other diseases of the circulatory system: Secondary | ICD-10-CM | POA: Diagnosis not present

## 2018-08-07 DIAGNOSIS — I2510 Atherosclerotic heart disease of native coronary artery without angina pectoris: Secondary | ICD-10-CM | POA: Insufficient documentation

## 2018-08-07 DIAGNOSIS — Z7982 Long term (current) use of aspirin: Secondary | ICD-10-CM | POA: Insufficient documentation

## 2018-08-07 DIAGNOSIS — I5022 Chronic systolic (congestive) heart failure: Secondary | ICD-10-CM | POA: Diagnosis not present

## 2018-08-07 DIAGNOSIS — Z79899 Other long term (current) drug therapy: Secondary | ICD-10-CM | POA: Insufficient documentation

## 2018-08-07 DIAGNOSIS — R0683 Snoring: Secondary | ICD-10-CM | POA: Diagnosis not present

## 2018-08-07 LAB — BASIC METABOLIC PANEL
Anion gap: 9 (ref 5–15)
BUN: 22 mg/dL — ABNORMAL HIGH (ref 6–20)
CO2: 23 mmol/L (ref 22–32)
Calcium: 9.8 mg/dL (ref 8.9–10.3)
Chloride: 107 mmol/L (ref 98–111)
Creatinine, Ser: 1.15 mg/dL — ABNORMAL HIGH (ref 0.44–1.00)
GFR calc Af Amer: >60 mL/min (ref >60)
GFR calc non Af Amer: 52 mL/min — ABNORMAL LOW (ref >60)
Glucose, Bld: 132 mg/dL — ABNORMAL HIGH (ref 70–99)
Potassium: 4.6 mmol/L (ref 3.5–5.1)
Sodium: 139 mmol/L (ref 135–145)

## 2018-08-07 MED ORDER — CARVEDILOL 6.25 MG PO TABS: 3.1250 mg | ORAL_TABLET | Freq: Two times a day (BID) | ORAL | 6 refills | Status: DC

## 2018-08-07 NOTE — Progress Notes (Signed)
ADVANCED HF CLINIC NOTE   Primary Cardiologist: Anzel Kearse  HPI:  Christine Wilson is a 58 year old female with history of hypertension, chronic combined systolic and diastolic CHF (EF 34-28%) in Nov. 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 Last echo 11/18 EF 55-60%   Admitted in Nov. 2017 with two-week of DOE. In ER, was found to have a blood pressure of 205/120. CT angiogram of the chest was performed which was negative for pulmonary embolism but show some interstitial edema with bilateral pleural effusions. Patient was admitted with acute CHF and uncontrolled hypertension patient was started on IV Lasix. Echo was done which show 15-20% of ejection fraction grade 1 diastolic dysfunction.   Right heart and left heart catheterization 11/17 was done showed moderate nonobstructive CAD with 60% LAD lesion with borderline CO.  Cardiac MRI showed EF of 22% diffuse hypokinesis normal right ventricular function, but no evidence of prior MIs, myocarditis or infiltrative disease. She was diuresed and started on Entresto, spironolactone and digoxin.   She returns today for HF follow up.  Doing very well. Can do all activities without limitation. No edema, orthopnea, PND. SBP 125-140s. Mostly 135. Ran out of carvedilol 4 days ago. Daughter said she snores a lot   Echo 06/02/2016 EF 15-20%.  Echo 10/16/16 EF EF 55-60% (reviewed personally)    R/L heart cath 06/04/16 Findings:  Ao = 141/85 (105) LV =  141/11 RA = 4 RV = 29/1 PA = 34/12 (21) PCW = 8 Fick cardiac output/index = 2.6/1.6 Thermo CO/CI = 2.9/1.8 PVR = 5.0 WU SVR = 3087 FA sat = 95% PA sat = 42%, 44%  Assessment: 1. 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 Markedly depressed CO with severely elevated SVR 4. Low filling pressures     Past Medical History:  Diagnosis Date  . CHF (congestive heart failure) (HCC) 05/31/2016   cardiologist at [facility]  . Hypertension      Current Outpatient Medications  Medication Sig Dispense Refill  . amLODipine (NORVASC) 10 MG tablet Take 1 tablet (10 mg total) by mouth daily. 30 tablet 11  . aspirin 81 MG EC tablet Take 1 tablet (81 mg total) by mouth daily. 30 tablet 11  . atorvastatin (LIPITOR) 40 MG tablet Take 1 tablet (40 mg total) by mouth daily at 6 PM. 30 tablet 11  . BIDIL 20-37.5 MG tablet TAKE (2) TABLETS TWICE DAILY. 120 tablet 0  . BIOTIN FORTE PO Take 1 tablet by mouth daily as needed (takes occassionaly).     . carvedilol (COREG) 3.125 MG tablet Take 1 tablet (3.125 mg total) by mouth 2 (two) times daily with a meal. 60 tablet 0  . ENTRESTO 97-103 MG TAKE 1 TABLET BY MOUTH TWICE DAILY 60 tablet 5  . Multiple Vitamins-Minerals (MULTIVITAMIN WITH MINERALS) tablet Take 1 tablet by mouth daily.    Marland Kitchen spironolactone (ALDACTONE) 25 MG tablet Take 1 tablet (25 mg total) by mouth daily. 30 tablet 11  . potassium chloride SA (K-DUR,KLOR-CON) 20 MEQ tablet Take 1 tablet (20 mEq total) by mouth daily. (Patient not taking: Reported on 08/07/2018) 30 tablet 6  . vitamin E 100 UNIT capsule Take by mouth daily.     No current facility-administered medications for this encounter.     No Known Allergies    Social History   Socioeconomic History  . Marital status: Divorced    Spouse name: Not on file  . Number of children: Not  on file  . Years of education: Not on file  . Highest education level: Not on file  Occupational History  . Not on file  Social Needs  . Financial resource strain: Not on file  . Food insecurity:    Worry: Not on file    Inability: Not on file  . Transportation needs:    Medical: Not on file    Non-medical: Not on file  Tobacco Use  . Smoking status: Never Smoker  . Smokeless tobacco: Never Used  Substance and Sexual Activity  . Alcohol use: No  . Drug use: No  . Sexual activity: Not on file  Lifestyle  . Physical activity:    Days per week: Not on file    Minutes per  session: Not on file  . Stress: Not on file  Relationships  . Social connections:    Talks on phone: Not on file    Gets together: Not on file    Attends religious service: Not on file    Active member of club or organization: Not on file    Attends meetings of clubs or organizations: Not on file    Relationship status: Not on file  . Intimate partner violence:    Fear of current or ex partner: Not on file    Emotionally abused: Not on file    Physically abused: Not on file    Forced sexual activity: Not on file  Other Topics Concern  . Not on file  Social History Narrative  . Not on file      Family History  Problem Relation Age of Onset  . Diabetes Mother   . Hypertension Mother   . Heart disease Mother 1465       a. Passed away due to MI  . Breast cancer Mother   . Heart disease Sister   . Diabetes Brother   . Cancer Maternal Grandmother   . Hypertension Maternal Grandmother   . Breast cancer Maternal Grandmother   . Breast cancer Maternal Aunt   . Cancer Paternal Aunt   . Colon cancer Neg Hx     Vitals:   08/07/18 0942  BP: (!) 142/70  Pulse: 88  SpO2: 98%  Weight: 72.1 kg (159 lb)    PHYSICAL EXAM: General:  Well appearing. No resp difficulty HEENT: normal Neck: supple. no JVD. Carotids 2+ bilat; no bruits. No lymphadenopathy or thryomegaly appreciated. Cor: PMI nondisplaced. Regular rate & rhythm. No rubs, gallops or murmurs. Lungs: clear Abdomen: soft, nontender, nondistended. No hepatosplenomegaly. No bruits or masses. Good bowel sounds. Extremities: no cyanosis, clubbing, rash, edema Neuro: alert & orientedx3, cranial nerves grossly intact. moves all 4 extremities w/o difficulty. Affect pleasant  ASSESSMENT & PLAN: 1. Chronic systolic HF: Onset 05/2016, EF 15-20%, cMRI 22%. Cath 05/2016 with no obstructive CAD. NICM related to HTN.  - Echo 3/18 EF 55-65% - Stable NYHA I - Volume status looks good  - Continue Entresto 97/103 mg BID - Continue Spiro  25 mg daily. - Continue Bidil 2 tabs TID.  - Increase Coreg 6.25 mg BID - Repeat echo to ensure EF stability  - Check BMET  2. HTN:  - BP overall much improved but still elevated at times  - Continue amlodipine 10mg  daily.  - Increase carvedilol to 6.25 bid - Will refer for sleep study   3. Nonobstructive CAD: distal LAD with 60% stenosis - No s/s of ischemia - Continue ASA, statin and Coreg.   4. Snoring - send  for PSG  Can graduate to [facility].   Arvilla Meres, MD  9:55 AM

## 2018-08-07 NOTE — Patient Instructions (Signed)
Labs done today  Your physician has requested that you have an echocardiogram. Echocardiography is a painless test that uses sound waves to create images of your heart. It provides your doctor with information about the size and shape of your heart and how well your heart's chambers and valves are working. This procedure takes approximately one hour. There are no restrictions for this procedure.  Your physician has recommended that you have a sleep study. This test records several body functions during sleep, including: brain activity, eye movement, oxygen and carbon dioxide blood levels, heart rate and rhythm, breathing rate and rhythm, the flow of air through your mouth and nose, snoring, body muscle movements, and chest and belly movement.  You have been referred to Chilton Si, MD at [facility]. That office will contact you in order to schedule an appointment.  INCREASE Carvedilol 6.25mg  (1 tab) twice daily  Follow up with Dr. Gala Romney as needed.

## 2018-08-07 NOTE — Addendum Note (Signed)
Encounter addended by: Nicole Cella, RN on: 08/07/2018 10:18 AM  Actions taken: Visit diagnoses modified, Diagnosis association updated, Order list changed, Charge Capture section accepted, Clinical Note Signed

## 2018-08-08 ENCOUNTER — Encounter: Payer: Self-pay | Admitting: Internal Medicine

## 2018-08-17 ENCOUNTER — Other Ambulatory Visit (HOSPITAL_COMMUNITY): Payer: Self-pay | Admitting: Internal Medicine

## 2018-08-17 DIAGNOSIS — I5041 Acute combined systolic (congestive) and diastolic (congestive) heart failure: Secondary | ICD-10-CM

## 2018-08-17 DIAGNOSIS — I160 Hypertensive urgency: Secondary | ICD-10-CM

## 2018-08-19 ENCOUNTER — Other Ambulatory Visit (HOSPITAL_COMMUNITY): Payer: Self-pay

## 2018-08-19 DIAGNOSIS — I5041 Acute combined systolic (congestive) and diastolic (congestive) heart failure: Secondary | ICD-10-CM

## 2018-08-19 DIAGNOSIS — I160 Hypertensive urgency: Secondary | ICD-10-CM

## 2018-08-19 MED ORDER — SPIRONOLACTONE 25 MG PO TABS: 25.0000 mg | ORAL_TABLET | Freq: Every day | ORAL | 6 refills | Status: DC

## 2018-08-27 ENCOUNTER — Ambulatory Visit (HOSPITAL_COMMUNITY): Payer: BLUE CROSS/BLUE SHIELD | Attending: Cardiovascular Disease

## 2018-08-27 DIAGNOSIS — I5022 Chronic systolic (congestive) heart failure: Secondary | ICD-10-CM | POA: Diagnosis not present

## 2018-08-27 MED ORDER — PERFLUTREN LIPID MICROSPHERE
1.0000 mL | INTRAVENOUS | Status: AC | PRN
  Administered 2018-08-27: 2 mL via INTRAVENOUS

## 2018-08-31 ENCOUNTER — Other Ambulatory Visit (HOSPITAL_COMMUNITY): Payer: Self-pay | Admitting: Cardiology

## 2018-09-01 ENCOUNTER — Other Ambulatory Visit (HOSPITAL_COMMUNITY): Payer: Self-pay | Admitting: Internal Medicine

## 2018-09-01 NOTE — Telephone Encounter (Signed)
Future refills should be sent to current cardiologist, Chilton Si, MD

## 2018-09-02 ENCOUNTER — Other Ambulatory Visit (HOSPITAL_COMMUNITY): Payer: Self-pay | Admitting: Cardiology

## 2018-09-02 ENCOUNTER — Other Ambulatory Visit (HOSPITAL_COMMUNITY): Payer: Self-pay | Admitting: Internal Medicine

## 2018-09-02 DIAGNOSIS — I160 Hypertensive urgency: Secondary | ICD-10-CM

## 2018-09-02 DIAGNOSIS — I5041 Acute combined systolic (congestive) and diastolic (congestive) heart failure: Secondary | ICD-10-CM

## 2018-09-27 ENCOUNTER — Other Ambulatory Visit (HOSPITAL_COMMUNITY): Payer: Self-pay | Admitting: Internal Medicine

## 2018-10-07 ENCOUNTER — Other Ambulatory Visit (HOSPITAL_COMMUNITY): Payer: Self-pay | Admitting: Cardiology

## 2018-10-14 ENCOUNTER — Other Ambulatory Visit (HOSPITAL_COMMUNITY): Payer: Self-pay | Admitting: Internal Medicine

## 2018-10-14 DIAGNOSIS — I5041 Acute combined systolic (congestive) and diastolic (congestive) heart failure: Secondary | ICD-10-CM

## 2018-10-14 DIAGNOSIS — I160 Hypertensive urgency: Secondary | ICD-10-CM

## 2018-10-15 ENCOUNTER — Ambulatory Visit: Payer: BLUE CROSS/BLUE SHIELD | Admitting: Cardiovascular Disease

## 2018-10-18 ENCOUNTER — Other Ambulatory Visit (HOSPITAL_COMMUNITY): Payer: Self-pay | Admitting: Internal Medicine

## 2018-10-20 ENCOUNTER — Telehealth (HOSPITAL_COMMUNITY): Payer: Self-pay

## 2018-10-20 MED ORDER — CARVEDILOL 6.25 MG PO TABS: 6.2500 mg | ORAL_TABLET | Freq: Two times a day (BID) | ORAL | 1 refills | Status: DC

## 2018-10-20 NOTE — Telephone Encounter (Signed)
Received refill request from pharmacy for Coreg 3.125mg  BID.  When chart reviewed, pt dose was increased to 6.25mg  in January by MD.  Script at the time was sent to pharmacy for 6.25mg  (1/2 tab) BID.  Confirmed with patient, she was taken 6.25mg  (1/2 tab) as directed by script.  Pt was taken same dose prior to last visit.  Advised patient new script sent to pharmacy with dose advised by MD at last appointment in January.

## 2018-12-13 ENCOUNTER — Other Ambulatory Visit (HOSPITAL_COMMUNITY): Payer: Self-pay | Admitting: Internal Medicine

## 2019-01-11 ENCOUNTER — Other Ambulatory Visit (HOSPITAL_COMMUNITY): Payer: Self-pay | Admitting: Internal Medicine

## 2019-04-14 ENCOUNTER — Other Ambulatory Visit (HOSPITAL_COMMUNITY): Payer: Self-pay | Admitting: Internal Medicine

## 2019-04-14 DIAGNOSIS — I160 Hypertensive urgency: Secondary | ICD-10-CM

## 2019-04-14 DIAGNOSIS — I5041 Acute combined systolic (congestive) and diastolic (congestive) heart failure: Secondary | ICD-10-CM

## 2019-04-21 ENCOUNTER — Other Ambulatory Visit (HOSPITAL_COMMUNITY): Payer: Self-pay

## 2019-04-21 DIAGNOSIS — I5041 Acute combined systolic (congestive) and diastolic (congestive) heart failure: Secondary | ICD-10-CM

## 2019-04-21 DIAGNOSIS — I160 Hypertensive urgency: Secondary | ICD-10-CM

## 2019-04-21 MED ORDER — SPIRONOLACTONE 25 MG PO TABS: 25.0000 mg | ORAL_TABLET | Freq: Every day | ORAL | 6 refills | Status: DC

## 2019-04-21 MED ORDER — CARVEDILOL 6.25 MG PO TABS: 6.2500 mg | ORAL_TABLET | Freq: Two times a day (BID) | ORAL | 1 refills | Status: DC

## 2019-04-27 ENCOUNTER — Other Ambulatory Visit (HOSPITAL_COMMUNITY): Payer: Self-pay

## 2019-04-27 DIAGNOSIS — I5041 Acute combined systolic (congestive) and diastolic (congestive) heart failure: Secondary | ICD-10-CM

## 2019-04-27 DIAGNOSIS — I160 Hypertensive urgency: Secondary | ICD-10-CM

## 2019-04-27 MED ORDER — SPIRONOLACTONE 25 MG PO TABS: 25.0000 mg | ORAL_TABLET | Freq: Every day | ORAL | 6 refills | Status: DC

## 2019-05-13 ENCOUNTER — Other Ambulatory Visit (HOSPITAL_COMMUNITY): Payer: Self-pay | Admitting: *Deleted

## 2019-05-13 ENCOUNTER — Other Ambulatory Visit: Payer: Self-pay | Admitting: Nurse Practitioner

## 2019-05-13 DIAGNOSIS — I5041 Acute combined systolic (congestive) and diastolic (congestive) heart failure: Secondary | ICD-10-CM

## 2019-05-13 DIAGNOSIS — I160 Hypertensive urgency: Secondary | ICD-10-CM

## 2019-05-13 MED ORDER — BIDIL 20-37.5 MG PO TABS: 2.0000 | ORAL_TABLET | Freq: Three times a day (TID) | ORAL | 11 refills | Status: DC

## 2019-05-13 MED ORDER — CARVEDILOL 6.25 MG PO TABS: 6.2500 mg | ORAL_TABLET | Freq: Two times a day (BID) | ORAL | 1 refills | Status: DC

## 2019-05-13 MED ORDER — ENTRESTO 97-103 MG PO TABS: 1.0000 | ORAL_TABLET | Freq: Two times a day (BID) | ORAL | 3 refills | Status: DC

## 2019-05-19 ENCOUNTER — Other Ambulatory Visit: Payer: Self-pay

## 2019-05-19 ENCOUNTER — Ambulatory Visit: Payer: BLUE CROSS/BLUE SHIELD | Admitting: Cardiovascular Disease

## 2019-05-19 ENCOUNTER — Encounter: Payer: Self-pay | Admitting: Cardiovascular Disease

## 2019-05-19 VITALS — BP 138/64 | HR 78 | Ht 61.0 in | Wt 154.9 lb

## 2019-05-19 DIAGNOSIS — I10 Essential (primary) hypertension: Secondary | ICD-10-CM | POA: Diagnosis not present

## 2019-05-19 DIAGNOSIS — Z5181 Encounter for therapeutic drug level monitoring: Secondary | ICD-10-CM | POA: Diagnosis not present

## 2019-05-19 DIAGNOSIS — I160 Hypertensive urgency: Secondary | ICD-10-CM | POA: Diagnosis not present

## 2019-05-19 DIAGNOSIS — I5041 Acute combined systolic (congestive) and diastolic (congestive) heart failure: Secondary | ICD-10-CM | POA: Diagnosis not present

## 2019-05-19 MED ORDER — ATORVASTATIN CALCIUM 40 MG PO TABS: ORAL_TABLET | ORAL | 3 refills | Status: DC

## 2019-05-19 MED ORDER — SPIRONOLACTONE 25 MG PO TABS: 25.0000 mg | ORAL_TABLET | Freq: Every day | ORAL | 3 refills | Status: DC

## 2019-05-19 NOTE — Progress Notes (Signed)
Cardiology Office Note   Date:  05/19/2019   ID:  Christine Wilson, DOB 1961/03/10, MRN [id number]  PCP:  Anne Ng, NP  Cardiologist:   Chilton Si, MD   No chief complaint on file.    History of Present Illness: Christine Wilson is a 58 y.o. female chronic diastolic heart failure, hypertension, nonobstructive CAD who presents to establish care.  She was previously a patient of Dr. Gala Romney.  She developed acute systolic and diastolic heart failure (LVEF 15 to 20%) in November 2017.  This was thought to be due to hypertensive cardiomyopathy.  Her blood pressure was 205/120.  Chest CT-a was negative for pulmonary embolism.  She had a right and left heart catheterization at that time that revealed moderate, nonobstructive CAD with 60% LAD lesion.  Cardiac MRI revealed diffuse hypokinesis with LVEF 22% and normal RV function.  There is no delayed enhancement.  She was started on Entresto, spironolactone and digoxin.  Over time to following up with the heart failure clinic her ejection fraction has normalized.  Most recently she had an echo 08/2018 that revealed LVEF 60 to 65% with grade 1 diastolic dysfunction.  RV function was normal.  She last saw Dr. Teena Dunk on 07/2018 and her blood pressure was poorly controlled so carvedilol was increased.  She went to her dentist last week and her BP was 180/96.  Lately she hasn't been checking her BP as much.  She notes that she has been out of her spironolactone for at least a month.  She is also been out of atorvastatin.  The prescription bottle was last filled in March for 90-day supply.  She notes that she misses her medications no more than twice per week.  Since then she has been checking it more and it has been  Ranging 130-160/60-80  HR 70-90.  She has been stressed lately after a family member and friend passed.  She mostly cooks at home and tries to limit salt intake.  She tries to walk for exercise about once per day for 10 minutes.  She  has no exertional chest pain or shortness of breath.  She does notice that she sometimes has discomfort in her left arm.  This tends to occur at rest or if she is lifting heavy items.  Recently she moved and thinks that she may have strained her arm.  Has not noticed any lower extremity edema, orthopnea, or PND.   Past Medical History:  Diagnosis Date  . CHF (congestive heart failure) (HCC) 05/31/2016   [facility]ologist [facility]  . Hypertension     Past Surgical History:  Procedure Laterality Date  . CARDIAC CATHETERIZATION N/A 06/04/2016   Procedure: Right/Left Heart Cath and Coronary Angiography;  Surgeon: Dolores Patty, MD;  Location: [facility] INVASIVE CV LAB;  Service: Cardiovascular;  Laterality: N/A;  . CESAREAN SECTION       Current Outpatient Medications  Medication Sig Dispense Refill  . amLODipine (NORVASC) 10 MG tablet TAKE 1 TABLET BY MOUTH EVERY DAY 30 tablet 11  . Ascorbic Acid (VITAMIN C) 1000 MG tablet Take 1,000 mg by mouth daily.    Marland Kitchen aspirin 81 MG EC tablet Take 1 tablet (81 mg total) by mouth daily. 30 tablet 11  . atorvastatin (LIPITOR) 40 MG tablet TAKE 1 TABLET BY MOUTH ONCE DAILY AT  6  PM 90 tablet 3  . BIOTIN FORTE PO Take 1 tablet by mouth daily as needed (takes occassionaly).     . carvedilol (  COREG) 6.25 MG tablet Take 1 tablet (6.25 mg total) by mouth 2 (two) times daily with a meal. 180 tablet 1  . isosorbide-hydrALAZINE (BIDIL) 20-37.5 MG tablet Take 2 tablets by mouth 3 (three) times daily. 180 tablet 11  . Multiple Vitamins-Minerals (MULTIVITAMIN WITH MINERALS) tablet Take 1 tablet by mouth daily.    . sacubitril-valsartan (ENTRESTO) 97-103 MG Take 1 tablet by mouth 2 (two) times daily. 180 tablet 3  . spironolactone (ALDACTONE) 25 MG tablet Take 1 tablet (25 mg total) by mouth daily. 90 tablet 3   No current facility-administered medications for this visit.     Allergies:   Patient has no known allergies.    Social History:  The patient  reports that  she has never smoked. She has never used smokeless tobacco. She reports that she does not drink alcohol or use drugs.   Family History:  The patient's family history includes Breast cancer in her maternal aunt, maternal grandmother, and mother; Cancer in her maternal grandmother and paternal aunt; Diabetes in her brother and mother; Heart disease in her sister; Heart disease (age of onset: 34) in her mother; Hypertension in her maternal grandmother and mother.    ROS:  Please see the history of present illness.   Otherwise, review of systems are positive for none.   All other systems are reviewed and negative.    PHYSICAL EXAM: VS:  BP 138/64   Pulse 78   Ht 5\' 1"  (1.549 m)   Wt 154 lb 14.4 oz (70.3 kg)   BMI 29.27 kg/m  , BMI Body mass index is 29.27 kg/m. GENERAL:  Well appearing HEENT:  Pupils equal round and reactive, fundi not visualized, oral mucosa unremarkable NECK:  No jugular venous distention, waveform within normal limits, carotid upstroke brisk and symmetric, no bruits, no thyromegaly LYMPHATICS:  No cervical adenopathy LUNGS:  Clear to auscultation bilaterally HEART:  RRR.  PMI not displaced or sustained,S1 and S2 within normal limits, no S3, no S4, no clicks, no rubs, no murmurs ABD:  Flat, positive bowel sounds normal in frequency in pitch, no bruits, no rebound, no guarding, no midline pulsatile mass, no hepatomegaly, no splenomegaly EXT:  2 plus pulses throughout, no edema, no cyanosis no clubbing SKIN:  No rashes no nodules NEURO:  Cranial nerves II through XII grossly intact, motor grossly intact throughout PSYCH:  Cognitively intact, oriented to person place and time    EKG:  EKG is ordered today. The ekg ordered today demonstrates sinus rhythm.  Rate 70 bpm.  Echo 08/27/18: IMPRESSIONS   1. The left ventricle has normal systolic function of 60-65%. The cavity size is normal. There is no increased left ventricular wall thickness. Echo evidence of impaired  diastolic relaxation indeterminate filling pressure.  2. The right ventricle has normal systolic function. The cavity in normal in size. There is no increase in right ventricular wall thickness. Right ventricular systolic pressure is mildly elevated with an estimated pressure of 35.7 mmHg.  3. The mitral valve is normal in structure No evidence of mitral valve stenosis.  4. The aortic valve is tricuspid.  LHC/RHC 06/04/16: Findings:  Ao = 141/85 (105) LV =  141/11 RA = 4 RV = 29/1 PA = 34/12 (21) PCW = 8 Fick cardiac output/index = 2.6/1.6 Thermo CO/CI = 2.9/1.8 PVR = 5.0 WU SVR = 3087 FA sat = 95% PA sat = 42%, 44%  Assessment: 1. 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 Severe NICM EF ~25% suspect  due to hypertensive CM 3. Markedly depressed CO with severely elevated SVR 4. Low filling pressures   Recent Labs: 08/07/2018: BUN 22; Creatinine, Ser 1.15; Potassium 4.6; Sodium 139    Lipid Panel    Component Value Date/Time   CHOL 173 05/31/2016 1726   TRIG 181 (H) 05/31/2016 1726   HDL 54 05/31/2016 1726   CHOLHDL 3.2 05/31/2016 1726   VLDL 36 (H) 05/31/2016 1726   LDLCALC 83 05/31/2016 1726      Wt Readings from Last 3 Encounters:  05/19/19 154 lb 14.4 oz (70.3 kg)  08/07/18 159 lb (72.1 kg)  12/09/17 159 lb (72.1 kg)      ASSESSMENT AND PLAN:  # Essential hypertension: We will not make any adjustments to her medications at this time.  She has been out of spironolactone for over a month.  It was last filled in August 2020.  We will refill this medication.  She will track her blood pressure at home and bring to follow-up with our pharmacist  # Chronic diastolic heart failure: LVEF was previously reduced but has normalized.  She has no heart failure symptoms.  Her systolic dysfunction was due to hypertension.  We will aggressively treat her hypertension as above.  Continue amlodipine, BiDil, carvedilol, Entresto and spironolactone  # Pure  hypercholesterolemia She has been out of her statin for several months.  We discussed how this is related to his coronary disease.  Recheck lipids and a CMP in 2 to 3 months  # Moderate CAD: 60% LAD stenosis noted on cath in 2017.  Continue aspirin and restart atorvastatin as above.  Continue carvedilol.   Current medicines are reviewed at length with the patient today.  The patient does not have concerns regarding medicines.  The following changes have been made:  no change  Labs/ tests ordered today include:   Orders Placed This Encounter  Procedures  . Lipid panel  . Comprehensive metabolic panel  . EKG 12-Lead     Disposition:   FU with pharmacist in 4 to 6 weeks for hypertension.  Follow-up with me in 6 months.    Signed, Geremiah Fussell C. Oval Linsey, MD, [facility]  05/19/2019 5:36 PM    [place]

## 2019-05-19 NOTE — Patient Instructions (Signed)
Medication Instructions:  RESUME YOUR SPIRONOLACTONE AND ATORVASTATIN RX HAS BEEN SENT TO PHARMACY   *If you need a refill on your cardiac medications before your next appointment, please call your pharmacy*  Lab Work: FASTING LP/CMET [place] If you have labs (blood work) drawn today and your tests are completely normal, you will receive your results only by: Marland Kitchen MyChart Message (if you have MyChart) OR . A paper copy in the mail If you have any lab test that is abnormal or we need to change your treatment, we will call you to review the results.  Testing/Procedures: NONE  Follow-Up: At [facility], you and your health needs are our priority.  As part of our continuing mission to provide you with exceptional heart care, we have created designated Provider Care Teams.  These Care Teams include your primary Cardiologist (physician) and Advanced Practice Providers (APPs -  Physician Assistants and Nurse Practitioners) who all work together to provide you with the care you need, when you need it.  Your next appointment:    Your physician recommends that you schedule a follow-up appointment in: 4-6 [place]  6 months You will receive a reminder letter in the mail two months in advance. If you don't receive a letter, please call our office to schedule the follow-up appointment.   The format for your next appointment:   Either In Person or Virtual  Provider:   You may see DR [facility] or one of the following Advanced Practice Providers on your designated Care Team:    Kerin Ransom, PA-C  [place], [place]  Coletta Memos, [place]   Other Instructions MONITOR AND LOG YOUR BLOOD PRESSURE AT HOME BRING READINGS AND BLOOD PRESSURE MACHINE TO FOLLOW UP

## 2019-06-16 ENCOUNTER — Ambulatory Visit: Payer: BLUE CROSS/BLUE SHIELD

## 2019-06-25 ENCOUNTER — Other Ambulatory Visit: Payer: Self-pay

## 2019-06-25 ENCOUNTER — Ambulatory Visit (INDEPENDENT_AMBULATORY_CARE_PROVIDER_SITE_OTHER): Payer: BLUE CROSS/BLUE SHIELD | Admitting: Pharmacist

## 2019-06-25 DIAGNOSIS — I10 Essential (primary) hypertension: Secondary | ICD-10-CM | POA: Insufficient documentation

## 2019-06-25 NOTE — Progress Notes (Signed)
Patient ID: Christine Wilson                 DOB: [birth year]                      MRN: [id number]     HPI: Christine Wilson is a 58 y.o. female referred by Dr. Duke Salvia to HTN clinic. PMH includes HFrEF, hypertension, nonobstructive CAD, issues with medication compliance. Denies swelling, falls, chest pain, or short breath. Report occasion dizziness with positional changes but denies falls. Patient is taking all medication as prescribed since las OV with DR Duke Salvia  Current HTN meds:  Amlodipine 10mg  - in AM Carvedilol 6.25mg  twice daily (6am and 6pm) Entresto 97-103mg  ywice daily (6am and 6pm) Bidil  2 tablets three times daily (twice daily ONLY) Spironolactone 25mg   in AM  BP goal: <130/80  Family History: The patient's family history includes Breast cancer in her maternal aunt, maternal grandmother, and mother; Cancer in her maternal grandmother and paternal aunt; Diabetes in her brother and mother; Heart disease in her sister; Heart disease (age of onset: 38) in her mother; Hypertension in her maternal grandmother and mother.  Social History: reports that she has never smoked. She has never used smokeless tobacco. She reports that she does not drink alcohol or use drugs.   Diet: mostly home cooked meals , limits salt intake  Exercise: walks 10 minutes every day  Home BP readings:  Home BP cuff (ReliON) is accurate within less than 68mm of Hg from manual reading. 11 readings since resuming all medication Average BP reading = 125/67, HR range 75-96bpm  Wt Readings from Last 3 Encounters:  06/25/19 157 lb 3.2 oz (71.3 kg)  05/19/19 154 lb 14.4 oz (70.3 kg)  08/07/18 159 lb (72.1 kg)   BP Readings from Last 3 Encounters:  06/25/19 (!) 106/58  05/19/19 138/64  08/07/18 (!) 142/70   Pulse Readings from Last 3 Encounters:  06/25/19 88  05/19/19 78  08/07/18 88    Past Medical History:  Diagnosis Date  . CHF (congestive heart failure) (HCC) 05/31/2016   cardiologist [facility]  .  Hypertension     Current Outpatient Medications on File Prior to Visit  Medication Sig Dispense Refill  . amLODipine (NORVASC) 10 MG tablet TAKE 1 TABLET BY MOUTH EVERY DAY 30 tablet 11  . Ascorbic Acid (VITAMIN C) 1000 MG tablet Take 1,000 mg by mouth daily.    08/09/18 aspirin 81 MG EC tablet Take 1 tablet (81 mg total) by mouth daily. 30 tablet 11  . atorvastatin (LIPITOR) 40 MG tablet TAKE 1 TABLET BY MOUTH ONCE DAILY AT  6  PM 90 tablet 3  . BIOTIN FORTE PO Take 1 tablet by mouth daily as needed (takes occassionaly).     . carvedilol (COREG) 6.25 MG tablet Take 1 tablet (6.25 mg total) by mouth 2 (two) times daily with a meal. 180 tablet 1  . isosorbide-hydrALAZINE (BIDIL) 20-37.5 MG tablet Take 2 tablets by mouth 3 (three) times daily. 180 tablet 11  . Multiple Vitamins-Minerals (MULTIVITAMIN WITH MINERALS) tablet Take 1 tablet by mouth daily.    . sacubitril-valsartan (ENTRESTO) 97-103 MG Take 1 tablet by mouth 2 (two) times daily. 180 tablet 3  . spironolactone (ALDACTONE) 25 MG tablet Take 1 tablet (25 mg total) by mouth daily. 90 tablet 3   No current facility-administered medications on file prior to visit.     No Known Allergies  Blood pressure (!) 106/58, pulse  88, resp. rate 15, height 5\' 1"  (1.549 m), weight 157 lb 3.2 oz (71.3 kg), SpO2 97 %.  HBP (high blood pressure) Blood pressure back into desired goal range and stable for the last 2 weeks. Patient denies SOB, swelling or increase fatigue. Reports compliance with all medication but is taking BiDil twice daily instead of TID. Will continue all medication as prescribed, and continue to work on proper hydration, positive lifestyle modifications and compliance. Plan to follow up at HTN clinic as needed.   Corrinna Karapetyan Rodriguez-Guzman PharmD, BCPS, [place] [address] [place] [address] 06/25/2019 9:59 AM

## 2019-06-25 NOTE — Patient Instructions (Signed)
Return for a follow up appointment as needed  Check your blood pressure at home daily (if able) and keep record of the readings.  Take your BP meds as follows: *NO MEDICATION CHANGES*  Bring all of your meds, your BP cuff and your record of home blood pressures to your next appointment.  Exercise as you're able, try to walk approximately 30 minutes per day.  Keep salt intake to a minimum, especially watch canned and prepared boxed foods.  Eat more fresh fruits and vegetables and fewer canned items.  Avoid eating in fast food restaurants.    HOW TO TAKE YOUR BLOOD PRESSURE: . Rest 5 minutes before taking your blood pressure. .  Don't smoke or drink caffeinated beverages for at least 30 minutes before. . Take your blood pressure before (not after) you eat. . Sit comfortably with your back supported and both feet on the floor (don't cross your legs). . Elevate your arm to heart level on a table or a desk. . Use the proper sized cuff. It should fit smoothly and snugly around your bare upper arm. There should be enough room to slip a fingertip under the cuff. The bottom edge of the cuff should be 1 inch above the crease of the elbow. . Ideally, take 3 measurements at one sitting and record the average.    

## 2019-06-25 NOTE — Assessment & Plan Note (Signed)
Blood pressure back into desired goal range and stable for the last 2 weeks. Patient denies SOB, swelling or increase fatigue. Reports compliance with all medication but is taking BiDil twice daily instead of TID. Will continue all medication as prescribed, and continue to work on proper hydration, positive lifestyle modifications and compliance. Plan to follow up at HTN clinic as needed.

## 2019-08-17 ENCOUNTER — Other Ambulatory Visit (HOSPITAL_COMMUNITY): Payer: Self-pay | Admitting: Internal Medicine

## 2019-08-17 ENCOUNTER — Other Ambulatory Visit (HOSPITAL_COMMUNITY): Payer: Self-pay | Admitting: Cardiology

## 2019-08-17 ENCOUNTER — Telehealth (HOSPITAL_COMMUNITY): Payer: Self-pay | Admitting: Pharmacy Technician

## 2019-08-17 NOTE — Telephone Encounter (Signed)
Patient Advocate Encounter   Received notification from [employer] that prior authorization for Bidil is required.   PA submitted on [employer]promptpa[employer] Key  [id number] Status is pending   Will continue to follow.  Archer Asa, CPhT

## 2019-08-24 ENCOUNTER — Other Ambulatory Visit (HOSPITAL_COMMUNITY): Payer: Self-pay

## 2019-08-24 NOTE — Telephone Encounter (Signed)
Advanced Heart Failure Patient Advocate Encounter  Prior Authorization for Bidil has been approved.    Effective dates: 08/17/19 through 08/16/20  Patients co-pay is $673.56  Was able to obtain a co-pay card for patient. Her pharmacy confirmed that they have another co-pay card to file.  Patient Advocate Encounter   Received notification from [employer] that prior authorization for Sherryll Burger is required.   PA submitted on [employer] Key [id number] Status is pending   Will continue to follow.  Archer Asa, CPhT

## 2019-08-24 NOTE — Telephone Encounter (Signed)
Opened in Error.

## 2019-08-25 NOTE — Telephone Encounter (Signed)
Advanced Heart Failure Patient Advocate Encounter  Prior Authorization for Sherryll Burger has been approved.    Effective dates: 08/24/19 through 08/23/20  Patients co-pay is $10.00 (Spoke with pharmacy, patient already has co-pay card on file).  Archer Asa, CPhT

## 2019-10-23 LAB — LIPID PANEL
Chol/HDL Ratio: 2.5 ratio (ref 0.0–4.4)
Cholesterol, Total: 157 mg/dL (ref 100–199)
HDL: 63 mg/dL (ref >39)
LDL Chol Calc (NIH): 77 mg/dL (ref 0–99)
Triglycerides: 89 mg/dL (ref 0–149)
VLDL Cholesterol Cal: 17 mg/dL (ref 5–40)

## 2019-10-23 LAB — COMPREHENSIVE METABOLIC PANEL
ALT: 12 IU/L (ref 0–32)
AST: 13 IU/L (ref 0–40)
Albumin/Globulin Ratio: 1.5 (ref 1.2–2.2)
Albumin: 4.4 g/dL (ref 3.8–4.9)
Alkaline Phosphatase: 128 IU/L — ABNORMAL HIGH (ref 39–117)
BUN/Creatinine Ratio: 15 (ref 12–28)
BUN: 17 mg/dL (ref 8–27)
Bilirubin Total: 0.2 mg/dL (ref 0.0–1.2)
CO2: 23 mmol/L (ref 20–29)
Calcium: 9.5 mg/dL (ref 8.7–10.3)
Chloride: 104 mmol/L (ref 96–106)
Creatinine, Ser: 1.15 mg/dL — ABNORMAL HIGH (ref 0.57–1.00)
GFR calc Af Amer: 60 mL/min/{1.73_m2} (ref >59)
GFR calc non Af Amer: 52 mL/min/{1.73_m2} — ABNORMAL LOW (ref >59)
Globulin, Total: 3 g/dL (ref 1.5–4.5)
Glucose: 123 mg/dL — ABNORMAL HIGH (ref 65–99)
Potassium: 4.9 mmol/L (ref 3.5–5.2)
Sodium: 140 mmol/L (ref 134–144)
Total Protein: 7.4 g/dL (ref 6.0–8.5)

## 2019-10-26 ENCOUNTER — Telehealth: Payer: Self-pay | Admitting: Nurse Practitioner

## 2019-10-26 NOTE — Telephone Encounter (Signed)
Please contact and schedule F2F appt to discuss lab results from Dr. Duke Salvia

## 2019-10-26 NOTE — Telephone Encounter (Signed)
-----   Message from Chilton Si, MD sent at 10/26/2019  9:55 AM EDT ----- Glucose is elevated.  Discuss with PCP.  Cholesterol levels look good.  Kidney function, liver function, and electrolytes are stable.

## 2019-10-27 ENCOUNTER — Other Ambulatory Visit (HOSPITAL_COMMUNITY): Payer: Self-pay | Admitting: Cardiology

## 2019-10-27 NOTE — Telephone Encounter (Signed)
LVM for the pt to call back. Need to offer an appt with [facility].

## 2019-10-28 ENCOUNTER — Telehealth: Payer: Self-pay | Admitting: Nurse Practitioner

## 2019-10-28 NOTE — Telephone Encounter (Signed)
appt made with [facility]

## 2019-10-28 NOTE — Telephone Encounter (Signed)
This is Dr. Leonides Sake pt. Pt does not need to see Dr. Gala Romney, but as needed. Please address

## 2019-10-28 NOTE — Telephone Encounter (Signed)
Patient is returning the call. CB is [phone]

## 2019-11-03 ENCOUNTER — Other Ambulatory Visit: Payer: Self-pay

## 2019-11-04 ENCOUNTER — Encounter: Payer: Self-pay | Admitting: Nurse Practitioner

## 2019-11-04 ENCOUNTER — Ambulatory Visit (INDEPENDENT_AMBULATORY_CARE_PROVIDER_SITE_OTHER): Payer: 59 | Admitting: Nurse Practitioner

## 2019-11-04 ENCOUNTER — Other Ambulatory Visit (HOSPITAL_COMMUNITY)
Admission: RE | Admit: 2019-11-04 | Discharge: 2019-11-04 | Disposition: A | Discharge status: Home / Self Care | Payer: 59 | Source: Ambulatory Visit | Attending: Nurse Practitioner | Admitting: Nurse Practitioner

## 2019-11-04 VITALS — BP 120/80 | HR 77 | Temp 98.1°F | Ht 61.0 in | Wt 153.6 lb

## 2019-11-04 DIAGNOSIS — E119 Type 2 diabetes mellitus without complications: Secondary | ICD-10-CM | POA: Insufficient documentation

## 2019-11-04 DIAGNOSIS — D509 Iron deficiency anemia, unspecified: Secondary | ICD-10-CM

## 2019-11-04 DIAGNOSIS — E1165 Type 2 diabetes mellitus with hyperglycemia: Secondary | ICD-10-CM | POA: Diagnosis not present

## 2019-11-04 DIAGNOSIS — R748 Abnormal levels of other serum enzymes: Secondary | ICD-10-CM

## 2019-11-04 DIAGNOSIS — R739 Hyperglycemia, unspecified: Secondary | ICD-10-CM

## 2019-11-04 DIAGNOSIS — Z124 Encounter for screening for malignant neoplasm of cervix: Secondary | ICD-10-CM | POA: Diagnosis present

## 2019-11-04 HISTORY — DX: Abnormal levels of other serum enzymes: R74.8

## 2019-11-04 NOTE — Patient Instructions (Addendum)
Normal TSH, renal function, and PAP. CBC indicates anemia: this could be due to slow GI bleed or chronic kidney disease. Need to return to office for IFOB kit (return 1kit  per week x 3). Start ferrous sulfate 1tab daily with food. HgbA1c of 6.8 which indicates diabetes. Start metformin 244m BID with food.  Diabetes Mellitus and Nutrition, Adult When you have diabetes (diabetes mellitus), it is very important to have healthy eating habits because your blood sugar (glucose) levels are greatly affected by what you eat and drink. Eating healthy foods in the appropriate amounts, at about the same times every day, can help you:  Control your blood glucose.  Lower your risk of heart disease.  Improve your blood pressure.  Reach or maintain a healthy weight. Every person with diabetes is different, and each person has different needs for a meal plan. Your health care provider may recommend that you work with a diet and nutrition specialist (dietitian) to make a meal plan that is best for you. Your meal plan may vary depending on factors such as:  The calories you need.  The medicines you take.  Your weight.  Your blood glucose, blood pressure, and cholesterol levels.  Your activity level.  Other health conditions you have, such as heart or kidney disease. How do carbohydrates affect me? Carbohydrates, also called carbs, affect your blood glucose level more than any other type of food. Eating carbs naturally raises the amount of glucose in your blood. Carb counting is a method for keeping track of how many carbs you eat. Counting carbs is important to keep your blood glucose at a healthy level, especially if you use insulin or take certain oral diabetes medicines. It is important to know how many carbs you can safely have in each meal. This is different for every person. Your dietitian can help you calculate how many carbs you should have at each meal and for each snack. Foods that contain  carbs include:  Bread, cereal, rice, pasta, and crackers.  Potatoes and corn.  Peas, beans, and lentils.  Milk and yogurt.  Fruit and juice.  Desserts, such as cakes, cookies, ice cream, and candy. How does alcohol affect me? Alcohol can cause a sudden decrease in blood glucose (hypoglycemia), especially if you use insulin or take certain oral diabetes medicines. Hypoglycemia can be a life-threatening condition. Symptoms of hypoglycemia (sleepiness, dizziness, and confusion) are similar to symptoms of having too much alcohol. If your health care provider says that alcohol is safe for you, follow these guidelines:  Limit alcohol intake to no more than 1 drink per day for nonpregnant women and 2 drinks per day for men. One drink equals 12 oz of beer, 5 oz of wine, or 1 oz of hard liquor.  Do not drink on an empty stomach.  Keep yourself hydrated with water, diet soda, or unsweetened iced tea.  Keep in mind that regular soda, juice, and other mixers may contain a lot of sugar and must be counted as carbs. What are tips for following this plan?  Reading food labels  Start by checking the serving size on the "Nutrition Facts" label of packaged foods and drinks. The amount of calories, carbs, fats, and other nutrients listed on the label is based on one serving of the item. Many items contain more than one serving per package.  Check the total grams (g) of carbs in one serving. You can calculate the number of servings of carbs in one serving by dividing  the total carbs by 15. For example, if a food has 30 g of total carbs, it would be equal to 2 servings of carbs.  Check the number of grams (g) of saturated and trans fats in one serving. Choose foods that have low or no amount of these fats.  Check the number of milligrams (mg) of salt (sodium) in one serving. Most people should limit total sodium intake to less than 2,300 mg per day.  Always check the nutrition information of foods  labeled as "low-fat" or "nonfat". These foods may be higher in added sugar or refined carbs and should be avoided.  Talk to your dietitian to identify your daily goals for nutrients listed on the label. Shopping  Avoid buying canned, premade, or processed foods. These foods tend to be high in fat, sodium, and added sugar.  Shop around the outside edge of the grocery store. This includes fresh fruits and vegetables, bulk grains, fresh meats, and fresh dairy. Cooking  Use low-heat cooking methods, such as baking, instead of high-heat cooking methods like deep frying.  Cook using healthy oils, such as olive, canola, or sunflower oil.  Avoid cooking with butter, cream, or high-fat meats. Meal planning  Eat meals and snacks regularly, preferably at the same times every day. Avoid going long periods of time without eating.  Eat foods high in fiber, such as fresh fruits, vegetables, beans, and whole grains. Talk to your dietitian about how many servings of carbs you can eat at each meal.  Eat 4-6 ounces (oz) of lean protein each day, such as lean meat, chicken, fish, eggs, or tofu. One oz of lean protein is equal to: ? 1 oz of meat, chicken, or fish. ? 1 egg. ?  cup of tofu.  Eat some foods each day that contain healthy fats, such as avocado, nuts, seeds, and fish. Lifestyle  Check your blood glucose regularly.  Exercise regularly as told by your health care provider. This may include: ? 150 minutes of moderate-intensity or vigorous-intensity exercise each week. This could be brisk walking, biking, or water aerobics. ? Stretching and doing strength exercises, such as yoga or weightlifting, at least 2 times a week.  Take medicines as told by your health care provider.  Do not use any products that contain nicotine or tobacco, such as cigarettes and e-cigarettes. If you need help quitting, ask your health care provider.  Work with a Social worker or diabetes educator to identify strategies  to manage stress and any emotional and social challenges. Questions to ask a health care provider  Do I need to meet with a diabetes educator?  Do I need to meet with a dietitian?  What number can I call if I have questions?  When are the best times to check my blood glucose? Where to find more information:  American Diabetes Association: diabetes.org  Academy of Nutrition and Dietetics: www.eatright.[employer] of Diabetes and Digestive and Kidney Diseases (NIH): [employer] Summary  A healthy meal plan will help you control your blood glucose and maintain a healthy lifestyle.  Working with a diet and nutrition specialist (dietitian) can help you make a meal plan that is best for you.  Keep in mind that carbohydrates (carbs) and alcohol have immediate effects on your blood glucose levels. It is important to count carbs and to use alcohol carefully. This information is not intended to replace advice given to you by your health care provider. Make sure you discuss any questions you have with  your health care provider. Document Revised: 06/21/2017 Document Reviewed: 08/13/2016 Elsevier Patient Education  2020 [employer].

## 2019-11-04 NOTE — Progress Notes (Signed)
Subjective:  Patient ID: Christine Wilson Wilson, female    DOB: Apr 30, 1961  Age: 59 y.o. MRN: [id number]  CC: Follow-up (to discuss lab results//pt reports no problems)  HPI Hyperglycemia: Last HgbA1c of 6.3 (2017) Per lab results done by cardiology Denies any weight loss, polyphagia, polydipsia, polyuria, change in skin or hair, or paresthesia.  She is also requesting for pelvic exam.  Reviewed past Medical, Social and Family history today.  Outpatient Medications Prior to Visit  Medication Sig Dispense Refill  . amLODipine (NORVASC) 10 MG tablet TAKE 1 TABLET BY MOUTH EVERY DAY 30 tablet 11  . Ascorbic Acid (VITAMIN C) 1000 MG tablet Take 1,000 mg by mouth daily.    Marland Kitchen aspirin 81 MG EC tablet Take 1 tablet (81 mg total) by mouth daily. 30 tablet 11  . atorvastatin (LIPITOR) 40 MG tablet TAKE 1 TABLET BY MOUTH ONCE DAILY AT 6 PM 90 tablet 0  . BIDIL 20-37.5 MG tablet TAKE (2) TABLETS THREE TIMES DAILY. 180 tablet 5  . BIOTIN FORTE PO Take 1 tablet by mouth daily as needed (takes occassionaly).     . carvedilol (COREG) 6.25 MG tablet Take 1 tablet (6.25 mg total) by mouth 2 (two) times daily with a meal. 180 tablet 1  . KLOR-CON M20 20 MEQ tablet TAKE 1  BY MOUTH ONCE DAILY 30 tablet 0  . Multiple Vitamins-Minerals (MULTIVITAMIN WITH MINERALS) tablet Take 1 tablet by mouth daily.    . sacubitril-valsartan (ENTRESTO) 97-103 MG Take 1 tablet by mouth 2 (two) times daily. 180 tablet 3  . spironolactone (ALDACTONE) 25 MG tablet Take 1 tablet (25 mg total) by mouth daily. 90 tablet 3   No facility-administered medications prior to visit.    ROS See HPI  Objective:  BP 120/80   Pulse 77   Temp 98.1 F (36.7 C) (Tympanic)   Ht 5\' 1"  (1.549 m)   Wt 153 lb 9.6 oz (69.7 kg)   SpO2 98%   BMI 29.02 kg/m   BP Readings from Last 3 Encounters:  11/04/19 120/80  06/25/19 (!) 106/58  05/19/19 138/64    Wt Readings from Last 3 Encounters:  11/04/19 153 lb 9.6 oz (69.7 kg)  06/25/19 157  lb 3.2 oz (71.3 kg)  05/19/19 154 lb 14.4 oz (70.3 kg)    Physical Exam Vitals reviewed. Exam conducted with a chaperone present.  Cardiovascular:     Rate and Rhythm: Normal rate.     Pulses: Normal pulses.  Pulmonary:     Effort: Pulmonary effort is normal.  Genitourinary:    Labia:        Right: No rash or tenderness.        Left: No rash or tenderness.      Urethra: Prolapse present.     Vagina: Normal.     Cervix: Normal.     Uterus: Normal.      Adnexa: Right adnexa normal and left adnexa normal.     Rectum: External hemorrhoid present.  Musculoskeletal:     Right lower leg: No edema.     Left lower leg: No edema.  Lymphadenopathy:     Lower Body: No right inguinal adenopathy. No left inguinal adenopathy.  Neurological:     Mental Status: She is alert and oriented to person, place, and time.    Lab Results  Component Value Date   WBC 6.5 11/04/2019   HGB 8.6 Repeated and verified X2. (L) 11/04/2019   HCT 27.5 (L) 11/04/2019   PLT  276.0 11/04/2019   GLUCOSE 123 (H) 10/23/2019   CHOL 157 10/23/2019   TRIG 89 10/23/2019   HDL 63 10/23/2019   LDLCALC 77 10/23/2019   ALT 12 10/23/2019   AST 13 10/23/2019   NA 140 10/23/2019   K 4.9 10/23/2019   CL 104 10/23/2019   CREATININE 1.15 (H) 10/23/2019   BUN 17 10/23/2019   CO2 23 10/23/2019   TSH 1.93 11/04/2019   INR 1.04 06/04/2016   HGBA1C 6.8 (H) 11/04/2019     Assessment & Plan:  This visit occurred during the SARS-CoV-2 public health emergency.  Safety protocols were in place, including screening questions prior to the visit, additional usage of staff PPE, and extensive cleaning of exam room while observing appropriate contact time as indicated for disinfecting solutions.   Christine Wilson was seen today for follow-up.  Diagnoses and all orders for this visit:  Type 2 diabetes mellitus with hyperglycemia, without long-term current use of insulin (HCC) -     Hemoglobin A1c -     TSH -     metFORMIN (GLUCOPHAGE)  500 MG tablet; Take 0.5 tablets (250 mg total) by mouth 2 (two) times daily with a meal.  Microcytic anemia -     CBC w/Diff -     ferrous sulfate 324 (65 Fe) MG TBEC; Take 1 tablet (325 mg total) by mouth daily. With food -     Fecal occult blood, imunochemical; Standing -     Cancel: Iron, TIBC and Ferritin Panel -     Iron, TIBC and Ferritin Panel; Future  Encounter for Papanicolaou smear for cervical cancer screening -     Cytology - PAP( [place])   I am having Christine Wilson Christine Wilson "Christine Wilson" start on metFORMIN and ferrous sulfate. I am also having her maintain her multivitamin with minerals, BIOTIN FORTE PO, aspirin, amLODipine, carvedilol, Entresto, vitamin C, spironolactone, Klor-Con M20, atorvastatin, and BiDil.  Meds ordered this encounter  Medications  . metFORMIN (GLUCOPHAGE) 500 MG tablet    Sig: Take 0.5 tablets (250 mg total) by mouth 2 (two) times daily with a meal.    Dispense:  45 tablet    Refill:  1    Order Specific Question:   Supervising Provider    Answer:   Ronnald Nian [[id number]]  . ferrous sulfate 324 (65 Fe) MG TBEC    Sig: Take 1 tablet (325 mg total) by mouth daily. With food    Dispense:  90 tablet    Refill:  1    Order Specific Question:   Supervising Provider    Answer:   Ronnald Nian [[id number]]    Problem List Items Addressed This Visit      Endocrine   DM (diabetes mellitus) ([place]) - Primary    Last HgbA1c of 6.3 (2017) Per lab results done by cardiology (120s-130s) Denies any weight loss, polyphagia, polydipsia, polyuria, change in skin or hair, or paresthesia.  I spent 65mins explaining the difference between diabetes and prediabetes, possible complications and co morbidities from diabetes, and lifestyle changes needed in order to improve glucose control.  Provided information on necessary diet changes. Hgb A1c of 6.8 Start metformin 250mg  BID with meals F/up in 27months      Relevant Medications   metFORMIN (GLUCOPHAGE) 500 MG  tablet   Other Relevant Orders   Hemoglobin A1c (Completed)   TSH (Completed)     Other   Microcytic anemia    Last colonoscopy 2018: normal Cbc indicates persistent microscopic  anemia. Ordered IFOB x 3 Check iron panel  CBC Latest Ref Rng & Units 11/04/2019 06/04/2016 05/31/2016  WBC 4.0 - 10.5 K/uL 6.5 7.7 8.2  Hemoglobin 12.0 - 15.0 g/dL 8.6 Repeated and verified X2.(L) 13.5 11.9(L)  Hematocrit 36.0 - 46.0 % 27.5(L) 43.0 38.1  Platelets 150.0 - 400.0 K/uL 276.0 289 271        Relevant Medications   ferrous sulfate 324 (65 Fe) MG TBEC   Other Relevant Orders   CBC w/Diff (Completed)   Fecal occult blood, imunochemical   Iron, TIBC and Ferritin Panel    Other Visit Diagnoses    Encounter for Papanicolaou smear for cervical cancer screening       Relevant Orders   Cytology - PAP( [place]) (Completed)       Follow-up: Return in about 6 months (around 05/05/2020) for hyperglycemia and CKD (repeat ALP, HgbA1c and BMP).  Alysia Penna, NP

## 2019-11-04 NOTE — Assessment & Plan Note (Addendum)
Last HgbA1c of 6.3 (2017) Per lab results done by cardiology (120s-130s) Denies any weight loss, polyphagia, polydipsia, polyuria, change in skin or hair, or paresthesia.  I spent explaining the difference between diabetes and prediabetes, possible complications and co morbidities from diabetes, and lifestyle changes needed in order to improve glucose control.  Provided information on necessary diet changes. Hgb A1c of 6.8 Start metformin 250mg  BID with meals F/up in 47months

## 2019-11-05 LAB — CBC WITH DIFFERENTIAL/PLATELET
Basophils Absolute: 0.1 10*3/uL (ref 0.0–0.1)
Basophils Relative: 1.3 % (ref 0.0–3.0)
Eosinophils Absolute: 0 10*3/uL (ref 0.0–0.7)
Eosinophils Relative: 0.7 % (ref 0.0–5.0)
HCT: 27.5 % — ABNORMAL LOW (ref 36.0–46.0)
Hemoglobin: 8.6 g/dL — ABNORMAL LOW (ref 12.0–15.0)
Lymphocytes Relative: 27.1 % (ref 12.0–46.0)
Lymphs Abs: 1.8 10*3/uL (ref 0.7–4.0)
MCHC: 31.4 g/dL (ref 30.0–36.0)
MCV: 67.7 fl — ABNORMAL LOW (ref 78.0–100.0)
Monocytes Absolute: 0.5 10*3/uL (ref 0.1–1.0)
Monocytes Relative: 7.7 % (ref 3.0–12.0)
Neutro Abs: 4.1 10*3/uL (ref 1.4–7.7)
Neutrophils Relative %: 63.2 % (ref 43.0–77.0)
Platelets: 276 10*3/uL (ref 150.0–400.0)
RBC: 4.05 Mil/uL (ref 3.87–5.11)
RDW: 17.4 % — ABNORMAL HIGH (ref 11.5–15.5)
WBC: 6.5 10*3/uL (ref 4.0–10.5)

## 2019-11-05 LAB — TSH: TSH: 1.93 u[IU]/mL (ref 0.35–4.50)

## 2019-11-05 LAB — HEMOGLOBIN A1C: Hgb A1c MFr Bld: 6.8 % — ABNORMAL HIGH (ref 4.6–6.5)

## 2019-11-06 LAB — CYTOLOGY - PAP
Comment: NEGATIVE
Diagnosis: NEGATIVE
High risk HPV: NEGATIVE

## 2019-11-06 MED ORDER — FERROUS SULFATE 324 (65 FE) MG PO TBEC: 1.0000 | DELAYED_RELEASE_TABLET | Freq: Every day | ORAL | 1 refills | Status: DC

## 2019-11-06 MED ORDER — METFORMIN HCL 500 MG PO TABS: 250.0000 mg | ORAL_TABLET | Freq: Two times a day (BID) | ORAL | 1 refills | Status: DC

## 2019-11-07 ENCOUNTER — Encounter: Payer: Self-pay | Admitting: Nurse Practitioner

## 2019-11-07 NOTE — Assessment & Plan Note (Signed)
Last colonoscopy 2018: normal Cbc indicates persistent microscopic anemia. Ordered IFOB x 3 Check iron panel  CBC Latest Ref Rng & Units 11/04/2019 06/04/2016 05/31/2016  WBC 4.0 - 10.5 K/uL 6.5 7.7 8.2  Hemoglobin 12.0 - 15.0 g/dL 8.6 Repeated and verified X2.(L) 13.5 11.9(L)  Hematocrit 36.0 - 46.0 % 27.5(L) 43.0 38.1  Platelets 150.0 - 400.0 K/uL 276.0 289 271

## 2019-11-16 ENCOUNTER — Telehealth: Payer: Self-pay | Admitting: Nurse Practitioner

## 2019-11-16 NOTE — Telephone Encounter (Signed)
Patient is calling and wanting to speak to someone regarding medication. CB is [phone].

## 2019-11-16 NOTE — Telephone Encounter (Signed)
Pt had a questions about metformin 250 mg BID--go over lab result. Pt verbalized understand.

## 2019-12-08 NOTE — Progress Notes (Signed)
Cardiology Clinic Note   Patient Name: Christine Wilson Date of Encounter: 12/09/2019  Primary Care Provider:  Anne Ng, NP Primary Cardiologist:  Chilton Si, MD  Patient Profile    Christine Wilson 59 year old female presents today for follow-up evaluation of her chronic diastolic heart failure, hypertension, and nonobstructive coronary artery disease.  Past Medical History    Past Medical History:  Diagnosis Date  . CHF (congestive heart failure) (HCC) 05/31/2016   cardiologist [facility]  . Hypertension    Past Surgical History:  Procedure Laterality Date  . CARDIAC CATHETERIZATION N/A 06/04/2016   Procedure: Right/Left Heart Cath and Coronary Angiography;  Surgeon: Dolores Patty, MD;  Location: [facility] INVASIVE CV LAB;  Service: Cardiovascular;  Laterality: N/A;  . CESAREAN SECTION      Allergies  No Known Allergies  History of Present Illness    Ms. Guedes has a past medical history of chronic diastolic heart failure, HTN, nonobstructive coronary artery disease.  She is previously a patient of Dr. Gala Romney.  In November 2017 she developed acute systolic and diastolic heart failure with an LVEF of 15--20%.  Her heart failure was thought to be due to hypertensive cardiomyopathy.  Her blood pressure at that time was 205/120.  A chest CT was negative for pulmonary embolism.  She underwent right and left cardiac catheterization that showed moderate nonobstructive coronary artery disease with 60% LAD stenosis.  A cardiac MRI showed diffuse hypokinesis with LVEF of 22% and normal RV function.  There was no delayed enhancement.  She was initiated on Entresto, spironolactone and digoxin.  Her ejection fraction normalized with medical therapy.  An echocardiogram 2/20 showed an LVEF of 60-65%, grade 1 diastolic dysfunction, normal RV function.  She was last seen by the heart failure clinic on 07/2018, at that time her blood pressure was poorly controlled so carvedilol was  increased.  She presented to her dentist 04/2019 and was noted to have a blood pressure of 180/96.  She was last seen by Dr. Duke Salvia on 05/19/2019.  She indicated that she had not been checking her blood pressures much.  She has also been out of her spironolactone and not taking it for around a month.  She is also run out of her atorvastatin.  She indicated that she missed her medications no more than twice per week.  She indicated that since that time she has been checking her blood pressure more regularly and it has been ranging between 130-160/60-80 with a heart rate of 70-90.  She had been under more stress lately related to family and a family member as well as a family friend passing away.  She indicated that she was preparing her meals at home and trying to limit her salt intake.  She was walking 1 time per day for about 10 minutes.  She indicated that she had no exertional chest pain or shortness of breath.  She did however indicate that she has occasional chest discomfort in her left arm.  Her arm pain tended to occur at rest or if she was lifting heavy objects.  She had recently moved and felt that her arm discomfort may have been related to an arm strain.  She denied lower extremity edema, orthopnea and PND.  She presents the clinic today for follow-up evaluation and states she feels well.  She has not been as physically active with COVID-19 pandemic.  She does however do yard work and push more rest.  She does not have any increased  dyspnea with exertion.  She does notice brief occasional cramps in her legs and her neck but this is not happened in several weeks.  They are relieved with rest.  I will give her the salty 6 diet sheet and have her follow-up with Dr. Duke Salvia in 6 months.  Today she denies chest pain, shortness of breath, lower extremity edema, fatigue, palpitations, melena, hematuria, hemoptysis, diaphoresis, weakness, presyncope, syncope, orthopnea, and PND.   Home Medications     Prior to Admission medications   Medication Sig Start Date End Date Taking? Authorizing Provider  amLODipine (NORVASC) 10 MG tablet TAKE 1 TABLET BY MOUTH EVERY DAY 12/16/18   Bensimhon, Bevelyn Buckles, MD  Ascorbic Acid (VITAMIN C) 1000 MG tablet Take 1,000 mg by mouth daily.    [provider]  aspirin 81 MG EC tablet Take 1 tablet (81 mg total) by mouth daily. 08/06/16   Bensimhon, Bevelyn Buckles, MD  atorvastatin (LIPITOR) 40 MG tablet TAKE 1 TABLET BY MOUTH ONCE DAILY AT 6 PM 08/18/19   Bensimhon, Bevelyn Buckles, MD  BIDIL 20-37.5 MG tablet TAKE (2) TABLETS THREE TIMES DAILY. 10/29/19   Chilton Si, MD  BIOTIN FORTE PO Take 1 tablet by mouth daily as needed (takes occassionaly).     [provider]  carvedilol (COREG) 6.25 MG tablet Take 1 tablet (6.25 mg total) by mouth 2 (two) times daily with a meal. 05/13/19   Bensimhon, Bevelyn Buckles, MD  ferrous sulfate 324 (65 Fe) MG TBEC Take 1 tablet (325 mg total) by mouth daily. With food 11/06/19   Nche, Bonna Gains, NP  KLOR-CON M20 20 MEQ tablet TAKE 1  BY MOUTH ONCE DAILY 08/18/19   Bensimhon, Bevelyn Buckles, MD  metFORMIN (GLUCOPHAGE) 500 MG tablet Take 0.5 tablets (250 mg total) by mouth 2 (two) times daily with a meal. 11/06/19   Nche, Bonna Gains, NP  Multiple Vitamins-Minerals (MULTIVITAMIN WITH MINERALS) tablet Take 1 tablet by mouth daily.    [provider]  sacubitril-valsartan (ENTRESTO) 97-103 MG Take 1 tablet by mouth 2 (two) times daily. 05/13/19   Bensimhon, Bevelyn Buckles, MD  spironolactone (ALDACTONE) 25 MG tablet Take 1 tablet (25 mg total) by mouth daily. 05/19/19   Chilton Si, MD    Family History    Family History  Problem Relation Age of Onset  . Diabetes Mother   . Hypertension Mother   . Heart disease Mother 20       a. Passed away due to MI  . Breast cancer Mother   . Heart disease Sister   . Diabetes Brother   . Cancer Maternal Grandmother   . Hypertension Maternal Grandmother   . Breast cancer Maternal  Grandmother   . Breast cancer Maternal Aunt   . Cancer Paternal Aunt   . Colon cancer Neg Hx    She indicated that her mother is deceased. She indicated that her father is alive. She indicated that all of her four sisters are alive. She indicated that all of her four brothers are alive. She indicated that her maternal grandmother is deceased. She indicated that her maternal grandfather is deceased. She indicated that her paternal grandmother is deceased. She indicated that her paternal grandfather is deceased. She indicated that the status of her maternal aunt is unknown. She indicated that the status of her paternal aunt is unknown. She indicated that the status of her neg hx is unknown.  Social History    Social History   Socioeconomic History  . Marital  status: Divorced    Spouse name: Not on file  . Number of children: Not on file  . Years of education: Not on file  . Highest education level: Not on file  Occupational History  . Not on file  Tobacco Use  . Smoking status: Never Smoker  . Smokeless tobacco: Never Used  Substance and Sexual Activity  . Alcohol use: No  . Drug use: No  . Sexual activity: Not on file  Other Topics Concern  . Not on file  Social History Narrative  . Not on file   Social Determinants of Health   Financial Resource Strain:   . Difficulty of Paying Living Expenses:   Food Insecurity:   . Worried About Programme researcher, broadcasting/film/video in the Last Year:   . Barista in the Last Year:   Transportation Needs:   . Freight forwarder (Medical):   Marland Kitchen Lack of Transportation (Non-Medical):   Physical Activity:   . Days of Exercise per Week:   . Minutes of Exercise per Session:   Stress:   . Feeling of Stress :   Social Connections:   . Frequency of Communication with Friends and Family:   . Frequency of Social Gatherings with Friends and Family:   . Attends Religious Services:   . Active Member of Clubs or Organizations:   . Attends Tax inspector Meetings:   Marland Kitchen Marital Status:   Intimate Partner Violence:   . Fear of Current or Ex-Partner:   . Emotionally Abused:   Marland Kitchen Physically Abused:   . Sexually Abused:      Review of Systems    General:  No chills, fever, night sweats or weight changes.  Cardiovascular:  No chest pain, dyspnea on exertion, edema, orthopnea, palpitations, paroxysmal nocturnal dyspnea. Dermatological: No rash, lesions/masses Respiratory: No cough, dyspnea Urologic: No hematuria, dysuria Abdominal:   No nausea, vomiting, diarrhea, bright red blood per rectum, melena, or hematemesis Neurologic:  No visual changes, wkns, changes in mental status. All other systems reviewed and are otherwise negative except as noted above.  Physical Exam    VS:  BP (!) 126/58   Pulse 82   Temp 98.2 F (36.8 C)   Ht 5\' 1"  (1.549 m)   Wt 154 lb 9.6 oz (70.1 kg)   SpO2 97%   BMI 29.21 kg/m  , BMI Body mass index is 29.21 kg/m. GEN: Well nourished, well developed, in no acute distress. HEENT: normal. Neck: Supple, no JVD, carotid bruits, or masses. Cardiac: RRR, no murmurs, rubs, or gallops. No clubbing, cyanosis, edema.  Radials/DP/PT 2+ and equal bilaterally.  Respiratory:  Respirations regular and unlabored, clear to auscultation bilaterally. GI: Soft, nontender, nondistended, BS + x 4. MS: no deformity or atrophy. Skin: warm and dry, no rash. Neuro:  Strength and sensation are intact. Psych: Normal affect.  Accessory Clinical Findings    ECG personally reviewed by me today-none today.  EKG 05/19/2019 Normal sinus rhythm 78 bpm  Echocardiogram 08/27/2018 IMPRESSIONS    1. The left ventricle has normal systolic function of 60-65%. The cavity  size is normal. There is no increased left ventricular wall thickness.  Echo evidence of impaired diastolic relaxation indeterminate filling  pressure.  2. The right ventricle has normal systolic function. The cavity in normal  in size. There is no  increase in right ventricular wall thickness. Right  ventricular systolic pressure is mildly elevated with an estimated  pressure of 35.7 mmHg.  3. The  mitral valve is normal in structure No evidence of mitral valve  stenosis.  4. The aortic valve is tricuspid.   Assessment & Plan   1.  Essential hypertension-BP today 126/58.  Well-controlled at home. Continueamlodipine, BiDil, carvedilol, Entresto and spironolactone Heart healthy low-sodium diet-salty 6 given Increase physical activity as tolerated  Chronic diastolic heart failure-euvolemic today.  No increased activity intolerance or DOE.  Echocardiogram 2/20 showed an LVEF of 60-65% and impaired diastolic relaxation with intermediate filling. Continue amlodipine, BiDil, carvedilol, Entresto and spironolactone  Pure hypercholesterolemia-10/23/2019: Cholesterol, Total 157; HDL 63; LDL Chol Calc (NIH) 77; Triglycerides 89 Continue atorvastatin Heart healthy low-sodium high-fiber diet Increase physical activity as tolerated  Coronary artery disease-no chest pain today.  Cardiac catheterization 2007 showed 60% LAD stenosis. Continue aspirin, atorvastatin, and carvedilol Heart healthy low-sodium diet Increase physical activity as tolerated  Disposition: Follow-up with Dr. Oval Linsey in 6 months.  Jossie Ng. Anyah Swallow NP-C    12/09/2019, 3:15 PM [place] [address] Office [phone] Fax [phone]

## 2019-12-09 ENCOUNTER — Ambulatory Visit (INDEPENDENT_AMBULATORY_CARE_PROVIDER_SITE_OTHER): Payer: 59 | Admitting: General Practice

## 2019-12-09 ENCOUNTER — Encounter: Payer: Self-pay | Admitting: General Practice

## 2019-12-09 ENCOUNTER — Other Ambulatory Visit (INDEPENDENT_AMBULATORY_CARE_PROVIDER_SITE_OTHER): Payer: 59

## 2019-12-09 ENCOUNTER — Other Ambulatory Visit: Payer: Self-pay

## 2019-12-09 VITALS — BP 126/58 | HR 82 | Temp 98.2°F | Ht 61.0 in | Wt 154.6 lb

## 2019-12-09 DIAGNOSIS — E7800 Pure hypercholesterolemia, unspecified: Secondary | ICD-10-CM | POA: Diagnosis not present

## 2019-12-09 DIAGNOSIS — I2510 Atherosclerotic heart disease of native coronary artery without angina pectoris: Secondary | ICD-10-CM | POA: Diagnosis not present

## 2019-12-09 DIAGNOSIS — I5032 Chronic diastolic (congestive) heart failure: Secondary | ICD-10-CM | POA: Diagnosis not present

## 2019-12-09 DIAGNOSIS — D509 Iron deficiency anemia, unspecified: Secondary | ICD-10-CM | POA: Diagnosis not present

## 2019-12-09 DIAGNOSIS — I10 Essential (primary) hypertension: Secondary | ICD-10-CM

## 2019-12-09 NOTE — Patient Instructions (Signed)
Medication Instructions:  The current medical regimen is effective;  continue present plan and medications as directed. Please refer to the Current Medication list given to you today. *If you need a refill on your cardiac medications before your next appointment, please call your pharmacy*  Special Instructions PLEASE READ AND FOLLOW SALTY 6-ATTACHED  Follow-Up: Your next appointment:  6 month(s) Please call our office 2 months in advance to schedule this appointment In Person with Chilton Si, MD  At [facility], you and your health needs are our priority.  As part of our continuing mission to provide you with exceptional heart care, we have created designated Provider Care Teams.  These Care Teams include your primary Cardiologist (physician) and Advanced Practice Providers (APPs -  Physician Assistants and Nurse Practitioners) who all work together to provide you with the care you need, when you need it.

## 2019-12-10 LAB — FECAL OCCULT BLOOD, IMMUNOCHEMICAL: Fecal Occult Bld: NEGATIVE

## 2020-01-07 ENCOUNTER — Other Ambulatory Visit: Payer: Self-pay

## 2020-01-07 MED ORDER — AMLODIPINE BESYLATE 10 MG PO TABS: 10.0000 mg | ORAL_TABLET | Freq: Every day | ORAL | 11 refills | Status: DC

## 2020-01-07 NOTE — Telephone Encounter (Signed)
Pt sees Dr. Edgeley now. Please address 

## 2020-01-21 ENCOUNTER — Other Ambulatory Visit: Payer: Self-pay

## 2020-01-21 MED ORDER — CARVEDILOL 6.25 MG PO TABS: 6.2500 mg | ORAL_TABLET | Freq: Two times a day (BID) | ORAL | 2 refills | Status: DC

## 2020-03-25 ENCOUNTER — Other Ambulatory Visit: Payer: Self-pay | Admitting: Nurse Practitioner

## 2020-03-25 DIAGNOSIS — Z0000 Encounter for general adult medical examination without abnormal findings: Secondary | ICD-10-CM

## 2020-04-08 ENCOUNTER — Ambulatory Visit
Admission: RE | Admit: 2020-04-08 | Discharge: 2020-04-08 | Disposition: A | Discharge status: Home / Self Care | Payer: 59 | Source: Ambulatory Visit | Attending: Nurse Practitioner | Admitting: Nurse Practitioner

## 2020-04-08 ENCOUNTER — Other Ambulatory Visit: Payer: Self-pay

## 2020-04-08 DIAGNOSIS — Z0000 Encounter for general adult medical examination without abnormal findings: Secondary | ICD-10-CM

## 2020-04-28 ENCOUNTER — Telehealth: Payer: Self-pay | Admitting: Cardiovascular Disease

## 2020-04-28 NOTE — Telephone Encounter (Signed)
lvm for patient to return call to get appointment scheduled with [place] from recall list 

## 2020-05-28 ENCOUNTER — Other Ambulatory Visit: Payer: Self-pay | Admitting: Cardiovascular Disease

## 2020-06-06 ENCOUNTER — Other Ambulatory Visit: Payer: Self-pay | Admitting: Cardiovascular Disease

## 2020-06-06 DIAGNOSIS — I5041 Acute combined systolic (congestive) and diastolic (congestive) heart failure: Secondary | ICD-10-CM

## 2020-06-06 DIAGNOSIS — I160 Hypertensive urgency: Secondary | ICD-10-CM

## 2020-06-06 MED ORDER — ENTRESTO 97-103 MG PO TABS: 1.0000 | ORAL_TABLET | Freq: Two times a day (BID) | ORAL | 2 refills | Status: DC

## 2020-06-06 NOTE — Telephone Encounter (Signed)
*  STAT* If patient is at the pharmacy, call can be transferred to refill team.   1. Which medications need to be refilled? (please list name of each medication and dose if known) Entresto  2. Which pharmacy/location (including street and city if local pharmacy) is medication to be sent to? [facility] [phone]  3. Do they need a 30 day or 90 day supply? 30 not sure

## 2020-06-06 NOTE — Telephone Encounter (Signed)
Rx has been sent to the pharmacy electronically. ° °

## 2020-06-10 ENCOUNTER — Other Ambulatory Visit: Payer: Self-pay

## 2020-06-10 ENCOUNTER — Ambulatory Visit: Admission: RE | Admit: 2020-06-10 | Payer: 59 | Source: Ambulatory Visit

## 2020-06-13 ENCOUNTER — Other Ambulatory Visit: Payer: Self-pay | Admitting: Nurse Practitioner

## 2020-06-13 DIAGNOSIS — N6310 Unspecified lump in the right breast, unspecified quadrant: Secondary | ICD-10-CM

## 2020-06-22 ENCOUNTER — Ambulatory Visit: Payer: 59 | Admitting: Cardiovascular Disease

## 2020-06-22 NOTE — Progress Notes (Deleted)
Cardiology Office Note   Date:  06/22/2020   ID:  Christine Wilson, DOB February 14, 1961, MRN [id number]  PCP:  Anne Ng, NP  Cardiologist:   Chilton Si, MD   No chief complaint on file.    History of Present Illness: Christine Wilson is a 59 y.o. female chronic diastolic heart failure, hypertension, nonobstructive CAD who presents to establish care.  She was previously a patient of Dr. Gala Romney.  She developed acute systolic and diastolic heart failure (LVEF 15 to 20%) in November 2017.  This was thought to be due to hypertensive cardiomyopathy.  Her blood pressure was 205/120.  Chest CT-a was negative for pulmonary embolism.  She had a right and left heart catheterization at that time that revealed moderate, nonobstructive CAD with 60% LAD lesion.  Cardiac MRI revealed diffuse hypokinesis with LVEF 22% and normal RV function.  There is no delayed enhancement.  She was started on Entresto, spironolactone and digoxin.  Over time to following up with the heart failure clinic her ejection fraction has normalized.  Most recently she had an echo 08/2018 that revealed LVEF 60 to 65% with grade 1 diastolic dysfunction.  RV function was normal.  She last saw Dr. Teena Dunk on 07/2018 and her blood pressure was poorly controlled so carvedilol was increased.  At her last appointment her blood pressure was poorly controlled.  It was noted that she had been out of spironolactone for several months.  It was restarted and on follow-up her blood pressure was much better.   Past Medical History:  Diagnosis Date  . CHF (congestive heart failure) (HCC) 05/31/2016   cardiologist at [facility]  . Hypertension     Past Surgical History:  Procedure Laterality Date  . CARDIAC CATHETERIZATION N/A 06/04/2016   Procedure: Right/Left Heart Cath and Coronary Angiography;  Surgeon: Dolores Patty, MD;  Location: [facility] INVASIVE CV LAB;  Service: Cardiovascular;  Laterality: N/A;  . CESAREAN SECTION       Current  Outpatient Medications  Medication Sig Dispense Refill  . amLODipine (NORVASC) 10 MG tablet Take 1 tablet (10 mg total) by mouth daily. 30 tablet 11  . Ascorbic Acid (VITAMIN C) 1000 MG tablet Take 1,000 mg by mouth daily.    Marland Kitchen aspirin 81 MG EC tablet Take 1 tablet (81 mg total) by mouth daily. 30 tablet 11  . atorvastatin (LIPITOR) 40 MG tablet TAKE 1 TABLET BY MOUTH EVERY DAY AT 6 PM 90 tablet 0  . BIDIL 20-37.5 MG tablet TAKE (2) TABLETS THREE TIMES DAILY. 180 tablet 5  . BIOTIN FORTE PO Take 1 tablet by mouth daily as needed (takes occassionaly).     . carvedilol (COREG) 6.25 MG tablet Take 1 tablet (6.25 mg total) by mouth 2 (two) times daily with a meal. 180 tablet 2  . KLOR-CON M20 20 MEQ tablet TAKE 1  BY MOUTH ONCE DAILY 30 tablet 0  . sacubitril-valsartan (ENTRESTO) 97-103 MG Take 1 tablet by mouth 2 (two) times daily. 180 tablet 2  . spironolactone (ALDACTONE) 25 MG tablet TAKE 1 TABLET(25 MG) BY MOUTH DAILY 90 tablet 3   No current facility-administered medications for this visit.    Allergies:   Patient has no known allergies.    Social History:  The patient  reports that she has never smoked. She has never used smokeless tobacco. She reports that she does not drink alcohol and does not use drugs.   Family History:  The patient's family history includes Breast cancer  in her maternal aunt, maternal grandmother, and mother; Cancer in her maternal grandmother and paternal aunt; Diabetes in her brother and mother; Heart disease in her sister; Heart disease (age of onset: 6) in her mother; Hypertension in her maternal grandmother and mother.    ROS:  Please see the history of present illness.   Otherwise, review of systems are positive for none.   All other systems are reviewed and negative.    PHYSICAL EXAM: VS:  There were no vitals taken for this visit. , BMI There is no height or weight on file to calculate BMI. GENERAL:  Well appearing HEENT:  Pupils equal round and  reactive, fundi not visualized, oral mucosa unremarkable NECK:  No jugular venous distention, waveform within normal limits, carotid upstroke brisk and symmetric, no bruits, no thyromegaly LYMPHATICS:  No cervical adenopathy LUNGS:  Clear to auscultation bilaterally HEART:  RRR.  PMI not displaced or sustained,S1 and S2 within normal limits, no S3, no S4, no clicks, no rubs, no murmurs ABD:  Flat, positive bowel sounds normal in frequency in pitch, no bruits, no rebound, no guarding, no midline pulsatile mass, no hepatomegaly, no splenomegaly EXT:  2 plus pulses throughout, no edema, no cyanosis no clubbing SKIN:  No rashes no nodules NEURO:  Cranial nerves II through XII grossly intact, motor grossly intact throughout PSYCH:  Cognitively intact, oriented to person place and time    EKG:  EKG is ordered today. The ekg ordered today demonstrates sinus rhythm.  Rate 70 bpm.  Echo 08/27/18: IMPRESSIONS   1. The left ventricle has normal systolic function of 60-65%. The cavity size is normal. There is no increased left ventricular wall thickness. Echo evidence of impaired diastolic relaxation indeterminate filling pressure.  2. The right ventricle has normal systolic function. The cavity in normal in size. There is no increase in right ventricular wall thickness. Right ventricular systolic pressure is mildly elevated with an estimated pressure of 35.7 mmHg.  3. The mitral valve is normal in structure No evidence of mitral valve stenosis.  4. The aortic valve is tricuspid.  LHC/RHC 06/04/16: Findings:  Ao = 141/85 (105) LV =  141/11 RA = 4 RV = 29/1 PA = 34/12 (21) PCW = 8 Fick cardiac output/index = 2.6/1.6 Thermo CO/CI = 2.9/1.8 PVR = 5.0 WU SVR = 3087 FA sat = 95% PA sat = 42%, 44%  Assessment: 1. 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 Markedly depressed CO with severely elevated SVR 4. Low filling  pressures   Recent Labs: 10/23/2019: ALT 12; BUN 17; Creatinine, Ser 1.15; Potassium 4.9; Sodium 140 11/04/2019: Hemoglobin 8.6 Repeated and verified X2.; Platelets 276.0; TSH 1.93    Lipid Panel    Component Value Date/Time   CHOL 157 10/23/2019 1009   TRIG 89 10/23/2019 1009   HDL 63 10/23/2019 1009   CHOLHDL 2.5 10/23/2019 1009   CHOLHDL 3.2 05/31/2016 1726   VLDL 36 (H) 05/31/2016 1726   LDLCALC 77 10/23/2019 1009      Wt Readings from Last 3 Encounters:  12/09/19 154 lb 9.6 oz (70.1 kg)  11/04/19 153 lb 9.6 oz (69.7 kg)  06/25/19 157 lb 3.2 oz (71.3 kg)      ASSESSMENT AND PLAN:  # Essential hypertension: We will not make any adjustments to her medications at this time.  She has been out of spironolactone for over a month.  It was last filled in August  2020.  We will refill this medication.  She will track her blood pressure at home and bring to follow-up with our pharmacist  # Chronic diastolic heart failure: LVEF was previously reduced but has normalized.  She has no heart failure symptoms.  Her systolic dysfunction was due to hypertension.  We will aggressively treat her hypertension as above.  Continue amlodipine, BiDil, carvedilol, Entresto and spironolactone  # Pure hypercholesterolemia She has been out of her statin for several months.  We discussed how this is related to his coronary disease.  Recheck lipids and a CMP in 2 to 3 months  # Moderate CAD: 60% LAD stenosis noted on cath in 2017.  Continue aspirin and restart atorvastatin as above.  Continue carvedilol.   Current medicines are reviewed at length with the patient today.  The patient does not have concerns regarding medicines.  The following changes have been made:  no change  Labs/ tests ordered today include:   No orders of the defined types were placed in this encounter.    Disposition:   FU with pharmacist in 4 to 6 weeks for hypertension.  Follow-up with me in 6  months.    Signed, Rayven Rettig C. Duke Salvia, MD, [facility]  06/22/2020 7:58 AM    [facility]

## 2020-06-23 ENCOUNTER — Ambulatory Visit (INDEPENDENT_AMBULATORY_CARE_PROVIDER_SITE_OTHER): Payer: 59 | Admitting: Cardiovascular Disease

## 2020-06-23 ENCOUNTER — Encounter: Payer: Self-pay | Admitting: Cardiovascular Disease

## 2020-06-23 ENCOUNTER — Other Ambulatory Visit: Payer: Self-pay

## 2020-06-23 VITALS — BP 125/61 | HR 69 | Temp 97.3°F | Ht 61.0 in | Wt 154.6 lb

## 2020-06-23 DIAGNOSIS — I10 Essential (primary) hypertension: Secondary | ICD-10-CM | POA: Diagnosis not present

## 2020-06-23 DIAGNOSIS — I5032 Chronic diastolic (congestive) heart failure: Secondary | ICD-10-CM

## 2020-06-23 DIAGNOSIS — Z5181 Encounter for therapeutic drug level monitoring: Secondary | ICD-10-CM

## 2020-06-23 DIAGNOSIS — R252 Cramp and spasm: Secondary | ICD-10-CM | POA: Diagnosis not present

## 2020-06-23 HISTORY — DX: Chronic diastolic (congestive) heart failure: I50.32

## 2020-06-23 NOTE — Progress Notes (Signed)
Cardiology Office Note   Date:  06/23/2020   ID:  Christine Wilson, DOB [birth year], MRN [id number]  PCP:  Anne Ng, NP  Cardiologist:   Chilton Si, MD   No chief complaint on file.    History of Present Illness: Christine Wilson is a 59 y.o. female chronic diastolic heart failure, hypertension, nonobstructive CAD who presents for follow up. She was previously a patient of Dr. Gala Romney.  She developed acute systolic and diastolic heart failure (LVEF 15 to 20%) in November 2017.  This was thought to be due to hypertensive cardiomyopathy.  Her blood pressure was 205/120.  Chest CT-a was negative for pulmonary embolism.  She had a right and left heart catheterization at that time that revealed moderate, nonobstructive CAD with 60% LAD lesion.  Cardiac MRI revealed diffuse hypokinesis with LVEF 22% and normal RV function.  There is no delayed enhancement.  She was started on Entresto, spironolactone and digoxin.  Over time to following up with the heart failure clinic her ejection fraction has normalized.  Most recently she had an echo 08/2018 that revealed LVEF 60 to 65% with grade 1 diastolic dysfunction.  RV function was normal.  She last saw Dr. Teressa Lower on 07/2018 and her blood pressure was poorly controlled so carvedilol was increased.  At her last appointment her blood pressure was poorly controlled.  It was noted that she had been out of spironolactone for several months.  It was restarted and on follow-up her blood pressure was much better.  She struggles with spasms in her L neck or a cramp in her legs.  She walks for exercise almost daily.  She also uses an exercise bike.  She has been stressed working at a call center.  She has no edema but her ankles get tired. She has no orthopnea or PND.    Past Medical History:  Diagnosis Date  . CHF (congestive heart failure) (HCC) 05/31/2016   cardiologist at [facility]  . Chronic diastolic heart failure (HCC) 06/23/2020  . Hypertension       Past Surgical History:  Procedure Laterality Date  . CARDIAC CATHETERIZATION N/A 06/04/2016   Procedure: Right/Left Heart Cath and Coronary Angiography;  Surgeon: Dolores Patty, MD;  Location: [facility] INVASIVE CV LAB;  Service: Cardiovascular;  Laterality: N/A;  . CESAREAN SECTION       Current Outpatient Medications  Medication Sig Dispense Refill  . amLODipine (NORVASC) 10 MG tablet Take 1 tablet (10 mg total) by mouth daily. 30 tablet 11  . Ascorbic Acid (VITAMIN C) 1000 MG tablet Take 1,000 mg by mouth daily.    Marland Kitchen aspirin 81 MG EC tablet Take 1 tablet (81 mg total) by mouth daily. 30 tablet 11  . atorvastatin (LIPITOR) 40 MG tablet TAKE 1 TABLET BY MOUTH EVERY DAY AT 6 PM 90 tablet 0  . BIDIL 20-37.5 MG tablet TAKE (2) TABLETS THREE TIMES DAILY. 180 tablet 5  . BIOTIN FORTE PO Take 1 tablet by mouth daily as needed (takes occassionaly).     . carvedilol (COREG) 6.25 MG tablet Take 1 tablet (6.25 mg total) by mouth 2 (two) times daily with a meal. 180 tablet 2  . sacubitril-valsartan (ENTRESTO) 97-103 MG Take 1 tablet by mouth 2 (two) times daily. 180 tablet 2  . spironolactone (ALDACTONE) 25 MG tablet TAKE 1 TABLET(25 MG) BY MOUTH DAILY 90 tablet 3   No current facility-administered medications for this visit.    Allergies:   Patient has no known  allergies.    Social History:  The patient  reports that she has never smoked. She has never used smokeless tobacco. She reports that she does not drink alcohol and does not use drugs.   Family History:  The patient's family history includes Breast cancer in her maternal aunt, maternal grandmother, and mother; Cancer in her maternal grandmother and paternal aunt; Diabetes in her brother and mother; Heart disease in her sister; Heart disease (age of onset: 44) in her mother; Hypertension in her maternal grandmother and mother.    ROS:  Please see the history of present illness.   Otherwise, review of systems are positive for none.    All other systems are reviewed and negative.    PHYSICAL EXAM: VS:  BP 125/61   Pulse 69   Temp (!) 97.3 F (36.3 C)   Ht 5\' 1"  (1.549 m)   Wt 154 lb 9.6 oz (70.1 kg)   SpO2 98%   BMI 29.21 kg/m  , BMI Body mass index is 29.21 kg/m. GENERAL:  Well appearing HEENT: Pupils equal round and reactive, fundi not visualized, oral mucosa unremarkable NECK:  No jugular venous distention, waveform within normal limits, carotid upstroke brisk and symmetric, no bruits LUNGS:  Clear to auscultation bilaterally HEART:  RRR.  PMI not displaced or sustained,S1 and S2 within normal limits, no S3, no S4, no clicks, no rubs, no murmurs ABD:  Flat, positive bowel sounds normal in frequency in pitch, no bruits, no rebound, no guarding, no midline pulsatile mass, no hepatomegaly, no splenomegaly EXT:  2 plus pulses throughout, no edema, no cyanosis no clubbing SKIN:  No rashes no nodules NEURO:  Cranial nerves II through XII grossly intact, motor grossly intact throughout PSYCH:  Cognitively intact, oriented to person place and time  EKG:  EKG is ordered today. The ekg ordered 05/19/19 demonstrates sinus rhythm.  Rate 70 bpm. 06/23/20: Sinus rhythm.  Rate 69 bpm.  Echo 08/27/18: IMPRESSIONS   1. The left ventricle has normal systolic function of 60-65%. The cavity size is normal. There is no increased left ventricular wall thickness. Echo evidence of impaired diastolic relaxation indeterminate filling pressure.  2. The right ventricle has normal systolic function. The cavity in normal in size. There is no increase in right ventricular wall thickness. Right ventricular systolic pressure is mildly elevated with an estimated pressure of 35.7 mmHg.  3. The mitral valve is normal in structure No evidence of mitral valve stenosis.  4. The aortic valve is tricuspid.  LHC/RHC 06/04/16: Findings:  Ao = 141/85 (105) LV =  141/11 RA = 4 RV = 29/1 PA = 34/12 (21) PCW = 8 Fick cardiac output/index =  2.6/1.6 Thermo CO/CI = 2.9/1.8 PVR = 5.0 WU SVR = 3087 FA sat = 95% PA sat = 42%, 44%  Assessment: 1. 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 Markedly depressed CO with severely elevated SVR 4. Low filling pressures   Recent Labs: 10/23/2019: ALT 12; BUN 17; Creatinine, Ser 1.15; Potassium 4.9; Sodium 140 11/04/2019: Hemoglobin 8.6 Repeated and verified X2.; Platelets 276.0; TSH 1.93    Lipid Panel    Component Value Date/Time   CHOL 157 10/23/2019 1009   TRIG 89 10/23/2019 1009   HDL 63 10/23/2019 1009   CHOLHDL 2.5 10/23/2019 1009   CHOLHDL 3.2 05/31/2016 1726   VLDL 36 (H) 05/31/2016 1726   LDLCALC 77 10/23/2019 1009      Wt  Readings from Last 3 Encounters:  06/23/20 154 lb 9.6 oz (70.1 kg)  12/09/19 154 lb 9.6 oz (70.1 kg)  11/04/19 153 lb 9.6 oz (69.7 kg)      ASSESSMENT AND PLAN:  # Essential hypertension: Blood pressure is much better controlled.  At her last visit here she has been out of amlodipine.  Continue amlodipine, carvedilol, BiDil, spironolactone, and Entresto.  # Chronic diastolic heart failure: LVEF was previously reduced but has normalized. She is euvolemic on exam.  Continue medications as above.  She is no longer on potassium but now has some cramping.  Check a basic metabolic panel and a magnesium  # Moderate CAD: # Pure hypercholesterolemia 60% LAD stenosis noted on cath in 2017.  Continue aspirin and atorvastatin as above.  Continue carvedilol.  LDL goal less than 70.   Current medicines are reviewed at length with the patient today.  The patient does not have concerns regarding medicines.  The following changes have been made:  no change  Labs/ tests ordered today include:   Orders Placed This Encounter  Procedures  . Magnesium  . Basic metabolic panel  . EKG 12-Lead     Disposition:   Follow up in 1 year    Signed, Raybon Conard C. Duke Salvia, MD, [facility]  06/23/2020  3:09 PM    [facility]

## 2020-06-23 NOTE — Patient Instructions (Addendum)
Medication Instructions:  Your physician recommends that you continue on your current medications as directed. Please refer to the Current Medication list given to you today.  *If you need a refill on your cardiac medications before your next appointment, please call your pharmacy*  Lab Work BMET/MAGNESIUM TODAY   If you have labs (blood work) drawn today and your tests are completely normal, you will receive your results only by: Marland Kitchen MyChart Message (if you have MyChart) OR . A paper copy in the mail If you have any lab test that is abnormal or we need to change your treatment, we will call you to review the results.  Testing/Procedures: NONE  Follow-Up: At [facility], you and your health needs are our priority.  As part of our continuing mission to provide you with exceptional heart care, we have created designated Provider Care Teams.  These Care Teams include your primary Cardiologist (physician) and Advanced Practice Providers (APPs -  Physician Assistants and Nurse Practitioners) who all work together to provide you with the care you need, when you need it.  We recommend signing up for the patient portal called "MyChart".  Sign up information is provided on this After Visit Summary.  MyChart is used to connect with patients for Virtual Visits (Telemedicine).  Patients are able to view lab/test results, encounter notes, upcoming appointments, etc.  Non-urgent messages can be sent to your provider as well.   To learn more about what you can do with MyChart, go to [email or web address].    Your next appointment:   12 month(s)   You will receive a reminder letter in the mail two months in advance. If you don't receive a letter, please call our office to schedule the follow-up appointment. The format for your next appointment:   In Person  Provider:   You may see Chilton Si, MD or one of the following Advanced Practice Providers on your designated Care Team:    Corine Shelter,  PA-C  [place], [place]  Edd Fabian, [place]

## 2020-06-24 LAB — MAGNESIUM: Magnesium: 1.9 mg/dL (ref 1.6–2.3)

## 2020-06-24 LAB — BASIC METABOLIC PANEL
BUN/Creatinine Ratio: 13 (ref 12–28)
BUN: 15 mg/dL (ref 8–27)
CO2: 22 mmol/L (ref 20–29)
Calcium: 9.9 mg/dL (ref 8.7–10.3)
Chloride: 108 mmol/L — ABNORMAL HIGH (ref 96–106)
Creatinine, Ser: 1.18 mg/dL — ABNORMAL HIGH (ref 0.57–1.00)
GFR calc Af Amer: 58 mL/min/{1.73_m2} — ABNORMAL LOW (ref >59)
GFR calc non Af Amer: 50 mL/min/{1.73_m2} — ABNORMAL LOW (ref >59)
Glucose: 92 mg/dL (ref 65–99)
Potassium: 4.7 mmol/L (ref 3.5–5.2)
Sodium: 145 mmol/L — ABNORMAL HIGH (ref 134–144)

## 2020-08-10 ENCOUNTER — Other Ambulatory Visit: Payer: Self-pay | Admitting: Cardiovascular Disease

## 2020-08-11 ENCOUNTER — Other Ambulatory Visit: Payer: Self-pay | Admitting: Nurse Practitioner

## 2020-08-11 ENCOUNTER — Other Ambulatory Visit: Payer: Self-pay

## 2020-08-11 ENCOUNTER — Ambulatory Visit
Admission: RE | Admit: 2020-08-11 | Discharge: 2020-08-11 | Disposition: A | Discharge status: Home / Self Care | Payer: 59 | Source: Ambulatory Visit | Attending: Nurse Practitioner | Admitting: Nurse Practitioner

## 2020-08-11 DIAGNOSIS — N6310 Unspecified lump in the right breast, unspecified quadrant: Secondary | ICD-10-CM

## 2020-08-23 ENCOUNTER — Other Ambulatory Visit: Payer: Self-pay | Admitting: Cardiovascular Disease

## 2020-08-26 ENCOUNTER — Other Ambulatory Visit (HOSPITAL_COMMUNITY): Payer: Self-pay | Admitting: Diagnostic Radiology

## 2020-08-26 ENCOUNTER — Ambulatory Visit
Admission: RE | Admit: 2020-08-26 | Discharge: 2020-08-26 | Disposition: A | Discharge status: Home / Self Care | Payer: 59 | Source: Ambulatory Visit | Attending: Nurse Practitioner | Admitting: Nurse Practitioner

## 2020-08-26 ENCOUNTER — Other Ambulatory Visit: Payer: Self-pay

## 2020-08-26 DIAGNOSIS — N6310 Unspecified lump in the right breast, unspecified quadrant: Secondary | ICD-10-CM

## 2020-09-15 ENCOUNTER — Telehealth: Payer: Self-pay

## 2020-09-15 NOTE — Telephone Encounter (Signed)
Pt needs a refill of sacubitril-valsartan (ENTRESTO) 97-103 MG  Pharmacy is [facility] #[id number] - [place], [place] - [address] AT [address]   Pt is requesting a call back with advice and  questions concerning this refill. Her call back number is [phone]  Thank you

## 2020-09-16 ENCOUNTER — Telehealth: Payer: Self-pay | Admitting: Cardiovascular Disease

## 2020-09-16 NOTE — Telephone Encounter (Signed)
Spoke to patient she stated she is presently at her pharmacy getting Entresto.Advised company does not sample 97/103 mg.Stated it was expensive.Advised I will mail a patient assistance application.Advised to bring back to office when completed along with proof of her income and give to Dr.Chamizal's nurse.

## 2020-09-16 NOTE — Telephone Encounter (Signed)
Patient calling the office for samples of medication:   1.  What medication and dosage are you requesting samples for? sacubitril-valsartan (ENTRESTO) 97-103 MG  2.  Are you currently out of this medication?   Yes, patient states she has been completely out of medication for 2 days. She states he is currently unable to afford refills and she would like to know if there are samples or coupons at the office. If not, she would like to discuss assistance programs.

## 2020-09-19 NOTE — Telephone Encounter (Signed)
LVM informing patient to call office and schedule a follow up appointment for medication refills.

## 2020-09-20 NOTE — Telephone Encounter (Signed)
LVM for patient to return call. 

## 2020-09-20 NOTE — Telephone Encounter (Signed)
She needs to reach to cardiology for entresto refill

## 2020-09-26 NOTE — Telephone Encounter (Signed)
Patient notified and verbalized understanding in regards to medication. Appt also has been made

## 2020-09-26 NOTE — Telephone Encounter (Signed)
Pt returned call from [place].

## 2020-10-25 ENCOUNTER — Other Ambulatory Visit: Payer: Self-pay | Admitting: Internal Medicine

## 2020-11-17 ENCOUNTER — Other Ambulatory Visit: Payer: Self-pay

## 2020-11-18 ENCOUNTER — Ambulatory Visit (INDEPENDENT_AMBULATORY_CARE_PROVIDER_SITE_OTHER): Payer: 59 | Admitting: Nurse Practitioner

## 2020-11-18 ENCOUNTER — Encounter: Payer: Self-pay | Admitting: Nurse Practitioner

## 2020-11-18 VITALS — BP 104/60 | HR 88 | Temp 97.1°F | Ht 61.0 in | Wt 151.4 lb

## 2020-11-18 DIAGNOSIS — E1165 Type 2 diabetes mellitus with hyperglycemia: Secondary | ICD-10-CM

## 2020-11-18 DIAGNOSIS — Z13220 Encounter for screening for lipoid disorders: Secondary | ICD-10-CM | POA: Diagnosis not present

## 2020-11-18 DIAGNOSIS — E559 Vitamin D deficiency, unspecified: Secondary | ICD-10-CM

## 2020-11-18 DIAGNOSIS — Z136 Encounter for screening for cardiovascular disorders: Secondary | ICD-10-CM

## 2020-11-18 DIAGNOSIS — R748 Abnormal levels of other serum enzymes: Secondary | ICD-10-CM

## 2020-11-18 DIAGNOSIS — N1831 Chronic kidney disease, stage 3a: Secondary | ICD-10-CM

## 2020-11-18 DIAGNOSIS — D509 Iron deficiency anemia, unspecified: Secondary | ICD-10-CM

## 2020-11-18 DIAGNOSIS — Z794 Long term (current) use of insulin: Secondary | ICD-10-CM | POA: Diagnosis not present

## 2020-11-18 LAB — HEPATIC FUNCTION PANEL
ALT: 10 U/L (ref 0–35)
AST: 11 U/L (ref 0–37)
Albumin: 4.2 g/dL (ref 3.5–5.2)
Alkaline Phosphatase: 102 U/L (ref 39–117)
Bilirubin, Direct: 0.1 mg/dL (ref 0.0–0.3)
Total Bilirubin: 0.4 mg/dL (ref 0.2–1.2)
Total Protein: 7 g/dL (ref 6.0–8.3)

## 2020-11-18 LAB — MICROALBUMIN / CREATININE URINE RATIO
Creatinine,U: 110.1 mg/dL
Microalb Creat Ratio: 0.9 mg/g (ref 0.0–30.0)
Microalb, Ur: 1 mg/dL (ref 0.0–1.9)

## 2020-11-18 LAB — CBC WITH DIFFERENTIAL/PLATELET
Basophils Absolute: 0.1 10*3/uL (ref 0.0–0.1)
Basophils Relative: 1.3 % (ref 0.0–3.0)
Eosinophils Absolute: 0 10*3/uL (ref 0.0–0.7)
Eosinophils Relative: 0.6 % (ref 0.0–5.0)
HCT: 29.1 % — ABNORMAL LOW (ref 36.0–46.0)
Hemoglobin: 9.1 g/dL — ABNORMAL LOW (ref 12.0–15.0)
Lymphocytes Relative: 27.5 % (ref 12.0–46.0)
Lymphs Abs: 1.5 10*3/uL (ref 0.7–4.0)
MCHC: 31.4 g/dL (ref 30.0–36.0)
MCV: 67.4 fl — ABNORMAL LOW (ref 78.0–100.0)
Monocytes Absolute: 0.4 10*3/uL (ref 0.1–1.0)
Monocytes Relative: 7.8 % (ref 3.0–12.0)
Neutro Abs: 3.4 10*3/uL (ref 1.4–7.7)
Neutrophils Relative %: 62.8 % (ref 43.0–77.0)
Platelets: 275 10*3/uL (ref 150.0–400.0)
RBC: 4.31 Mil/uL (ref 3.87–5.11)
RDW: 17.2 % — ABNORMAL HIGH (ref 11.5–15.5)
WBC: 5.4 10*3/uL (ref 4.0–10.5)

## 2020-11-18 LAB — BASIC METABOLIC PANEL
BUN: 19 mg/dL (ref 6–23)
CO2: 27 mEq/L (ref 19–32)
Calcium: 9.4 mg/dL (ref 8.4–10.5)
Chloride: 106 mEq/L (ref 96–112)
Creatinine, Ser: 1.16 mg/dL (ref 0.40–1.20)
GFR: 50.9 mL/min — ABNORMAL LOW (ref >60.00)
Glucose, Bld: 122 mg/dL — ABNORMAL HIGH (ref 70–99)
Potassium: 4.7 mEq/L (ref 3.5–5.1)
Sodium: 140 mEq/L (ref 135–145)

## 2020-11-18 LAB — LIPID PANEL
Cholesterol: 133 mg/dL (ref 0–200)
HDL: 48.8 mg/dL (ref >39.00)
LDL Cholesterol: 68 mg/dL (ref 0–99)
NonHDL: 83.76
Total CHOL/HDL Ratio: 3
Triglycerides: 81 mg/dL (ref 0.0–149.0)
VLDL: 16.2 mg/dL (ref 0.0–40.0)

## 2020-11-18 LAB — HEMOGLOBIN A1C: Hgb A1c MFr Bld: 6.8 % — ABNORMAL HIGH (ref 4.6–6.5)

## 2020-11-18 LAB — FERRITIN: Ferritin: 30 ng/mL (ref 10.0–291.0)

## 2020-11-18 LAB — VITAMIN D 25 HYDROXY (VIT D DEFICIENCY, FRACTURES): VITD: 13.25 ng/mL — ABNORMAL LOW (ref 30.00–100.00)

## 2020-11-18 NOTE — Progress Notes (Signed)
Subjective:  Patient ID: Christine Wilson, female    DOB: 06-27-1961  Age: 60 y.o. MRN: [id number]  CC: Follow-up (1 year f/u on DM/Pt is fasting)  HPI  DM (diabetes mellitus) (HCC) Controlled with diet BP at goal LDL at goal Normal foot exam. CKD with anemia: repeat BMP, cbc, and ferritin Repeat HgbA1c and urine microalbumin Advised to schedule annual eye exam and dental cleaning every 52months  Microcytic anemia Repeat bmp, cbc and ferritin No melena, ABD pain, nausea, weight loss, or change in GU/GI function   Wt Readings from Last 3 Encounters:  11/18/20 151 lb 6.4 oz (68.7 kg)  06/23/20 154 lb 9.6 oz (70.1 kg)  12/09/19 154 lb 9.6 oz (70.1 kg)   BP Readings from Last 3 Encounters:  11/18/20 104/60  06/23/20 125/61  12/09/19 (!) 126/58   Reviewed past Medical, Social and Family history today.  Outpatient Medications Prior to Visit  Medication Sig Dispense Refill  . amLODipine (NORVASC) 10 MG tablet Take 1 tablet (10 mg total) by mouth daily. 30 tablet 11  . Ascorbic Acid (VITAMIN C) 1000 MG tablet Take 1,000 mg by mouth daily.    Marland Kitchen aspirin 81 MG EC tablet Take 1 tablet (81 mg total) by mouth daily. 30 tablet 11  . atorvastatin (LIPITOR) 40 MG tablet TAKE 1 TABLET BY MOUTH EVERY DAY AT 6 PM 90 tablet 0  . BIDIL 20-37.5 MG tablet TAKE (2) TABLETS THREE TIMES DAILY. 180 tablet 5  . BIOTIN FORTE PO Take 1 tablet by mouth daily as needed (takes occassionaly).     . carvedilol (COREG) 6.25 MG tablet TAKE 1 TABLET(6.25 MG) BY MOUTH TWICE DAILY WITH A MEAL 180 tablet 2  . sacubitril-valsartan (ENTRESTO) 97-103 MG Take 1 tablet by mouth 2 (two) times daily. 180 tablet 2  . spironolactone (ALDACTONE) 25 MG tablet TAKE 1 TABLET(25 MG) BY MOUTH DAILY 90 tablet 3   No facility-administered medications prior to visit.    ROS See HPI  Objective:  BP 104/60 (BP Location: Left Arm, Patient Position: Sitting, Cuff Size: Normal)   Pulse 88   Temp (!) 97.1 F (36.2 C) (Temporal)    Ht 5\' 1"  (1.549 m)   Wt 151 lb 6.4 oz (68.7 kg)   SpO2 98%   BMI 28.61 kg/m   Physical Exam Cardiovascular:     Rate and Rhythm: Normal rate and regular rhythm.     Pulses: Normal pulses.          Dorsalis pedis pulses are 2+ on the right side and 2+ on the left side.       Posterior tibial pulses are 2+ on the right side and 2+ on the left side.     Heart sounds: Normal heart sounds.  Pulmonary:     Effort: Pulmonary effort is normal.     Breath sounds: Normal breath sounds.  Musculoskeletal:     Right lower leg: No edema.     Left lower leg: No edema.     Right foot: Normal range of motion. No prominent metatarsal heads.     Left foot: Normal range of motion. No prominent metatarsal heads.  Feet:     Right foot:     Protective Sensation: 2 sites tested. 2 sites sensed.     Skin integrity: Callus and dry skin present. No ulcer, skin breakdown, erythema or fissure.     Toenail Condition: Right toenails are abnormally thick and long. Fungal disease present.    Left foot:  Protective Sensation: 2 sites tested. 2 sites sensed.     Skin integrity: Callus and dry skin present. No ulcer, skin breakdown, erythema or fissure.     Toenail Condition: Left toenails are abnormally thick and long. Fungal disease present. Neurological:     Mental Status: She is alert and oriented to person, place, and time.    Assessment & Plan:  This visit occurred during the SARS-CoV-2 public health emergency.  Safety protocols were in place, including screening questions prior to the visit, additional usage of staff PPE, and extensive cleaning of exam room while observing appropriate contact time as indicated for disinfecting solutions.   [name] was seen today for follow-up.  Diagnoses and all orders for this visit:  Stage 3a chronic kidney disease (HCC) -     CBC with Differential/Platelet -     Basic metabolic panel -     Ferritin -     Vitamin D 1,25 dihydroxy -     Vitamin D (25  hydroxy) -     Vitamin D, Ergocalciferol, (DRISDOL) 1.25 MG (50000 UNIT) CAPS capsule; Take 1 capsule (50,000 Units total) by mouth every 7 (seven) days.  Type 2 diabetes mellitus with hyperglycemia, with long-term current use of insulin (HCC) -     Basic metabolic panel -     Hemoglobin A1c -     Microalbumin / creatinine urine ratio  Microcytic anemia -     CBC with Differential/Platelet -     Basic metabolic panel -     Ferritin  Elevated alkaline phosphatase level -     Hepatic function panel -     Vitamin D 1,25 dihydroxy -     Vitamin D (25 hydroxy)  Encounter for lipid screening for cardiovascular disease -     Lipid panel  Vitamin D deficiency -     Vitamin D, Ergocalciferol, (DRISDOL) 1.25 MG (50000 UNIT) CAPS capsule; Take 1 capsule (50,000 Units total) by mouth every 7 (seven) days.   Problem List Items Addressed This Visit      Endocrine   DM (diabetes mellitus) (HCC)    Controlled with diet BP at goal LDL at goal Normal foot exam. CKD with anemia: repeat BMP, cbc, and ferritin Repeat HgbA1c and urine microalbumin Advised to schedule annual eye exam and dental cleaning every 83months      Relevant Orders   Basic metabolic panel (Completed)   Hemoglobin A1c (Completed)   Microalbumin / creatinine urine ratio (Completed)     Genitourinary   Stage 3a chronic kidney disease (HCC) - Primary   Relevant Medications   Vitamin D, Ergocalciferol, (DRISDOL) 1.25 MG (50000 UNIT) CAPS capsule   Other Relevant Orders   CBC with Differential/Platelet (Completed)   Basic metabolic panel (Completed)   Ferritin (Completed)   Vitamin D 1,25 dihydroxy   Vitamin D (25 hydroxy) (Completed)     Other   Elevated alkaline phosphatase level   Relevant Orders   Hepatic function panel (Completed)   Vitamin D 1,25 dihydroxy   Vitamin D (25 hydroxy) (Completed)   Microcytic anemia    Repeat bmp, cbc and ferritin No melena, ABD pain, nausea, weight loss, or change in GU/GI  function       Relevant Orders   CBC with Differential/Platelet (Completed)   Basic metabolic panel (Completed)   Ferritin (Completed)   Vitamin D deficiency   Relevant Medications   Vitamin D, Ergocalciferol, (DRISDOL) 1.25 MG (50000 UNIT) CAPS capsule    Other Visit  Diagnoses    Encounter for lipid screening for cardiovascular disease       Relevant Orders   Lipid panel (Completed)      Follow-up: Return in about 3 months (around 02/17/2021) for DM.  Alysia Penna, NP

## 2020-11-18 NOTE — Assessment & Plan Note (Signed)
Controlled with diet BP at goal LDL at goal Normal foot exam. CKD with anemia: repeat BMP, cbc, and ferritin Repeat HgbA1c and urine microalbumin Advised to schedule annual eye exam and dental cleaning every 91months

## 2020-11-18 NOTE — Patient Instructions (Signed)
Go to lab for blood draw and urine collection  Schedule appt for diabetic eye exam. Have report faxed to me.

## 2020-11-18 NOTE — Assessment & Plan Note (Signed)
Repeat bmp, cbc and ferritin No melena, ABD pain, nausea, weight loss, or change in GU/GI function

## 2020-11-25 DIAGNOSIS — E559 Vitamin D deficiency, unspecified: Secondary | ICD-10-CM | POA: Insufficient documentation

## 2020-11-25 MED ORDER — VITAMIN D (ERGOCALCIFEROL) 1.25 MG (50000 UNIT) PO CAPS: 50000.0000 [IU] | ORAL_CAPSULE | ORAL | 0 refills | Status: DC

## 2020-12-12 ENCOUNTER — Other Ambulatory Visit: Payer: Self-pay | Admitting: Cardiovascular Disease

## 2021-02-17 ENCOUNTER — Other Ambulatory Visit: Payer: Self-pay | Admitting: Nurse Practitioner

## 2021-02-17 DIAGNOSIS — N1831 Chronic kidney disease, stage 3a: Secondary | ICD-10-CM

## 2021-02-17 DIAGNOSIS — E559 Vitamin D deficiency, unspecified: Secondary | ICD-10-CM

## 2021-02-20 NOTE — Telephone Encounter (Signed)
Chart supports refill Last ov 11/18/2020 Last refill 11/26/2020

## 2021-02-24 ENCOUNTER — Telehealth: Payer: Self-pay | Admitting: Cardiovascular Disease

## 2021-02-24 MED ORDER — AMLODIPINE BESYLATE 10 MG PO TABS: 10.0000 mg | ORAL_TABLET | Freq: Every day | ORAL | 0 refills | Status: DC

## 2021-02-24 NOTE — Telephone Encounter (Signed)
  *  STAT* If patient is at the pharmacy, call can be transferred to refill team.   1. Which medications need to be refilled? (please list name of each medication and dose if known) amLODipine (NORVASC) 10 MG tablet  2. Which pharmacy/location (including street and city if local pharmacy) is medication to be sent to? [facility] #[id number] - [place], [place] - [address] AT [address]  3. Do they need a 30 day or 90 day supply? 90 days  Pt is out of meds, needs refill today

## 2021-03-02 ENCOUNTER — Other Ambulatory Visit: Payer: Self-pay | Admitting: Cardiovascular Disease

## 2021-03-14 ENCOUNTER — Other Ambulatory Visit: Payer: Self-pay | Admitting: Cardiovascular Disease

## 2021-03-24 ENCOUNTER — Telehealth: Payer: Self-pay | Admitting: Cardiovascular Disease

## 2021-03-24 DIAGNOSIS — I5041 Acute combined systolic (congestive) and diastolic (congestive) heart failure: Secondary | ICD-10-CM

## 2021-03-24 DIAGNOSIS — I160 Hypertensive urgency: Secondary | ICD-10-CM

## 2021-03-24 MED ORDER — ENTRESTO 97-103 MG PO TABS: 1.0000 | ORAL_TABLET | Freq: Two times a day (BID) | ORAL | 2 refills | Status: DC

## 2021-03-24 NOTE — Telephone Encounter (Signed)
*  STAT* If patient is at the pharmacy, call can be transferred to refill team.   1. Which medications need to be refilled? (please list name of each medication and dose if known) sacubitril-valsartan (ENTRESTO) 97-103 MG  2. Which pharmacy/location (including street and city if local pharmacy) is medication to be sent to? [facility] #[id number] - [place], [place] - [address] AT [facility] [address]  3. Do they need a 30 day or 90 day supply? 90 day   Patient is completely out of medication.

## 2021-05-12 ENCOUNTER — Other Ambulatory Visit: Payer: Self-pay | Admitting: Nurse Practitioner

## 2021-05-12 DIAGNOSIS — E559 Vitamin D deficiency, unspecified: Secondary | ICD-10-CM

## 2021-05-12 DIAGNOSIS — N1831 Chronic kidney disease, stage 3a: Secondary | ICD-10-CM

## 2021-05-17 ENCOUNTER — Other Ambulatory Visit: Payer: Self-pay

## 2021-05-17 DIAGNOSIS — N1831 Chronic kidney disease, stage 3a: Secondary | ICD-10-CM

## 2021-05-17 DIAGNOSIS — E559 Vitamin D deficiency, unspecified: Secondary | ICD-10-CM

## 2021-05-17 MED ORDER — VITAMIN D (ERGOCALCIFEROL) 1.25 MG (50000 UNIT) PO CAPS: 50000.0000 [IU] | ORAL_CAPSULE | ORAL | 0 refills | Status: DC

## 2021-05-17 NOTE — Telephone Encounter (Signed)
Pharmacy medication refill request faxed in.  Chart supports Rx

## 2021-05-21 ENCOUNTER — Other Ambulatory Visit: Payer: Self-pay | Admitting: Nurse Practitioner

## 2021-05-21 DIAGNOSIS — N1831 Chronic kidney disease, stage 3a: Secondary | ICD-10-CM

## 2021-05-21 DIAGNOSIS — E559 Vitamin D deficiency, unspecified: Secondary | ICD-10-CM

## 2021-05-23 ENCOUNTER — Other Ambulatory Visit: Payer: Self-pay | Admitting: Cardiovascular Disease

## 2021-05-23 NOTE — Telephone Encounter (Signed)
Rx(s) sent to pharmacy electronically.  

## 2021-06-10 ENCOUNTER — Other Ambulatory Visit: Payer: Self-pay | Admitting: Cardiovascular Disease

## 2021-06-21 ENCOUNTER — Other Ambulatory Visit: Payer: Self-pay | Admitting: Nurse Practitioner

## 2021-06-21 DIAGNOSIS — N1831 Chronic kidney disease, stage 3a: Secondary | ICD-10-CM

## 2021-06-21 DIAGNOSIS — E559 Vitamin D deficiency, unspecified: Secondary | ICD-10-CM

## 2021-06-21 NOTE — Telephone Encounter (Signed)
Chart supports Rx LVM for patient to scheduled f/u appointment

## 2021-06-22 NOTE — Telephone Encounter (Signed)
Duplicate Already been filled.

## 2021-06-26 ENCOUNTER — Encounter (HOSPITAL_BASED_OUTPATIENT_CLINIC_OR_DEPARTMENT_OTHER): Payer: Self-pay | Admitting: Cardiovascular Disease

## 2021-06-26 ENCOUNTER — Other Ambulatory Visit: Payer: Self-pay

## 2021-06-26 ENCOUNTER — Ambulatory Visit (INDEPENDENT_AMBULATORY_CARE_PROVIDER_SITE_OTHER): Payer: 59 | Admitting: Cardiovascular Disease

## 2021-06-26 VITALS — BP 110/60 | HR 72 | Ht 61.0 in | Wt 154.2 lb

## 2021-06-26 DIAGNOSIS — E1169 Type 2 diabetes mellitus with other specified complication: Secondary | ICD-10-CM | POA: Insufficient documentation

## 2021-06-26 DIAGNOSIS — Z79899 Other long term (current) drug therapy: Secondary | ICD-10-CM | POA: Diagnosis not present

## 2021-06-26 DIAGNOSIS — E7800 Pure hypercholesterolemia, unspecified: Secondary | ICD-10-CM | POA: Diagnosis not present

## 2021-06-26 DIAGNOSIS — I2510 Atherosclerotic heart disease of native coronary artery without angina pectoris: Secondary | ICD-10-CM

## 2021-06-26 DIAGNOSIS — I5032 Chronic diastolic (congestive) heart failure: Secondary | ICD-10-CM | POA: Diagnosis not present

## 2021-06-26 DIAGNOSIS — R0609 Other forms of dyspnea: Secondary | ICD-10-CM | POA: Diagnosis not present

## 2021-06-26 HISTORY — DX: Pure hypercholesterolemia, unspecified: E78.00

## 2021-06-26 HISTORY — DX: Atherosclerotic heart disease of native coronary artery without angina pectoris: I25.10

## 2021-06-26 MED ORDER — AMLODIPINE BESYLATE 5 MG PO TABS: 5.0000 mg | ORAL_TABLET | Freq: Every day | ORAL | 3 refills | Status: DC

## 2021-06-26 NOTE — Assessment & Plan Note (Signed)
LVEF was reduced to 15-20%.  Normalized with GDMT.  She is euvolemic and doing well.  Continue Entresto, carvedilol, Bidil, spironolactone.

## 2021-06-26 NOTE — Assessment & Plan Note (Signed)
LDL goal less than 70.  Continue atorvastatin 40 mg daily.  Check fasting lipids and CMP today.

## 2021-06-26 NOTE — Assessment & Plan Note (Signed)
60% LAD lesion on cath in 2017.  She is medically managed and has no symptoms.  Continue aspirin, atorvastatin, and carvedilol.

## 2021-06-26 NOTE — Progress Notes (Signed)
Cardiology Office Note   Date:  06/26/2021   ID:  Christine Wilson, DOB Sep 28, 1960, MRN [id number]  PCP:  Flossie Buffy, NP  Cardiologist:   Skeet Latch, MD   No chief complaint on file.    History of Present Illness: Christine Wilson is a 60 y.o. female chronic diastolic heart failure, hypertension, nonobstructive CAD who presents for follow up. She was previously a patient of Dr. Haroldine Laws.  She developed acute systolic and diastolic heart failure (LVEF 15 to 20%) in November 2017.  This was thought to be due to hypertensive cardiomyopathy.  Her blood pressure was 205/120.  Chest CT-a was negative for pulmonary embolism.  She had a right and left heart catheterization at that time that revealed moderate, nonobstructive CAD with 60% LAD lesion.  Cardiac MRI revealed diffuse hypokinesis with LVEF 22% and normal RV function.  There is no delayed enhancement.  She was started on Entresto, spironolactone and digoxin.  Over time to following up with the heart failure clinic her ejection fraction has normalized.  Most recently she had an echo 08/2018 that revealed LVEF 60 to 65% with grade 1 diastolic dysfunction.  RV function was normal.  She last saw Dr. Jeffie Pollock on 07/2018 and her blood pressure was poorly controlled so carvedilol was increased.  At her last appointment her blood pressure was poorly controlled. Her spironolactone was restarted and her blood pressure improved. Today, she has been doing well. Occasionally, she will become lightheaded, especially after bending over and standing back up. She records her blood pressure at home and denies any outstanding low or high readings. She endorses occasional shortness of breath when she is rushing or moving too quickly. The shortness of breath has been consistent but has not worsened. She stays active by walking daily. She wants to start riding her bike but is held back by low motivation. She denies any palpitations, chest pain, headaches,  syncope, orthopnea, PND, or lower extremity edema.  Past Medical History:  Diagnosis Date   CAD in native artery 06/26/2021   CHF (congestive heart failure) ([place]) 05/31/2016   cardiologist at [facility]   Chronic diastolic heart failure ([place]) 06/23/2020   Hypertension    Pure hypercholesterolemia 06/26/2021    Past Surgical History:  Procedure Laterality Date   CARDIAC CATHETERIZATION N/A 06/04/2016   Procedure: Right/Left Heart Cath and Coronary Angiography;  Surgeon: Jolaine Artist, MD;  Location: [facility];  Service: Cardiovascular;  Laterality: N/A;   CESAREAN SECTION       Current Outpatient Medications  Medication Sig Dispense Refill   Ascorbic Acid (VITAMIN C) 1000 MG tablet Take 1,000 mg by mouth daily.     aspirin 81 MG EC tablet Take 1 tablet (81 mg total) by mouth daily. 30 tablet 11   atorvastatin (LIPITOR) 40 MG tablet TAKE 1 TABLET BY MOUTH EVERY DAY AT 6 PM 90 tablet 1   BIOTIN FORTE PO Take 1 tablet by mouth daily as needed (takes occassionaly).      carvedilol (COREG) 6.25 MG tablet TAKE 1 TABLET(6.25 MG) BY MOUTH TWICE DAILY WITH A MEAL 180 tablet 2   isosorbide-hydrALAZINE (BIDIL) 20-37.5 MG tablet TAKE (2) TABLETS THREE TIMES DAILY. 180 tablet 0   sacubitril-valsartan (ENTRESTO) 97-103 MG Take 1 tablet by mouth 2 (two) times daily. 180 tablet 2   spironolactone (ALDACTONE) 25 MG tablet TAKE 1 TABLET(25 MG) BY MOUTH DAILY 90 tablet 3   Vitamin D, Ergocalciferol, (DRISDOL) 1.25 MG (50000 UNIT) CAPS capsule  TAKE 1 CAPSULE BY MOUTH EVERY 7 DAYS 12 capsule 0   amLODipine (NORVASC) 5 MG tablet Take 1 tablet (5 mg total) by mouth daily. 90 tablet 3   No current facility-administered medications for this visit.    Allergies:   Patient has no known allergies.    Social History:  The patient  reports that she has never smoked. She has never used smokeless tobacco. She reports that she does not drink alcohol and does not use drugs.   Family History:  The  patient's family history includes Breast cancer in her maternal aunt, maternal grandmother, and mother; Cancer in her maternal grandmother and paternal aunt; Diabetes in her brother and mother; Heart disease in her sister; Heart disease (age of onset: 32) in her mother; Hypertension in her maternal grandmother and mother.    ROS:  Please see the history of present illness.   (+) Lightheadedness (+) Shortness of breath All other systems are reviewed and negative.   PHYSICAL EXAM: VS:  BP 110/60   Pulse 72   Ht 5' 1"  (1.549 m)   Wt 154 lb 3.2 oz (69.9 kg)   SpO2 99%   BMI 29.14 kg/m  , BMI Body mass index is 29.14 kg/m. GENERAL:  Well appearing HEENT: Pupils equal round and reactive, fundi not visualized, oral mucosa unremarkable NECK:  No jugular venous distention, waveform within normal limits, carotid upstroke brisk and symmetric, no bruits LUNGS:  Clear to auscultation bilaterally HEART:  RRR.  PMI not displaced or sustained,S1 and S2 within normal limits, no S3, no S4, no clicks, no rubs, no murmurs ABD:  Flat, positive bowel sounds normal in frequency in pitch, no bruits, no rebound, no guarding, no midline pulsatile mass, no hepatomegaly, no splenomegaly EXT:  2 plus pulses throughout, no edema, no cyanosis no clubbing SKIN:  No rashes no nodules NEURO:  Cranial nerves II through XII grossly intact, motor grossly intact throughout PSYCH:  Cognitively intact, oriented to person place and time  EKG:   06/26/21: Sinus rhythm, rate 72 bpm 06/23/20: Sinus rhythm.  Rate 69 bpm. 05/19/19 demonstrates sinus rhythm.  Rate 70 bpm.  Echo 08/27/18: IMPRESSIONS  1. The left ventricle has normal systolic function of 81-85%. The cavity size is normal. There is no increased left ventricular wall thickness. Echo evidence of impaired diastolic relaxation indeterminate filling pressure.  2. The right ventricle has normal systolic function. The cavity in normal in size. There is no increase in right  ventricular wall thickness. Right ventricular systolic pressure is mildly elevated with an estimated pressure of 35.7 mmHg.  3. The mitral valve is normal in structure No evidence of mitral valve stenosis.  4. The aortic valve is tricuspid.  LHC/RHC 06/04/16: Findings:   Ao = 141/85 (105) LV =  141/11 RA = 4 RV = 29/1 PA = 34/12 (21) PCW = 8 Fick cardiac output/index = 2.6/1.6 Thermo CO/CI = 2.9/1.8 PVR = 5.0 WU SVR = 3087 FA sat = 95% PA sat = 42%, 44%   Assessment: 1. 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 Markedly depressed CO with severely elevated SVR 4. Low filling pressures   Recent Labs: 11/18/2020: ALT 10; BUN 19; Creatinine, Ser 1.16; Hemoglobin 9.1; Platelets 275.0; Potassium 4.7; Sodium 140    Lipid Panel    Component Value Date/Time   CHOL 133 11/18/2020 1116   CHOL 157 10/23/2019 1009   TRIG 81.0 11/18/2020 1116  HDL 48.80 11/18/2020 1116   HDL 63 10/23/2019 1009   CHOLHDL 3 11/18/2020 1116   VLDL 16.2 11/18/2020 1116   LDLCALC 68 11/18/2020 1116   LDLCALC 77 10/23/2019 1009      Wt Readings from Last 3 Encounters:  06/26/21 154 lb 3.2 oz (69.9 kg)  11/18/20 151 lb 6.4 oz (68.7 kg)  06/23/20 154 lb 9.6 oz (70.1 kg)      ASSESSMENT AND PLAN:  Chronic diastolic heart failure (HCC) LVEF was reduced to 15-20%.  Normalized with GDMT.  She is euvolemic and doing well.  Continue Entresto, carvedilol, Bidil, spironolactone.    HBP (high blood pressure) Her blood pressure has been running low and she is feeling dizzy with changes in positions.  We will reduce amlodipine to 5 mg.  Continue carvedilol, BiDil, Entresto, and spironolactone.  She will keep tracking her blood pressures at home.  Her goal is less than 130/80.  Dyspnea on exertion Stable.  She is going to work on increasing her exercise to least 150 minutes weekly.  Pure hypercholesterolemia LDL goal less than 70.   Continue atorvastatin 40 mg daily.  Check fasting lipids and CMP today.  CAD in native artery 60% LAD lesion on cath in 2017.  She is medically managed and has no symptoms.  Continue aspirin, atorvastatin, and carvedilol.   Current medicines are reviewed at length with the patient today.  The patient does not have concerns regarding medicines.  The following changes have been made:  no change  Labs/ tests ordered today include:   Orders Placed This Encounter  Procedures   CBC   Lipid panel   Comp Met (CMET)   EKG 12-Lead      Disposition: FU with Shamel Galyean C. Oval Linsey, MD, [facility] in 3-4 months (Virtual)  Court Joy Javier,acting as a Education administrator for Skeet Latch, MD.,have documented all relevant documentation on the behalf of Skeet Latch, MD,as directed by  Skeet Latch, MD while in the presence of Skeet Latch, MD.  I, Sterling Oval Linsey, MD have reviewed all documentation for this visit.  The documentation of the exam, diagnosis, procedures, and orders on 06/26/2021 are all accurate and complete.   Signed, Faylene Allerton C. Oval Linsey, MD, [facility]  06/26/2021 8:29 AM    [facility]

## 2021-06-26 NOTE — Patient Instructions (Signed)
Medication Instructions:  DECREASE- Amlodipine 5 mg by mouth daily  *If you need a refill on your cardiac medications before your next appointment, please call your pharmacy*   Lab Work: CBC, CMP and Fasting Lipids Today  If you have labs (blood work) drawn today and your tests are completely normal, you will receive your results only by: MyChart Message (if you have MyChart) OR A paper copy in the mail If you have any lab test that is abnormal or we need to change your treatment, we will call you to review the results.   Testing/Procedures: None Ordered   Follow-Up: At [facility], you and your health needs are our priority.  As part of our continuing mission to provide you with exceptional heart care, we have created designated Provider Care Teams.  These Care Teams include your primary Cardiologist (physician) and Advanced Practice Providers (APPs -  Physician Assistants and Nurse Practitioners) who all work together to provide you with the care you need, when you need it.  We recommend signing up for the patient portal called "MyChart".  Sign up information is provided on this After Visit Summary.  MyChart is used to connect with patients for Virtual Visits (Telemedicine).  Patients are able to view lab/test results, encounter notes, upcoming appointments, etc.  Non-urgent messages can be sent to your provider as well.   To learn more about what you can do with MyChart, go to [email or web address].    Your next appointment:   1 year(s)  The format for your next appointment:   In Person  Provider:   Chilton Si, MD    Other Instructions 2 Months with Edd Fabian or Gillian Shields

## 2021-06-26 NOTE — Assessment & Plan Note (Signed)
Her blood pressure has been running low and she is feeling dizzy with changes in positions.  We will reduce amlodipine to 5 mg.  Continue carvedilol, BiDil, Entresto, and spironolactone.  She will keep tracking her blood pressures at home.  Her goal is less than 130/80.

## 2021-06-26 NOTE — Assessment & Plan Note (Signed)
Stable.  She is going to work on increasing her exercise to least 150 minutes weekly.

## 2021-06-27 LAB — COMPREHENSIVE METABOLIC PANEL
ALT: 15 IU/L (ref 0–32)
AST: 17 IU/L (ref 0–40)
Albumin/Globulin Ratio: 1.7 (ref 1.2–2.2)
Albumin: 4.5 g/dL (ref 3.8–4.8)
Alkaline Phosphatase: 117 IU/L (ref 44–121)
BUN/Creatinine Ratio: 14 (ref 12–28)
BUN: 15 mg/dL (ref 8–27)
Bilirubin Total: <0.2 mg/dL (ref 0.0–1.2)
CO2: 21 mmol/L (ref 20–29)
Calcium: 9.7 mg/dL (ref 8.7–10.3)
Chloride: 104 mmol/L (ref 96–106)
Creatinine, Ser: 1.1 mg/dL — ABNORMAL HIGH (ref 0.57–1.00)
Globulin, Total: 2.7 g/dL (ref 1.5–4.5)
Glucose: 139 mg/dL — ABNORMAL HIGH (ref 70–99)
Potassium: 4.5 mmol/L (ref 3.5–5.2)
Sodium: 140 mmol/L (ref 134–144)
Total Protein: 7.2 g/dL (ref 6.0–8.5)
eGFR: 57 mL/min/{1.73_m2} — ABNORMAL LOW (ref >59)

## 2021-06-27 LAB — LIPID PANEL
Chol/HDL Ratio: 2.4 ratio (ref 0.0–4.4)
Cholesterol, Total: 151 mg/dL (ref 100–199)
HDL: 63 mg/dL (ref >39)
LDL Chol Calc (NIH): 68 mg/dL (ref 0–99)
Triglycerides: 114 mg/dL (ref 0–149)
VLDL Cholesterol Cal: 20 mg/dL (ref 5–40)

## 2021-06-27 LAB — CBC
Hematocrit: 30.8 % — ABNORMAL LOW (ref 34.0–46.6)
Hemoglobin: 9.2 g/dL — ABNORMAL LOW (ref 11.1–15.9)
MCH: 20.8 pg — ABNORMAL LOW (ref 26.6–33.0)
MCHC: 29.9 g/dL — ABNORMAL LOW (ref 31.5–35.7)
MCV: 70 fL — ABNORMAL LOW (ref 79–97)
Platelets: 309 10*3/uL (ref 150–450)
RBC: 4.42 x10E6/uL (ref 3.77–5.28)
RDW: 16 % — ABNORMAL HIGH (ref 11.7–15.4)
WBC: 6.6 10*3/uL (ref 3.4–10.8)

## 2021-07-17 ENCOUNTER — Other Ambulatory Visit: Payer: Self-pay | Admitting: Cardiovascular Disease

## 2021-07-29 ENCOUNTER — Other Ambulatory Visit: Payer: Self-pay | Admitting: Cardiovascular Disease

## 2021-07-31 NOTE — Telephone Encounter (Signed)
Rx request sent to pharmacy.  

## 2021-08-01 ENCOUNTER — Telehealth (HOSPITAL_BASED_OUTPATIENT_CLINIC_OR_DEPARTMENT_OTHER): Payer: Self-pay | Admitting: Cardiovascular Disease

## 2021-08-01 NOTE — Telephone Encounter (Signed)
Could complete prior authorization if one is needed, pt qualifies for copay card for Bidil as well since she has Pharmacist, community.  If copay still remains too high, can split into individual isosorbide and hydralazine components.

## 2021-08-01 NOTE — Telephone Encounter (Signed)
Pt c/o medication issue:  1. Name of Medication: BIDIL 20-37.5 MG tablet  2. How are you currently taking this medication (dosage and times per day)? TAKE TWO TABLETS THREE TIMES DAILY  3. Are you having a reaction (difficulty breathing--STAT)? no  4. What is your medication issue? Price of this medication went up and insurance and is not covering it all. Patient is unable to pay for the medication. Calling to see if there something else she can take. Please advise

## 2021-08-01 NOTE — Telephone Encounter (Signed)
Medication assistance please

## 2021-08-01 NOTE — Telephone Encounter (Signed)
Spoke with patient and [employer]  Per pharmacy NB [phone] with insurance and generic $50 Prior to now she was paying less for the NB than she will pay for generic Discussed copay card with patient and generic.  She will get the generic filled but will see what the copay will be for card. If it is less than $50 she will switch back to NB Advised to call if needed anything additional, verbalized understanding

## 2021-08-07 ENCOUNTER — Ambulatory Visit: Payer: 59 | Admitting: Nurse Practitioner

## 2021-08-07 ENCOUNTER — Other Ambulatory Visit: Payer: Self-pay

## 2021-08-07 ENCOUNTER — Encounter: Payer: Self-pay | Admitting: Nurse Practitioner

## 2021-08-07 VITALS — BP 106/60 | HR 86 | Temp 97.4°F | Ht 61.0 in | Wt 151.4 lb

## 2021-08-07 DIAGNOSIS — E1165 Type 2 diabetes mellitus with hyperglycemia: Secondary | ICD-10-CM

## 2021-08-07 DIAGNOSIS — Z23 Encounter for immunization: Secondary | ICD-10-CM | POA: Diagnosis not present

## 2021-08-07 DIAGNOSIS — E559 Vitamin D deficiency, unspecified: Secondary | ICD-10-CM

## 2021-08-07 DIAGNOSIS — N1831 Chronic kidney disease, stage 3a: Secondary | ICD-10-CM | POA: Diagnosis not present

## 2021-08-07 DIAGNOSIS — Z794 Long term (current) use of insulin: Secondary | ICD-10-CM

## 2021-08-07 DIAGNOSIS — D509 Iron deficiency anemia, unspecified: Secondary | ICD-10-CM | POA: Diagnosis not present

## 2021-08-07 DIAGNOSIS — E7800 Pure hypercholesterolemia, unspecified: Secondary | ICD-10-CM

## 2021-08-07 LAB — CBC WITH DIFFERENTIAL/PLATELET
Basophils Absolute: 0 10*3/uL (ref 0.0–0.1)
Basophils Relative: 0.6 % (ref 0.0–3.0)
Eosinophils Absolute: 0.1 10*3/uL (ref 0.0–0.7)
Eosinophils Relative: 1.1 % (ref 0.0–5.0)
HCT: 31.7 % — ABNORMAL LOW (ref 36.0–46.0)
Hemoglobin: 9.6 g/dL — ABNORMAL LOW (ref 12.0–15.0)
Lymphocytes Relative: 25.5 % (ref 12.0–46.0)
Lymphs Abs: 1.3 10*3/uL (ref 0.7–4.0)
MCHC: 30.5 g/dL (ref 30.0–36.0)
MCV: 67.3 fl — ABNORMAL LOW (ref 78.0–100.0)
Monocytes Absolute: 0.5 10*3/uL (ref 0.1–1.0)
Monocytes Relative: 9 % (ref 3.0–12.0)
Neutro Abs: 3.3 10*3/uL (ref 1.4–7.7)
Neutrophils Relative %: 63.8 % (ref 43.0–77.0)
Platelets: 246 10*3/uL (ref 150.0–400.0)
RBC: 4.7 Mil/uL (ref 3.87–5.11)
RDW: 16.6 % — ABNORMAL HIGH (ref 11.5–15.5)
WBC: 5.2 10*3/uL (ref 4.0–10.5)

## 2021-08-07 LAB — BASIC METABOLIC PANEL
BUN: 22 mg/dL (ref 6–23)
CO2: 24 mEq/L (ref 19–32)
Calcium: 9.4 mg/dL (ref 8.4–10.5)
Chloride: 105 mEq/L (ref 96–112)
Creatinine, Ser: 1.22 mg/dL — ABNORMAL HIGH (ref 0.40–1.20)
GFR: 47.67 mL/min — ABNORMAL LOW (ref >60.00)
Glucose, Bld: 117 mg/dL — ABNORMAL HIGH (ref 70–99)
Potassium: 4.5 mEq/L (ref 3.5–5.1)
Sodium: 140 mEq/L (ref 135–145)

## 2021-08-07 LAB — LIPID PANEL
Cholesterol: 143 mg/dL (ref 0–200)
HDL: 53.7 mg/dL (ref >39.00)
LDL Cholesterol: 69 mg/dL (ref 0–99)
NonHDL: 89.15
Total CHOL/HDL Ratio: 3
Triglycerides: 99 mg/dL (ref 0.0–149.0)
VLDL: 19.8 mg/dL (ref 0.0–40.0)

## 2021-08-07 LAB — MICROALBUMIN / CREATININE URINE RATIO
Creatinine,U: 110.8 mg/dL
Microalb Creat Ratio: 0.7 mg/g (ref 0.0–30.0)
Microalb, Ur: 0.8 mg/dL (ref 0.0–1.9)

## 2021-08-07 LAB — HEMOGLOBIN A1C: Hgb A1c MFr Bld: 7.3 % — ABNORMAL HIGH (ref 4.6–6.5)

## 2021-08-07 MED ORDER — BLOOD GLUCOSE MONITOR KIT: PACK | 0 refills | Status: DC

## 2021-08-07 NOTE — Assessment & Plan Note (Signed)
Repeat Vit. D 

## 2021-08-07 NOTE — Assessment & Plan Note (Addendum)
No glucose check at home Last hgbA1c at 6.8% No medication used at this time Advised to schedule diabetic eye exam. Denies any neuropathy. BP Readings from Last 3 Encounters:  08/07/21 106/60  06/26/21 110/60  11/18/20 104/60   Repeat hgbA1c, BMP, and urine microalbumin: Stable renal function Normal lipid panel and urine protein. Increase in HgbA1c: 6.8 to 7.3%: start metformin 500mg  daily Provided glucometer rx and nutritional recommendation F/up in 68months

## 2021-08-07 NOTE — Assessment & Plan Note (Addendum)
Repeat BMP and CBC: stable

## 2021-08-07 NOTE — Patient Instructions (Addendum)
Go to lab for blood draw and urine collection  Schedule annual diabetic eye exam. Have report faxed to me.  Check glucose 2-3x/week before breakfast. Bring reading to next office visit.  Diabetes Mellitus and Nutrition, Adult When you have diabetes, or diabetes mellitus, it is very important to have healthy eating habits because your blood sugar (glucose) levels are greatly affected by what you eat and drink. Eating healthy foods in the right amounts, at about the same times every day, can help you: Manage your blood glucose. Lower your risk of heart disease. Improve your blood pressure. Reach or maintain a healthy weight. What can affect my meal plan? Every person with diabetes is different, and each person has different needs for a meal plan. Your health care provider may recommend that you work with a dietitian to make a meal plan that is best for you. Your meal plan may vary depending on factors such as: The calories you need. The medicines you take. Your weight. Your blood glucose, blood pressure, and cholesterol levels. Your activity level. Other health conditions you have, such as heart or kidney disease. How do carbohydrates affect me? Carbohydrates, also called carbs, affect your blood glucose level more than any other type of food. Eating carbs raises the amount of glucose in your blood. It is important to know how many carbs you can safely have in each meal. This is different for every person. Your dietitian can help you calculate how many carbs you should have at each meal and for each snack. How does alcohol affect me? Alcohol can cause a decrease in blood glucose (hypoglycemia), especially if you use insulin or take certain diabetes medicines by mouth. Hypoglycemia can be a life-threatening condition. Symptoms of hypoglycemia, such as sleepiness, dizziness, and confusion, are similar to symptoms of having too much alcohol. Do not drink alcohol if: Your health care provider  tells you not to drink. You are pregnant, may be pregnant, or are planning to become pregnant. If you drink alcohol: Limit how much you have to: 0-1 drink a day for women. 0-2 drinks a day for men. Know how much alcohol is in your drink. In the [place] one drink equals one 12 oz bottle of beer (355 mL), one 5 oz glass of wine (148 mL), or one 1 oz glass of hard liquor (44 mL). Keep yourself hydrated with water, diet soda, or unsweetened iced tea. Keep in mind that regular soda, juice, and other mixers may contain a lot of sugar and must be counted as carbs. What are tips for following this plan? Reading food labels Start by checking the serving size on the Nutrition Facts label of packaged foods and drinks. The number of calories and the amount of carbs, fats, and other nutrients listed on the label are based on one serving of the item. Many items contain more than one serving per package. Check the total grams (g) of carbs in one serving. Check the number of grams of saturated fats and trans fats in one serving. Choose foods that have a low amount or none of these fats. Check the number of milligrams (mg) of salt (sodium) in one serving. Most people should limit total sodium intake to less than 2,300 mg per day. Always check the nutrition information of foods labeled as "low-fat" or "nonfat." These foods may be higher in added sugar or refined carbs and should be avoided. Talk to your dietitian to identify your daily goals for nutrients listed on the label. Shopping  Avoid buying canned, pre-made, or processed foods. These foods tend to be high in fat, sodium, and added sugar. Shop around the outside edge of the grocery store. This is where you will most often find fresh fruits and vegetables, bulk grains, fresh meats, and fresh dairy products. Cooking Use low-heat cooking methods, such as baking, instead of high-heat cooking methods, such as deep frying. Cook using healthy oils, such as olive,  canola, or sunflower oil. Avoid cooking with butter, cream, or high-fat meats. Meal planning Eat meals and snacks regularly, preferably at the same times every day. Avoid going long periods of time without eating. Eat foods that are high in fiber, such as fresh fruits, vegetables, beans, and whole grains. Eat 4-6 oz (112-168 g) of lean protein each day, such as lean meat, chicken, fish, eggs, or tofu. One ounce (oz) (28 g) of lean protein is equal to: 1 oz (28 g) of meat, chicken, or fish. 1 egg.  cup (62 g) of tofu. Eat some foods each day that contain healthy fats, such as avocado, nuts, seeds, and fish. What foods should I eat? Fruits Berries. Apples. Oranges. Peaches. Apricots. Plums. Grapes. Mangoes. Papayas. Pomegranates. Kiwi. Cherries. Vegetables Leafy greens, including lettuce, spinach, kale, chard, collard greens, mustard greens, and cabbage. Beets. Cauliflower. Broccoli. Carrots. Green beans. Tomatoes. Peppers. Onions. Cucumbers. Brussels sprouts. Grains Whole grains, such as whole-wheat or whole-grain bread, crackers, tortillas, cereal, and pasta. Unsweetened oatmeal. Quinoa. Brown or wild rice. Meats and other proteins Seafood. Poultry without skin. Lean cuts of poultry and beef. Tofu. Nuts. Seeds. Dairy Low-fat or fat-free dairy products such as milk, yogurt, and cheese. The items listed above may not be a complete list of foods and beverages you can eat and drink. Contact a dietitian for more information. What foods should I avoid? Fruits Fruits canned with syrup. Vegetables Canned vegetables. Frozen vegetables with butter or cream sauce. Grains Refined white flour and flour products such as bread, pasta, snack foods, and cereals. Avoid all processed foods. Meats and other proteins Fatty cuts of meat. Poultry with skin. Breaded or fried meats. Processed meat. Avoid saturated fats. Dairy Full-fat yogurt, cheese, or milk. Beverages Sweetened drinks, such as soda or  iced tea. The items listed above may not be a complete list of foods and beverages you should avoid. Contact a dietitian for more information. Questions to ask a health care provider Do I need to meet with a certified diabetes care and education specialist? Do I need to meet with a dietitian? What number can I call if I have questions? When are the best times to check my blood glucose? Where to find more information: American Diabetes Association: diabetes.org Academy of Nutrition and Dietetics: eatright.[employer] of Diabetes and Digestive and Kidney Diseases: StageSync.si Association of Diabetes Care & Education Specialists: diabeteseducator.org Summary It is important to have healthy eating habits because your blood sugar (glucose) levels are greatly affected by what you eat and drink. It is important to use alcohol carefully. A healthy meal plan will help you manage your blood glucose and lower your risk of heart disease. Your health care provider may recommend that you work with a dietitian to make a meal plan that is best for you. This information is not intended to replace advice given to you by your health care provider. Make sure you discuss any questions you have with your health care provider. Document Revised: 02/10/2020 Document Reviewed: 02/10/2020 Elsevier Patient Education  2022 [employer].

## 2021-08-07 NOTE — Assessment & Plan Note (Addendum)
Normal ferritin Denies any GI and GU bleed Last colonoscopy 2018: normal Possibly due to CKD Repeat bmp and cbc today

## 2021-08-07 NOTE — Progress Notes (Signed)
Subjective:  Patient ID: Christine Wilson, female    DOB: [birth year]  Age: 61 y.o. MRN: [id number]  CC: Follow-up (Follow up on DM. Pt is fasting today. )  HPI  DM (diabetes mellitus) ([place]) No glucose check at home Last hgbA1c at 6.8% No medication used at this time Advised to schedule diabetic eye exam. Denies any neuropathy. BP Readings from Last 3 Encounters:  08/07/21 106/60  06/26/21 110/60  11/18/20 104/60   Repeat hgbA1c, BMP, and urine microalbumin: Stable renal function Normal lipid panel and urine protein. Increase in HgbA1c: 6.8 to 7.3%: start metformin 551m daily Provided glucometer rx and nutritional recommendation F/up in 343month Vitamin D deficiency Repeat Vit. D  Stage 3a chronic kidney disease (HCC) Repeat BMP and CBC: stable  Microcytic anemia Normal ferritin Denies any GI and GU bleed Last colonoscopy 2018: normal Possibly due to CKD Repeat bmp and cbc today  Pure hypercholesterolemia LDL at goal with atorvastatin  Reviewed past Medical, Social and Family history today.  Outpatient Medications Prior to Visit  Medication Sig Dispense Refill   amLODipine (NORVASC) 5 MG tablet Take 1 tablet (5 mg total) by mouth daily. 90 tablet 3   Ascorbic Acid (VITAMIN C) 1000 MG tablet Take 1,000 mg by mouth daily.     aspirin 81 MG EC tablet Take 1 tablet (81 mg total) by mouth daily. 30 tablet 11   atorvastatin (LIPITOR) 40 MG tablet TAKE 1 TABLET BY MOUTH EVERY DAY AT 6 PM 90 tablet 1   BIDIL 20-37.5 MG tablet TAKE TWO TABLETS THREE TIMES DAILY 180 tablet 6   BIOTIN FORTE PO Take 1 tablet by mouth daily as needed (takes occassionaly).      carvedilol (COREG) 6.25 MG tablet TAKE 1 TABLET(6.25 MG) BY MOUTH TWICE DAILY WITH A MEAL 180 tablet 3   sacubitril-valsartan (ENTRESTO) 97-103 MG Take 1 tablet by mouth 2 (two) times daily. 180 tablet 2   spironolactone (ALDACTONE) 25 MG tablet TAKE 1 TABLET(25 MG) BY MOUTH DAILY 90 tablet 3   Vitamin D, Ergocalciferol,  (DRISDOL) 1.25 MG (50000 UNIT) CAPS capsule TAKE 1 CAPSULE BY MOUTH EVERY 7 DAYS 12 capsule 0   No facility-administered medications prior to visit.    ROS See HPI  Objective:  BP 106/60 (BP Location: Left Arm, Patient Position: Sitting, Cuff Size: Normal)    Pulse 86    Temp (!) 97.4 F (36.3 C) (Temporal)    Ht 5' 1"  (1.549 m)    Wt 151 lb 6.4 oz (68.7 kg)    SpO2 96%    BMI 28.61 kg/m   Physical Exam Vitals reviewed.  Cardiovascular:     Rate and Rhythm: Normal rate and regular rhythm.     Pulses: Normal pulses.     Heart sounds: Normal heart sounds.  Pulmonary:     Effort: Pulmonary effort is normal.     Breath sounds: Normal breath sounds.  Musculoskeletal:     Right lower leg: No edema.     Left lower leg: No edema.  Neurological:     Mental Status: She is alert and oriented to person, place, and time.   Assessment & Plan:  This visit occurred during the SARS-CoV-2 public health emergency.  Safety protocols were in place, including screening questions prior to the visit, additional usage of staff PPE, and extensive cleaning of exam room while observing appropriate contact time as indicated for disinfecting solutions.   Pa[place] seen today for follow-up.  Diagnoses and all  orders for this visit:  Vitamin D deficiency -     Vitamin D 1,25 dihydroxy -     Vitamin D 1,25 dihydroxy  Type 2 diabetes mellitus with hyperglycemia, with long-term current use of insulin (HCC) -     Hemoglobin A1c -     Microalbumin / creatinine urine ratio -     blood glucose meter kit and supplies KIT; Dispense based on patient and insurance preference. Check glucose once a day before breakfast. E11.65 -     metFORMIN (GLUCOPHAGE XR) 500 MG 24 hr tablet; Take 1 tablet (500 mg total) by mouth daily with breakfast.  Stage 3a chronic kidney disease (HCC) -     Basic metabolic panel  Microcytic anemia -     CBC with Differential/Platelet  Pure hypercholesterolemia -     Lipid  panel  Need for pneumococcal vaccine -     Pneumococcal conjugate vaccine 20-valent (Prevnar 20)  Flu vaccine need -     Flu Vaccine QUAD High Dose(Fluad)  Other orders -     TIQ- MISLABELED -     PAT ID TIQ DOC   Problem List Items Addressed This Visit       Endocrine   DM (diabetes mellitus) ([place])    No glucose check at home Last hgbA1c at 6.8% No medication used at this time Advised to schedule diabetic eye exam. Denies any neuropathy. BP Readings from Last 3 Encounters:  08/07/21 106/60  06/26/21 110/60  11/18/20 104/60   Repeat hgbA1c, BMP, and urine microalbumin: Stable renal function Normal lipid panel and urine protein. Increase in HgbA1c: 6.8 to 7.3%: start metformin 577m daily Provided glucometer rx and nutritional recommendation F/up in 360month     Relevant Medications   blood glucose meter kit and supplies KIT   metFORMIN (GLUCOPHAGE XR) 500 MG 24 hr tablet   Other Relevant Orders   Hemoglobin A1c (Completed)   Microalbumin / creatinine urine ratio (Completed)     Genitourinary   Stage 3a chronic kidney disease (HCC)    Repeat BMP and CBC: stable      Relevant Orders   Basic metabolic panel (Completed)     Other   Microcytic anemia    Normal ferritin Denies any GI and GU bleed Last colonoscopy 2018: normal Possibly due to CKD Repeat bmp and cbc today      Relevant Orders   CBC with Differential/Platelet (Completed)   Pure hypercholesterolemia    LDL at goal with atorvastatin      Relevant Orders   Lipid panel (Completed)   Vitamin D deficiency - Primary    Repeat Vit. D      Relevant Orders   Vitamin D 1,25 dihydroxy   Vitamin D 1,25 dihydroxy   Other Visit Diagnoses     Need for pneumococcal vaccine       Relevant Orders   Pneumococcal conjugate vaccine 20-valent (Prevnar 20) (Completed)   Flu vaccine need       Relevant Orders   Flu Vaccine QUAD High Dose(Fluad) (Completed)       Follow-up: Return in about 3 months  (around 11/05/2021) for DM and , hyperlipidemia.  ChWilfred LacyNP

## 2021-08-08 LAB — TIQ- MISLABELED
DATE RECEIVED:: 1162023
Test Ordered On Req: 16558

## 2021-08-10 MED ORDER — METFORMIN HCL ER 500 MG PO TB24: 500.0000 mg | ORAL_TABLET | Freq: Every day | ORAL | 5 refills | Status: DC

## 2021-08-10 NOTE — Assessment & Plan Note (Signed)
LDL at goal with atorvastatin 

## 2021-08-13 LAB — PAT ID TIQ DOC: Test Affected: 16558

## 2021-08-13 LAB — VITAMIN D 1,25 DIHYDROXY
Vitamin D 1, 25 (OH)2 Total: 44 pg/mL (ref 18–72)
Vitamin D2 1, 25 (OH)2: 44 pg/mL
Vitamin D3 1, 25 (OH)2: <8 pg/mL

## 2021-08-25 NOTE — Progress Notes (Signed)
Office Visit    Patient Name: Christine Wilson Date of Encounter: 08/28/2021  PCP:  Flossie Buffy, NP   [place]  Cardiologist:  Skeet Latch, MD  Advanced Practice Provider:  No care team member to display Electrophysiologist:  None   HPI    Christine Wilson is a 61 y.o. female with a hx of chronic diastolic heart failure, hypertension, nonobstructive CAD, hypertension, hyperlipidemia presents today for follow-up appointment.   The patient developed acute systolic and diastolic heart failure (LVEF 15 to 20%) in November 2017.  This was thought to be due to hypertensive cardiomyopathy.  Her blood pressure at the time was 205/120.  She had a CT of the chest which was negative for PE.  She had a right and left heart cath at that time which showed moderate, nonobstructive CAD with 60% LAD lesion.  Cardiac MRI revealed diffuse hypokinesis with LVEF 22% and normal RV function.  She was started on Entresto, spironolactone and digoxin.  Over time, with the heart failure clinic her ejection fraction normalized.  She had an echocardiogram 08/2018 that revealed LVEF 60 to 65% with grade 1 DD.  She was seen by Dr. Haroldine Laws January 2020 and her blood pressure was poorly controlled so carvedilol was increased.  She was last seen December 2022 and she had been doing well, blood pressure had improved.  Occasionally she did have some lightheadedness when bending over and standing back up.  She did have some shortness of breath but this has been stable.  She denied any palpitations, chest pain, headache, syncope, orthopnea, PND, or lower extremity edema.  Today, she states that her shortness of breath is about the same.  It has been happening for a while.  It does happen with exertion.  She also states that she does occasionally get dizzy when bending over and standing back up.  We reviewed hydration status and I encouraged her to drink 64 ounces of water a day and limit her  caffeine intake.  It does sound like she enjoys soda.  I just asked her to limit her soda intake and increase her water intake.  She also is concerned about the price of her BiDil.  She is satisfied with all her other medications and remains compliant.  Her recent lipid panel from 08/07/2021 showed LDL 69, triglycerides 99.  She denies any heart palpitations, chest pain, or lower extremity edema.  Reports no chest pain, pressure, or tightness. No edema, orthopnea, PND. Reports no palpitations.    Past Medical History    Past Medical History:  Diagnosis Date   CAD in native artery 06/26/2021   CHF (congestive heart failure) ([place]) 05/31/2016   cardiologist at [facility]   Chronic diastolic heart failure ([place]) 06/23/2020   Hypertension    Pure hypercholesterolemia 06/26/2021   Past Surgical History:  Procedure Laterality Date   CARDIAC CATHETERIZATION N/A 06/04/2016   Procedure: Right/Left Heart Cath and Coronary Angiography;  Surgeon: Jolaine Artist, MD;  Location: [facility];  Service: Cardiovascular;  Laterality: N/A;   CESAREAN SECTION      Allergies  No Known Allergies   EKGs/Labs/Other Studies Reviewed:   The following studies were reviewed today: Echo 08/27/18: IMPRESSIONS  1. The left ventricle has normal systolic function of 64-40%. The cavity size is normal. There is no increased left ventricular wall thickness. Echo evidence of impaired diastolic relaxation indeterminate filling pressure.  2. The right ventricle has normal systolic function. The cavity  in normal in size. There is no increase in right ventricular wall thickness. Right ventricular systolic pressure is mildly elevated with an estimated pressure of 35.7 mmHg.  3. The mitral valve is normal in structure No evidence of mitral valve stenosis.  4. The aortic valve is tricuspid.   LHC/RHC 06/04/16: Findings:   Ao = 141/85 (105) LV =  141/11 RA = 4 RV = 29/1 PA = 34/12 (21) PCW = 8 Fick cardiac  output/index = 2.6/1.6 Thermo CO/CI = 2.9/1.8 PVR = 5.0 WU SVR = 3087 FA sat = 95% PA sat = 42%, 44%    EKG:  EKG is not ordered today.   Recent Labs: 06/26/2021: ALT 15 08/07/2021: BUN 22; Creatinine, Ser 1.22; Hemoglobin 9.6; Platelets 246.0; Potassium 4.5; Sodium 140  Recent Lipid Panel    Component Value Date/Time   CHOL 143 08/07/2021 0938   CHOL 151 06/26/2021 0838   TRIG 99.0 08/07/2021 0938   HDL 53.70 08/07/2021 0938   HDL 63 06/26/2021 0838   CHOLHDL 3 08/07/2021 0938   VLDL 19.8 08/07/2021 0938   LDLCALC 69 08/07/2021 0938   LDLCALC 68 06/26/2021 0838     Home Medications   Current Meds  Medication Sig   amLODipine (NORVASC) 5 MG tablet Take 1 tablet (5 mg total) by mouth daily.   Ascorbic Acid (VITAMIN C) 1000 MG tablet Take 1,000 mg by mouth daily.   aspirin 81 MG EC tablet Take 1 tablet (81 mg total) by mouth daily.   atorvastatin (LIPITOR) 40 MG tablet TAKE 1 TABLET BY MOUTH EVERY DAY AT 6 PM   BIDIL 20-37.5 MG tablet TAKE TWO TABLETS THREE TIMES DAILY   BIOTIN FORTE PO Take 1 tablet by mouth daily as needed (takes occassionaly).    blood glucose meter kit and supplies KIT Dispense based on patient and insurance preference. Check glucose once a day before breakfast. E11.65   carvedilol (COREG) 6.25 MG tablet TAKE 1 TABLET(6.25 MG) BY MOUTH TWICE DAILY WITH A MEAL   metFORMIN (GLUCOPHAGE XR) 500 MG 24 hr tablet Take 1 tablet (500 mg total) by mouth daily with breakfast.   sacubitril-valsartan (ENTRESTO) 97-103 MG Take 1 tablet by mouth 2 (two) times daily.   spironolactone (ALDACTONE) 25 MG tablet TAKE 1 TABLET(25 MG) BY MOUTH DAILY   Vitamin D, Ergocalciferol, (DRISDOL) 1.25 MG (50000 UNIT) CAPS capsule TAKE 1 CAPSULE BY MOUTH EVERY 7 DAYS     Review of Systems      All other systems reviewed and are otherwise negative except as noted above.  Physical Exam    VS:  BP 122/62    Pulse 82    Ht 5' 1" (1.549 m)    Wt 156 lb (70.8 kg)    SpO2 99%    BMI  29.48 kg/m  , BMI Body mass index is 29.48 kg/m.  Wt Readings from Last 3 Encounters:  08/28/21 156 lb (70.8 kg)  08/07/21 151 lb 6.4 oz (68.7 kg)  06/26/21 154 lb 3.2 oz (69.9 kg)     GEN: Well nourished, well developed, in no acute distress. HEENT: normal. Neck: Supple, no JVD, carotid bruits, or masses. Cardiac: RRR, no murmurs, rubs, or gallops. No clubbing, cyanosis, edema.  Radials/PT 2+ and equal bilaterally.  Respiratory:  Respirations regular and unlabored, clear to auscultation bilaterally. GI: Soft, nontender, nondistended. MS: No deformity or atrophy. Skin: Warm and dry, no rash. Neuro:  Strength and sensation are intact. Psych: Normal affect.  Assessment & Plan  Chronic diastolic heart failure/DOE -Last echocardiogram 2020, EF 60-65%, impaired diastolic relaxation, no valvular dysfunction -Would repeat echocardiogram with ongoing symptoms of SOB with exertion.  -She is worried about insurance coverage since she just switched to a new plan  2. Hypertension -Well controlled today -Well controlled at home -She does sound like she has occasional orthostatic hypotension. We discussed increasing her water intake and decreasing caffeine.   3. Hyperlipidemia -Well controlled on current medication regimen -Recent lipid panel 08/07/2021 showed total cholesterol 143, HDL 53, LDL 69, triglycerides 99  4. CAD in native artery -No chest pain -Continue GDMT: Norvasc 5 mg, aspirin 81 mg, Lipitor 40 mg, Coreg 6.25 mg, BiDil 20/37.5 mg, Entresto 97/103 mg, and spironolactone 25 mg -She is requesting patient assistance with her BiDil. We have reached out to pharmacy regarding this request    Disposition: Follow up 3 months with Skeet Latch, MD or APP.  Signed, Elgie Collard, PA-C 08/28/2021, 9:35 AM [facility]

## 2021-08-28 ENCOUNTER — Encounter (HOSPITAL_BASED_OUTPATIENT_CLINIC_OR_DEPARTMENT_OTHER): Payer: Self-pay | Admitting: Physician Assistant

## 2021-08-28 ENCOUNTER — Ambulatory Visit (INDEPENDENT_AMBULATORY_CARE_PROVIDER_SITE_OTHER): Payer: 59 | Admitting: Physician Assistant

## 2021-08-28 ENCOUNTER — Other Ambulatory Visit: Payer: Self-pay

## 2021-08-28 VITALS — BP 122/62 | HR 82 | Ht 61.0 in | Wt 156.0 lb

## 2021-08-28 DIAGNOSIS — E785 Hyperlipidemia, unspecified: Secondary | ICD-10-CM

## 2021-08-28 DIAGNOSIS — I10 Essential (primary) hypertension: Secondary | ICD-10-CM | POA: Diagnosis not present

## 2021-08-28 DIAGNOSIS — R0609 Other forms of dyspnea: Secondary | ICD-10-CM | POA: Diagnosis not present

## 2021-08-28 DIAGNOSIS — I2510 Atherosclerotic heart disease of native coronary artery without angina pectoris: Secondary | ICD-10-CM | POA: Diagnosis not present

## 2021-08-28 DIAGNOSIS — I5032 Chronic diastolic (congestive) heart failure: Secondary | ICD-10-CM

## 2021-08-28 NOTE — Patient Instructions (Signed)
Medication Instructions:  Your Physician recommend you continue on your current medication as directed.    *If you need a refill on your cardiac medications before your next appointment, please call your pharmacy*   Lab Work: None ordered today   Testing/Procedures: We would like to get you scheduled for an Echocardiogram! Please contact your insurance for pricing and let us know if you would like to proceed! We will not schedule this yet until we hear from you!   Your physician has requested that you have an echocardiogram. Echocardiography is a painless test that uses sound waves to create images of your heart. It provides your doctor with information about the size and shape of your heart and how well your hearts chambers and valves are working. This procedure takes approximately one hour. There are no restrictions for this procedure. [address]    Follow-Up: At [facility], you and your health needs are our priority.  As part of our continuing mission to provide you with exceptional heart care, we have created designated Provider Care Teams.  These Care Teams include your primary Cardiologist (physician) and Advanced Practice Providers (APPs -  Physician Assistants and Nurse Practitioners) who all work together to provide you with the care you need, when you need it.  We recommend signing up for the patient portal called "MyChart".  Sign up information is provided on this After Visit Summary.  MyChart is used to connect with patients for Virtual Visits (Telemedicine).  Patients are able to view lab/test results, encounter notes, upcoming appointments, etc.  Non-urgent messages can be sent to your provider as well.   To learn more about what you can do with MyChart, go to [email or web address].    Your next appointment:   3 month(s) depending on when ECHO is done!   The format for your next appointment:   In Person  Provider:   Chilton Si, MD,  Gillian Shields, NP, or Edd Fabian, NP

## 2021-08-29 ENCOUNTER — Telehealth: Payer: Self-pay

## 2021-08-29 NOTE — Telephone Encounter (Signed)
Lmom pt that we have bidil copay card approved:

## 2021-09-04 ENCOUNTER — Other Ambulatory Visit: Payer: Self-pay | Admitting: Cardiovascular Disease

## 2021-09-04 ENCOUNTER — Encounter (HOSPITAL_BASED_OUTPATIENT_CLINIC_OR_DEPARTMENT_OTHER): Payer: Self-pay

## 2021-09-08 NOTE — Telephone Encounter (Signed)
Patient returned call. I gave her information below.

## 2021-09-18 ENCOUNTER — Other Ambulatory Visit: Payer: Self-pay | Admitting: Cardiovascular Disease

## 2021-11-14 ENCOUNTER — Other Ambulatory Visit: Payer: Self-pay | Admitting: Nurse Practitioner

## 2021-11-14 DIAGNOSIS — E1165 Type 2 diabetes mellitus with hyperglycemia: Secondary | ICD-10-CM

## 2021-11-14 NOTE — Telephone Encounter (Signed)
Chart supports Rx ?Last seen 07/2021 ?Due for f/u pt informed to make f/u appointment ?

## 2021-11-15 ENCOUNTER — Ambulatory Visit (HOSPITAL_COMMUNITY): Payer: 59 | Attending: Cardiology

## 2021-11-15 DIAGNOSIS — I5032 Chronic diastolic (congestive) heart failure: Secondary | ICD-10-CM | POA: Insufficient documentation

## 2021-11-15 LAB — ECHOCARDIOGRAM COMPLETE
Area-P 1/2: 2.83 cm2
S' Lateral: 1.9 cm

## 2021-11-16 ENCOUNTER — Telehealth: Payer: Self-pay | Admitting: Family

## 2021-11-16 MED ORDER — ISOSORBIDE DINITRATE 40 MG PO TABS: 40.0000 mg | ORAL_TABLET | Freq: Three times a day (TID) | ORAL | 11 refills | Status: DC

## 2021-11-16 MED ORDER — HYDRALAZINE HCL 50 MG PO TABS: 75.0000 mg | ORAL_TABLET | Freq: Three times a day (TID) | ORAL | 3 refills | Status: DC

## 2021-11-16 MED ORDER — ISOSORBIDE DINITRATE ER 40 MG PO TBCR: 40.0000 mg | EXTENDED_RELEASE_TABLET | Freq: Three times a day (TID) | ORAL | 3 refills | Status: DC

## 2021-11-16 NOTE — Telephone Encounter (Signed)
Patient called to ask about cost of her Bidil, consulted with NP  ? ? ? ?"Christine Lollar, RN discussed with pharmacy. Per pharmacy report her insurance will not cover branded medications even with copay card. As such, will split Bidil into generic medications. ?  ?Andrue Dini - please Rx: ?Discontinue Bidil ?Start Isosorbide Dinitrate 40mg  TID ?Start Hydralazine 75mg  TID (will need to use 50mg  tablet - take 1.5 tablets TID) ?  ? , NP"  ?

## 2021-11-16 NOTE — Telephone Encounter (Signed)
Patient returning your call.

## 2021-11-16 NOTE — Telephone Encounter (Signed)
RN corrected medication and sent to pharmacy ?

## 2021-11-16 NOTE — Telephone Encounter (Signed)
Rutherford Guys, RN discussed with pharmacy. Per pharmacy report her insurance will not cover branded medications even with copay card. As such, will split Bidil into generic medications. ? ?Kaila - please Rx: ?Discontinue Bidil ?Start Isosorbide Dinitrate 40mg  TID ?Start Hydralazine 75mg  TID (will need to use 50mg  tablet - take 1.5 tablets TID) ? ? , NP ? ?

## 2021-11-16 NOTE — Telephone Encounter (Signed)
Patient calling back for Christine Wilson in regards to her echo results. ?

## 2021-11-16 NOTE — Telephone Encounter (Signed)
Pt c/o medication issue: ? ?1. Name of Medication: ? isosorbide dinitrate (ISOCHRON) 40 MG CR tablet ? ?2. How are you currently taking this medication (dosage and times per day)? N/A ? ?3. Are you having a reaction (difficulty breathing--STAT)? No ? ?4. What is your medication issue? Pharmacy is calling stating they do not have an ER version of dinitrate available to them. Requesting a callback to discuss an alternative.   ?

## 2021-11-29 ENCOUNTER — Encounter: Payer: Self-pay | Admitting: Nurse Practitioner

## 2021-11-29 ENCOUNTER — Ambulatory Visit (INDEPENDENT_AMBULATORY_CARE_PROVIDER_SITE_OTHER): Payer: 59 | Admitting: Nurse Practitioner

## 2021-11-29 ENCOUNTER — Telehealth: Payer: Self-pay | Admitting: Nurse Practitioner

## 2021-11-29 VITALS — BP 100/62 | HR 70 | Temp 97.1°F | Ht 61.0 in | Wt 152.0 lb

## 2021-11-29 DIAGNOSIS — Z794 Long term (current) use of insulin: Secondary | ICD-10-CM

## 2021-11-29 DIAGNOSIS — E1165 Type 2 diabetes mellitus with hyperglycemia: Secondary | ICD-10-CM

## 2021-11-29 LAB — HEMOGLOBIN A1C: Hgb A1c MFr Bld: 6.6 % — ABNORMAL HIGH (ref 4.6–6.5)

## 2021-11-29 LAB — RENAL FUNCTION PANEL
Albumin: 4.3 g/dL (ref 3.5–5.2)
BUN: 17 mg/dL (ref 6–23)
CO2: 24 mEq/L (ref 19–32)
Calcium: 9.2 mg/dL (ref 8.4–10.5)
Chloride: 105 mEq/L (ref 96–112)
Creatinine, Ser: 1.1 mg/dL (ref 0.40–1.20)
GFR: 53.86 mL/min — ABNORMAL LOW (ref >60.00)
Glucose, Bld: 106 mg/dL — ABNORMAL HIGH (ref 70–99)
Phosphorus: 3 mg/dL (ref 2.3–4.6)
Potassium: 4.5 mEq/L (ref 3.5–5.1)
Sodium: 136 mEq/L (ref 135–145)

## 2021-11-29 MED ORDER — BLOOD GLUCOSE METER KIT: PACK | 0 refills | Status: DC

## 2021-11-29 NOTE — Assessment & Plan Note (Addendum)
Declined to take metformin ?We discussed possible complications from uncontrolled DM. Advised about need for diet modifications, exercise and use of medication. ?State she has decreased daily carb intake and daily exercise. ?She agreed to nutritionist referral and glucose check 2-3x/week. ?Orders entered ?Advised to schedule appt for annual DM eye exam ?No neuropathy ?Normal urine microalbumin ? ?Repeat hgbA1c and renal function: ?hgbA1c at 6.6 from 7.3% ?Improved renal function. ?Schedule appt with nutritionist ?F/up in 68months ?

## 2021-11-29 NOTE — Progress Notes (Signed)
? ?             Established Patient Visit ? ?Patient: Christine Wilson   DOB: Dec 21, 1960   61 y.o. Female  MRN: [id number] ?Visit Date: 11/29/2021 ? ?Subjective:  ?  ?Chief Complaint  ?Patient presents with  ? Follow-up  ?  4 month f/u on DM.  ?Pt did not start metformin and has not been checking blood sugars.  ?Pt states she has been walking.   ? ?HPI ?DM (diabetes mellitus) ([place]) ?Declined to take metformin ?We discussed possible complications from uncontrolled DM. Advised about need for diet modifications, exercise and use of medication. ?State she has decreased daily carb intake and daily exercise. ?She agreed to nutritionist referral and glucose check 2-3x/week. ?Orders entered ?Advised to schedule appt for annual DM eye exam ?No neuropathy ?Normal urine microalbumin ? ?Repeat hgbA1c and renal function: ?hgbA1c at 6.6 from 7.3% ?Improved renal function. ?Schedule appt with nutritionist ?F/up in 60month ? ?BP Readings from Last 3 Encounters:  ?11/29/21 100/62  ?08/28/21 122/62  ?08/07/21 106/60  ?  ?Wt Readings from Last 3 Encounters:  ?11/29/21 152 lb (68.9 kg)  ?08/28/21 156 lb (70.8 kg)  ?08/07/21 151 lb 6.4 oz (68.7 kg)  ?  ?Reviewed medical, surgical, and social history today ? ?Medications: ?Outpatient Medications Prior to Visit  ?Medication Sig  ? amLODipine (NORVASC) 5 MG tablet Take 1 tablet (5 mg total) by mouth daily.  ? Ascorbic Acid (VITAMIN C) 1000 MG tablet Take 1,000 mg by mouth daily.  ? aspirin 81 MG EC tablet Take 1 tablet (81 mg total) by mouth daily.  ? atorvastatin (LIPITOR) 40 MG tablet TAKE 1 TABLET BY MOUTH EVERY DAY AT 6 PM  ? BIOTIN FORTE PO Take 1 tablet by mouth daily as needed (takes occassionaly).   ? blood glucose meter kit and supplies KIT Dispense based on patient and insurance preference. Check glucose once a day before breakfast. E11.65  ? carvedilol (COREG) 6.25 MG tablet TAKE 1 TABLET(6.25 MG) BY MOUTH TWICE DAILY WITH A MEAL  ? hydrALAZINE (APRESOLINE) 50 MG tablet Take 1.5  tablets (75 mg total) by mouth 3 (three) times daily.  ? isosorbide dinitrate (ISORDIL) 40 MG tablet Take 1 tablet (40 mg total) by mouth 3 (three) times daily.  ? sacubitril-valsartan (ENTRESTO) 97-103 MG Take 1 tablet by mouth 2 (two) times daily.  ? spironolactone (ALDACTONE) 25 MG tablet TAKE 1 TABLET(25 MG) BY MOUTH DAILY  ? Vitamin D, Ergocalciferol, (DRISDOL) 1.25 MG (50000 UNIT) CAPS capsule TAKE 1 CAPSULE BY MOUTH EVERY 7 DAYS  ? [DISCONTINUED] metFORMIN (GLUCOPHAGE-XR) 500 MG 24 hr tablet TAKE 1 TABLET(500 MG) BY MOUTH DAILY WITH BREAKFAST (Patient not taking: Reported on 11/29/2021)  ? ?No facility-administered medications prior to visit.  ? ?Reviewed past medical and social history.  ? ?ROS per HPI above ? ? ?   ?Objective:  ?BP 100/62 (BP Location: Left Arm, Patient Position: Sitting, Cuff Size: Normal)   Pulse 70   Temp (!) 97.1 ?F (36.2 ?C) (Temporal)   Ht 5' 1"  (1.549 m)   Wt 152 lb (68.9 kg)   SpO2 99%   BMI 28.72 kg/m?  ? ?  ? ?Physical Exam ?Vitals reviewed.  ?Cardiovascular:  ?   Rate and Rhythm: Normal rate.  ?   Pulses: Normal pulses.  ?Pulmonary:  ?   Effort: Pulmonary effort is normal.  ?Neurological:  ?   Mental Status: She is alert and oriented to person, place, and time.  ?Psychiatric:     ?  Mood and Affect: Mood normal.     ?   Behavior: Behavior normal.  ?  ?Results for orders placed or performed in visit on 11/29/21  ?Hemoglobin A1c  ?Result Value Ref Range  ? Hgb A1c MFr Bld 6.6 (H) 4.6 - 6.5 %  ?Renal Function Panel  ?Result Value Ref Range  ? Sodium 136 135 - 145 mEq/L  ? Potassium 4.5 3.5 - 5.1 mEq/L  ? Chloride 105 96 - 112 mEq/L  ? CO2 24 19 - 32 mEq/L  ? Albumin 4.3 3.5 - 5.2 g/dL  ? BUN 17 6 - 23 mg/dL  ? Creatinine, Ser 1.10 0.40 - 1.20 mg/dL  ? Glucose, Bld 106 (H) 70 - 99 mg/dL  ? Phosphorus 3.0 2.3 - 4.6 mg/dL  ? GFR 53.86 (L) >60.00 mL/min  ? Calcium 9.2 8.4 - 10.5 mg/dL  ? ?   ?Assessment & Plan:  ?  ?Problem List Items Addressed This Visit   ? ?  ? Endocrine  ? DM  (diabetes mellitus) ([place]) - Primary  ?  Declined to take metformin ?We discussed possible complications from uncontrolled DM. Advised about need for diet modifications, exercise and use of medication. ?State she has decreased daily carb intake and daily exercise. ?She agreed to nutritionist referral and glucose check 2-3x/week. ?Orders entered ?Advised to schedule appt for annual DM eye exam ?No neuropathy ?Normal urine microalbumin ? ?Repeat hgbA1c and renal function: ?hgbA1c at 6.6 from 7.3% ?Improved renal function. ?Schedule appt with nutritionist ?F/up in 40month ? ?  ?  ? Relevant Medications  ? blood glucose meter kit and supplies  ? Other Relevant Orders  ? Hemoglobin A1c (Completed)  ? Referral to Nutrition and Diabetes Services  ? Renal Function Panel (Completed)  ? ?Return in about 3 months (around 03/01/2022) for DM and HTN, hyperlipidemia (fasting). ? ?  ? ?CWilfred Lacy NP ? ? ?

## 2021-11-29 NOTE — Patient Instructions (Addendum)
Go to lab ? ?Check glucose 2-3x/week before breakfast. ? ?You will be contacted to schedule appt with nutritionist. ? ?Schedule appt with ophthalmology for annual eye exam. ? ?Diabetes Mellitus and Nutrition, Adult ?When you have diabetes, or diabetes mellitus, it is very important to have healthy eating habits because your blood sugar (glucose) levels are greatly affected by what you eat and drink. Eating healthy foods in the right amounts, at about the same times every day, can help you: ?Manage your blood glucose. ?Lower your risk of heart disease. ?Improve your blood pressure. ?Reach or maintain a healthy weight. ?What can affect my meal plan? ?Every person with diabetes is different, and each person has different needs for a meal plan. Your health care provider may recommend that you work with a dietitian to make a meal plan that is best for you. Your meal plan may vary depending on factors such as: ?The calories you need. ?The medicines you take. ?Your weight. ?Your blood glucose, blood pressure, and cholesterol levels. ?Your activity level. ?Other health conditions you have, such as heart or kidney disease. ?How do carbohydrates affect me? ?Carbohydrates, also called carbs, affect your blood glucose level more than any other type of food. Eating carbs raises the amount of glucose in your blood. ?It is important to know how many carbs you can safely have in each meal. This is different for every person. Your dietitian can help you calculate how many carbs you should have at each meal and for each snack. ?How does alcohol affect me? ?Alcohol can cause a decrease in blood glucose (hypoglycemia), especially if you use insulin or take certain diabetes medicines by mouth. Hypoglycemia can be a life-threatening condition. Symptoms of hypoglycemia, such as sleepiness, dizziness, and confusion, are similar to symptoms of having too much alcohol. ?Do not drink alcohol if: ?Your health care provider tells you not to  drink. ?You are pregnant, may be pregnant, or are planning to become pregnant. ?If you drink alcohol: ?Limit how much you have to: ?0-1 drink a day for women. ?0-2 drinks a day for men. ?Know how much alcohol is in your drink. In the [place] one drink equals one 12 oz bottle of beer (355 mL), one 5 oz glass of wine (148 mL), or one 1? oz glass of hard liquor (44 mL). ?Keep yourself hydrated with water, diet soda, or unsweetened iced tea. Keep in mind that regular soda, juice, and other mixers may contain a lot of sugar and must be counted as carbs. ?What are tips for following this plan? ? ?Reading food labels ?Start by checking the serving size on the Nutrition Facts label of packaged foods and drinks. The number of calories and the amount of carbs, fats, and other nutrients listed on the label are based on one serving of the item. Many items contain more than one serving per package. ?Check the total grams (g) of carbs in one serving. ?Check the number of grams of saturated fats and trans fats in one serving. Choose foods that have a low amount or none of these fats. ?Check the number of milligrams (mg) of salt (sodium) in one serving. Most people should limit total sodium intake to less than 2,300 mg per day. ?Always check the nutrition information of foods labeled as "low-fat" or "nonfat." These foods may be higher in added sugar or refined carbs and should be avoided. ?Talk to your dietitian to identify your daily goals for nutrients listed on the label. ?Shopping ?Avoid buying canned,  pre-made, or processed foods. These foods tend to be high in fat, sodium, and added sugar. ?Shop around the outside edge of the grocery store. This is where you will most often find fresh fruits and vegetables, bulk grains, fresh meats, and fresh dairy products. ?Cooking ?Use low-heat cooking methods, such as baking, instead of high-heat cooking methods, such as deep frying. ?Cook using healthy oils, such as olive, canola, or  sunflower oil. ?Avoid cooking with butter, cream, or high-fat meats. ?Meal planning ?Eat meals and snacks regularly, preferably at the same times every day. Avoid going long periods of time without eating. ?Eat foods that are high in fiber, such as fresh fruits, vegetables, beans, and whole grains. ?Eat 4-6 oz (112-168 g) of lean protein each day, such as lean meat, chicken, fish, eggs, or tofu. One ounce (oz) (28 g) of lean protein is equal to: ?1 oz (28 g) of meat, chicken, or fish. ?1 egg. ?? cup (62 g) of tofu. ?Eat some foods each day that contain healthy fats, such as avocado, nuts, seeds, and fish. ?What foods should I eat? ?Fruits ?Berries. Apples. Oranges. Peaches. Apricots. Plums. Grapes. Mangoes. Papayas. Pomegranates. Kiwi. Cherries. ?Vegetables ?Leafy greens, including lettuce, spinach, kale, chard, collard greens, mustard greens, and cabbage. Beets. Cauliflower. Broccoli. Carrots. Green beans. Tomatoes. Peppers. Onions. Cucumbers. Brussels sprouts. ?Grains ?Whole grains, such as whole-wheat or whole-grain bread, crackers, tortillas, cereal, and pasta. Unsweetened oatmeal. Quinoa. Brown or wild rice. ?Meats and other proteins ?Seafood. Poultry without skin. Lean cuts of poultry and beef. Tofu. Nuts. Seeds. ?Dairy ?Low-fat or fat-free dairy products such as milk, yogurt, and cheese. ?The items listed above may not be a complete list of foods and beverages you can eat and drink. Contact a dietitian for more information. ?What foods should I avoid? ?Fruits ?Fruits canned with syrup. ?Vegetables ?Canned vegetables. Frozen vegetables with butter or cream sauce. ?Grains ?Refined white flour and flour products such as bread, pasta, snack foods, and cereals. Avoid all processed foods. ?Meats and other proteins ?Fatty cuts of meat. Poultry with skin. Breaded or fried meats. Processed meat. Avoid saturated fats. ?Dairy ?Full-fat yogurt, cheese, or milk. ?Beverages ?Sweetened drinks, such as soda or iced tea. ?The  items listed above may not be a complete list of foods and beverages you should avoid. Contact a dietitian for more information. ?Questions to ask a health care provider ?Do I need to meet with a certified diabetes care and education specialist? ?Do I need to meet with a dietitian? ?What number can I call if I have questions? ?When are the best times to check my blood glucose? ?Where to find more information: ?American Diabetes Association: diabetes.org ?Academy of Nutrition and Dietetics: eatright.org ?[employer] of Diabetes and Digestive and Kidney Diseases: StageSync.si ?Association of Diabetes Care & Education Specialists: diabeteseducator.org ?Summary ?It is important to have healthy eating habits because your blood sugar (glucose) levels are greatly affected by what you eat and drink. It is important to use alcohol carefully. ?A healthy meal plan will help you manage your blood glucose and lower your risk of heart disease. ?Your health care provider may recommend that you work with a dietitian to make a meal plan that is best for you. ?This information is not intended to replace advice given to you by your health care provider. Make sure you discuss any questions you have with your health care provider. ?Document Revised: 02/10/2020 Document Reviewed: 02/10/2020 ?Elsevier Patient Education ? 2023 Elsevier Inc. ? ?

## 2021-11-29 NOTE — Telephone Encounter (Signed)
-----   Message from Gaspar Cola, [facility] sent at 11/29/2021 12:33 PM EDT ----- ?Regarding: RE: isordil 40mg  assistance ?I was unable to find any assistance program or coupon cards for Isordil. I saw that cardiology team tried to help her get a copay card for Bidil, was she able to use that?  ? ? , PharmD, BCACP, CPP ?Clinical Pharmacist Practitioner  ?[facility]  ?[phone] ? ?----- Message ----- ?From: [phone], NP ?Sent: 11/29/2021  12:20 PM EDT ?To: 01/29/2022, RPH ?Subject: isordil 40mg  assistance                       ? ?Hello Alex, ?Do you know of any assistance program or coupon this patient can use to pay for isordil? ? ?Thank you ? ? ?

## 2021-12-01 NOTE — Telephone Encounter (Signed)
Pt called and I asked about the coupon. She said her cardiologist gave her one, but it did not make it any cheaper.  ?

## 2021-12-04 ENCOUNTER — Other Ambulatory Visit: Payer: Self-pay

## 2021-12-04 DIAGNOSIS — I160 Hypertensive urgency: Secondary | ICD-10-CM

## 2021-12-04 DIAGNOSIS — I5041 Acute combined systolic (congestive) and diastolic (congestive) heart failure: Secondary | ICD-10-CM

## 2021-12-04 MED ORDER — ENTRESTO 97-103 MG PO TABS: 1.0000 | ORAL_TABLET | Freq: Two times a day (BID) | ORAL | 2 refills | Status: DC

## 2021-12-05 NOTE — Telephone Encounter (Signed)
LVM for patient to return call. 

## 2021-12-19 ENCOUNTER — Encounter: Payer: Self-pay | Admitting: Skilled Nursing Facility1

## 2021-12-19 ENCOUNTER — Encounter: Payer: 59 | Attending: Nurse Practitioner | Admitting: Skilled Nursing Facility1

## 2021-12-19 DIAGNOSIS — E1165 Type 2 diabetes mellitus with hyperglycemia: Secondary | ICD-10-CM | POA: Insufficient documentation

## 2021-12-19 DIAGNOSIS — Z794 Long term (current) use of insulin: Secondary | ICD-10-CM | POA: Diagnosis not present

## 2021-12-19 NOTE — Progress Notes (Signed)
DM medications: Metformin 500 mg once a day  Supplement:  vitamin C Biotin Vitamin D  Dx: Congestive heart failure Hyperlipidemia HTN  A1C 6.6 down from 7.3 GFR 53.86  Pt states she never claimed to have had DM and is shocked she was told she has DM.  Pt states she is trying to stop eating fried chicken. Pt states she drinks 4 bottles of water a day.  Pt states she has been trying to get more activity in usually in the yard. Pt sates eh works at [employer].   Goals: Try sugar free natures twist orangeade or other sugar free drinks; look for only zeros on the nutrition facts label other than sodium  Check your blood sugars daily looking for patterns; log those numbers  Choose complex carbohydrates Avoid fried foods  Increase your non starchy vegetables 2 times  day 7 days a week  Diabetes Self-Management Education  Visit Type: First/Initial  12/19/2021  Christine Wilson, identified by name and date of birth, is a 61 y.o. female with a diagnosis of Diabetes: Type 2.   ASSESSMENT  There were no vitals taken for this visit. There is no height or weight on file to calculate BMI.   Diabetes Self-Management Education - 12/19/21 1533       Visit Information   Visit Type First/Initial      Initial Visit   Diabetes Type Type 2    Date Diagnosed 2 years ago    Are you currently following a meal plan? No    Are you taking your medications as prescribed? Yes      Health Coping   How would you rate your overall health? Good      Psychosocial Assessment   Patient Belief/Attitude about Diabetes Motivated to manage diabetes    What is the hardest part about your diabetes right now, causing you the most concern, or is the most worrisome to you about your diabetes?   Making healty food and beverage choices;Checking blood sugar;Being active;Getting support / problem solving    Self-care barriers None    Self-management support Friends    Patient Concerns Healthy Lifestyle     Special Needs None    Preferred Learning Style Visual;Auditory    Learning Readiness Ready    How often do you need to have someone help you when you read instructions, pamphlets, or other written materials from your doctor or pharmacy? 1 - Never      Pre-Education Assessment   Patient understands the diabetes disease and treatment process. Needs Instruction    Patient understands incorporating nutritional management into lifestyle. Needs Instruction    Patient undertands incorporating physical activity into lifestyle. Needs Instruction    Patient understands using medications safely. Needs Instruction    Patient understands monitoring blood glucose, interpreting and using results Needs Instruction    Patient understands prevention, detection, and treatment of acute complications. Needs Instruction    Patient understands prevention, detection, and treatment of chronic complications. Needs Instruction    Patient understands how to develop strategies to address psychosocial issues. Needs Instruction    Patient understands how to develop strategies to promote health/change behavior. Needs Instruction      Complications   Last HgB A1C per patient/outside source 6.6 %    Have you had a dilated eye exam in the past 12 months? No    Have you had a dental exam in the past 12 months? Yes    Are you checking your feet? No  Dietary Intake   Breakfast orange juice + sausage  + grits + eggs or oatmeal    Lunch skipped or crackers or chips    Dinner cubed steak or chicken or vegetable soup or fish sandwich    Snack (evening) ice cream or popsicle or cookie and milk    Beverage(s) orange juice, milk, gingerale soad, ornage soda, dr pepper, sweet tea, water, fruit punch      Activity / Exercise   Activity / Exercise Type ADL's    How many days per week do you exercise? 0    How many minutes per day do you exercise? 0    Total minutes per week of exercise 0      Patient Education   Previous  Diabetes Education No    Disease Pathophysiology Definition of diabetes, type 1 and 2, and the diagnosis of diabetes;Factors that contribute to the development of diabetes    Healthy Eating Role of diet in the treatment of diabetes and the relationship between the three main macronutrients and blood glucose level;Food label reading, portion sizes and measuring food.;Plate Method;Meal options for control of blood glucose level and chronic complications.    Being Active Role of exercise on diabetes management, blood pressure control and cardiac health.    Medications Reviewed patients medication for diabetes, action, purpose, timing of dose and side effects.    Monitoring Taught/evaluated SMBG meter.;Purpose and frequency of SMBG.;Interpreting lab values - A1C, lipid, urine microalbumina.;Identified appropriate SMBG and/or A1C goals.;Daily foot exams;Yearly dilated eye exam    Acute complications Taught prevention, symptoms, and  treatment of hypoglycemia - the 15 rule.;Discussed and identified patients' prevention, symptoms, and treatment of hyperglycemia.    Chronic complications Dental care;Retinopathy and reason for yearly dilated eye exams;Nephropathy, what it is, prevention of, the use of ACE, ARB's and early detection of through urine microalbumia.;Relationship between chronic complications and blood glucose control    Diabetes Stress and Support Worked with patient to identify barriers to care and solutions;Role of stress on diabetes;Identified and addressed patients feelings and concerns about diabetes    Lifestyle and Health Coping Lifestyle issues that need to be addressed for better diabetes care      Individualized Goals (developed by patient)   Nutrition General guidelines for healthy choices and portions discussed;Follow meal plan discussed    Physical Activity Exercise 5-7 days per week;30 minutes per day    Medications take my medication as prescribed    Monitoring  Test my blood  glucose as discussed;Test blood glucose pre and post meals as discussed    Problem Solving Eating Pattern;Addressing barriers to behavior change    Reducing Risk do foot checks daily;treat hypoglycemia with 15 grams of carbs if blood glucose less than 70mg /dL      Post-Education Assessment   Patient understands the diabetes disease and treatment process. Demonstrates understanding / competency    Patient understands incorporating nutritional management into lifestyle. Demonstrates understanding / competency    Patient undertands incorporating physical activity into lifestyle. Demonstrates understanding / competency    Patient understands using medications safely. Demonstrates understanding / competency    Patient understands monitoring blood glucose, interpreting and using results Demonstrates understanding / competency    Patient understands prevention, detection, and treatment of acute complications. Demonstrates understanding / competency    Patient understands prevention, detection, and treatment of chronic complications. Demonstrates understanding / competency    Patient understands how to develop strategies to address psychosocial issues. Demonstrates understanding / competency  Patient understands how to develop strategies to promote health/change behavior. Demonstrates understanding / competency      Outcomes   Expected Outcomes Demonstrated interest in learning. Expect positive outcomes    Future DMSE 3-4 months    Program Status Completed             Individualized Plan for Diabetes Self-Management Training:   Learning Objective:  Patient will have a greater understanding of diabetes self-management. Patient education plan is to attend individual and/or group sessions per assessed needs and concerns.   Expected Outcomes:  Demonstrated interest in learning. Expect positive outcomes  Education material provided: ADA - How to Thrive: A Guide for Your Journey with Diabetes,  Meal plan card, My Plate, and Snack sheet  If problems or questions, patient to contact team via:  Phone and Email  Future DSME appointment: 3-4 months

## 2021-12-26 ENCOUNTER — Other Ambulatory Visit: Payer: Self-pay | Admitting: Nurse Practitioner

## 2021-12-26 NOTE — Telephone Encounter (Signed)
Chart Supports Rx Last OV: 11/2021 

## 2022-01-13 ENCOUNTER — Other Ambulatory Visit: Payer: Self-pay | Admitting: Cardiovascular Disease

## 2022-01-15 ENCOUNTER — Ambulatory Visit: Payer: 59 | Admitting: Skilled Nursing Facility1

## 2022-03-01 ENCOUNTER — Other Ambulatory Visit: Payer: Self-pay | Admitting: Cardiovascular Disease

## 2022-03-01 ENCOUNTER — Other Ambulatory Visit: Payer: Self-pay | Admitting: Nurse Practitioner

## 2022-03-01 NOTE — Telephone Encounter (Signed)
Rx request sent to pharmacy.  

## 2022-03-21 ENCOUNTER — Encounter: Payer: Commercial Managed Care - HMO | Attending: Nurse Practitioner | Admitting: Skilled Nursing Facility1

## 2022-03-21 ENCOUNTER — Encounter: Payer: Self-pay | Admitting: Skilled Nursing Facility1

## 2022-03-21 DIAGNOSIS — Z794 Long term (current) use of insulin: Secondary | ICD-10-CM | POA: Diagnosis present

## 2022-03-21 DIAGNOSIS — E1165 Type 2 diabetes mellitus with hyperglycemia: Secondary | ICD-10-CM | POA: Diagnosis present

## 2022-03-21 NOTE — Progress Notes (Signed)
DM medications: Metformin 500 mg once a day  Supplement:  vitamin C Biotin Vitamin D  Dx: Congestive heart failure Hyperlipidemia HTN  A1C 6.6 down from 7.3 GFR 53.86  Pt states she never claimed to have had DM and is shocked she was told she has DM.  Pt states she is trying to stop eating fried chicken. Pt states she drinks 4 bottles of water a day.  Pt states she has been trying to get more activity in usually in the yard. Pt sates eh works at [employer].   Pt states she has not been checking her blood sugars because she does not like pricking herself. Pt states she does brush her teeth 2 times a day and flosses most days of the week.  Pt states she is trying to reduce the size of her sweets.  Pt states she was able to cut friend chicken to once a month Pt states she has cut back on her soda and increased her water.   Goals: Continue: Try sugar free natures twist orangeade or other sugar free drinks; look for only zeros on the nutrition facts label other than sodium  Continue: Check your blood sugars daily looking for patterns; log those numbers  Continue: Choose complex carbohydrates Continue: Avoid fried foods  Continue: Increase your non starchy vegetables 2 times  day 7 days a week NEW: try ICE NEW: ride your stationary bike 30 minutes 2 days a week NEW: do not add sugar to your oatmeal try cinnamon  Diabetes Self-Management Education  Visit Type:    03/21/2022  Ms. Christine Wilson, identified by name and date of birth, is a 61 y.o. female with a diagnosis of Diabetes:  .    Future DSME appointment:

## 2022-04-10 ENCOUNTER — Telehealth: Payer: Self-pay | Admitting: Pharmacist

## 2022-04-10 NOTE — Telephone Encounter (Signed)
Patient returned call, states now insured with [employer], but does not have a card at this time.  Explained that she will need to reach out to them to get a card, then take it to her pharmacy for them to update.  Assured her that isosorbide dinitrate and hydralazine would be covered on her new plan

## 2022-04-10 NOTE — Telephone Encounter (Signed)
Patient left message regarding insurance and medications. Called patient back and lmom

## 2022-05-16 ENCOUNTER — Other Ambulatory Visit: Payer: Self-pay | Admitting: Nurse Practitioner

## 2022-06-05 ENCOUNTER — Other Ambulatory Visit: Payer: Self-pay | Admitting: Nurse Practitioner

## 2022-06-05 DIAGNOSIS — Z1231 Encounter for screening mammogram for malignant neoplasm of breast: Secondary | ICD-10-CM

## 2022-06-13 ENCOUNTER — Ambulatory Visit
Admission: RE | Admit: 2022-06-13 | Discharge: 2022-06-13 | Disposition: A | Discharge status: Home / Self Care | Payer: Commercial Managed Care - HMO | Source: Ambulatory Visit | Attending: Nurse Practitioner | Admitting: Nurse Practitioner

## 2022-06-13 DIAGNOSIS — Z1231 Encounter for screening mammogram for malignant neoplasm of breast: Secondary | ICD-10-CM

## 2022-08-13 ENCOUNTER — Ambulatory Visit: Payer: Commercial Managed Care - HMO

## 2022-08-28 ENCOUNTER — Other Ambulatory Visit (HOSPITAL_BASED_OUTPATIENT_CLINIC_OR_DEPARTMENT_OTHER): Payer: Self-pay | Admitting: Cardiovascular Disease

## 2022-08-28 DIAGNOSIS — I5041 Acute combined systolic (congestive) and diastolic (congestive) heart failure: Secondary | ICD-10-CM

## 2022-08-28 DIAGNOSIS — I160 Hypertensive urgency: Secondary | ICD-10-CM

## 2022-08-28 NOTE — Telephone Encounter (Signed)
Rx(s) sent to pharmacy electronically.  

## 2022-11-29 ENCOUNTER — Other Ambulatory Visit: Payer: Self-pay

## 2022-11-29 MED ORDER — AMLODIPINE BESYLATE 5 MG PO TABS: 5.0000 mg | ORAL_TABLET | Freq: Every day | ORAL | 0 refills | Status: DC

## 2022-12-04 ENCOUNTER — Other Ambulatory Visit: Payer: Self-pay | Admitting: Nurse Practitioner

## 2023-01-30 ENCOUNTER — Other Ambulatory Visit (HOSPITAL_BASED_OUTPATIENT_CLINIC_OR_DEPARTMENT_OTHER): Payer: Self-pay | Admitting: Cardiovascular Disease

## 2023-01-30 NOTE — Telephone Encounter (Signed)
Rx(s) sent to pharmacy electronically.  

## 2023-02-06 ENCOUNTER — Other Ambulatory Visit: Payer: Self-pay

## 2023-02-06 ENCOUNTER — Telehealth (HOSPITAL_BASED_OUTPATIENT_CLINIC_OR_DEPARTMENT_OTHER): Payer: Self-pay | Admitting: *Deleted

## 2023-02-06 MED ORDER — ISOSORBIDE DINITRATE 40 MG PO TABS: 40.0000 mg | ORAL_TABLET | Freq: Three times a day (TID) | ORAL | 0 refills | Status: DC

## 2023-02-06 MED ORDER — HYDRALAZINE HCL 50 MG PO TABS: 75.0000 mg | ORAL_TABLET | Freq: Three times a day (TID) | ORAL | 0 refills | Status: DC

## 2023-02-06 NOTE — Telephone Encounter (Signed)
Rx(s) sent to pharmacy electronically.  

## 2023-02-27 ENCOUNTER — Other Ambulatory Visit: Payer: Self-pay | Admitting: Cardiovascular Disease

## 2023-03-07 ENCOUNTER — Other Ambulatory Visit (HOSPITAL_BASED_OUTPATIENT_CLINIC_OR_DEPARTMENT_OTHER): Payer: Self-pay | Admitting: Cardiovascular Disease

## 2023-04-08 ENCOUNTER — Other Ambulatory Visit: Payer: Self-pay | Admitting: *Deleted

## 2023-04-08 MED ORDER — HYDRALAZINE HCL 50 MG PO TABS: 75.0000 mg | ORAL_TABLET | Freq: Three times a day (TID) | ORAL | 0 refills | Status: DC

## 2023-04-09 ENCOUNTER — Telehealth: Payer: Self-pay | Admitting: Nurse Practitioner

## 2023-04-09 NOTE — Telephone Encounter (Signed)
Pt has an appt this Friday the 20th. She is out of her heart mec and is asking that you send in enough till Friday.

## 2023-04-10 NOTE — Telephone Encounter (Signed)
Pt returned your call.  

## 2023-04-10 NOTE — Telephone Encounter (Signed)
LVM for patient to return call. 

## 2023-04-10 NOTE — Telephone Encounter (Signed)
I called and spoke with patient and wanted to clarify medication that she is requesting to be filled. Requesting: Carvedilol 6.25mg , Spironolactone 25mg , Entresto 97-103mg  Last Visit: 11/29/21 Next Visit: 04/12/2023 Last Refill: 07/18/21, 03/01/22, 08/28/22  Please Advise

## 2023-04-10 NOTE — Telephone Encounter (Signed)
Patient notified of below message and will call cardiology and keep appointment on Friday.

## 2023-04-12 ENCOUNTER — Encounter: Payer: Self-pay | Admitting: Nurse Practitioner

## 2023-04-12 ENCOUNTER — Ambulatory Visit (INDEPENDENT_AMBULATORY_CARE_PROVIDER_SITE_OTHER): Payer: 59 | Admitting: Nurse Practitioner

## 2023-04-12 VITALS — BP 130/78 | HR 85 | Temp 98.1°F | Wt 158.6 lb

## 2023-04-12 DIAGNOSIS — N1831 Chronic kidney disease, stage 3a: Secondary | ICD-10-CM

## 2023-04-12 DIAGNOSIS — M25512 Pain in left shoulder: Secondary | ICD-10-CM | POA: Diagnosis not present

## 2023-04-12 DIAGNOSIS — G8929 Other chronic pain: Secondary | ICD-10-CM | POA: Insufficient documentation

## 2023-04-12 DIAGNOSIS — E785 Hyperlipidemia, unspecified: Secondary | ICD-10-CM | POA: Diagnosis not present

## 2023-04-12 DIAGNOSIS — E559 Vitamin D deficiency, unspecified: Secondary | ICD-10-CM | POA: Diagnosis not present

## 2023-04-12 DIAGNOSIS — D509 Iron deficiency anemia, unspecified: Secondary | ICD-10-CM | POA: Diagnosis not present

## 2023-04-12 DIAGNOSIS — B353 Tinea pedis: Secondary | ICD-10-CM | POA: Insufficient documentation

## 2023-04-12 DIAGNOSIS — I5032 Chronic diastolic (congestive) heart failure: Secondary | ICD-10-CM | POA: Diagnosis not present

## 2023-04-12 DIAGNOSIS — E1169 Type 2 diabetes mellitus with other specified complication: Secondary | ICD-10-CM

## 2023-04-12 DIAGNOSIS — Z794 Long term (current) use of insulin: Secondary | ICD-10-CM | POA: Diagnosis not present

## 2023-04-12 DIAGNOSIS — E1165 Type 2 diabetes mellitus with hyperglycemia: Secondary | ICD-10-CM | POA: Diagnosis not present

## 2023-04-12 LAB — LDL CHOLESTEROL, DIRECT: Direct LDL: 73 mg/dL

## 2023-04-12 LAB — CBC WITH DIFFERENTIAL/PLATELET
Basophils Absolute: 0 10*3/uL (ref 0.0–0.1)
Basophils Relative: 0.2 % (ref 0.0–3.0)
Eosinophils Absolute: 0 10*3/uL (ref 0.0–0.7)
Eosinophils Relative: 0.5 % (ref 0.0–5.0)
HCT: 33.6 % — ABNORMAL LOW (ref 36.0–46.0)
Hemoglobin: 10.2 g/dL — ABNORMAL LOW (ref 12.0–15.0)
Lymphocytes Relative: 23.9 % (ref 12.0–46.0)
Lymphs Abs: 1.5 10*3/uL (ref 0.7–4.0)
MCHC: 30.4 g/dL (ref 30.0–36.0)
MCV: 68.1 fl — ABNORMAL LOW (ref 78.0–100.0)
Monocytes Absolute: 0.5 10*3/uL (ref 0.1–1.0)
Monocytes Relative: 8.1 % (ref 3.0–12.0)
Neutro Abs: 4.1 10*3/uL (ref 1.4–7.7)
Neutrophils Relative %: 67.3 % (ref 43.0–77.0)
Platelets: 292 10*3/uL (ref 150.0–400.0)
RBC: 4.93 Mil/uL (ref 3.87–5.11)
RDW: 16.5 % — ABNORMAL HIGH (ref 11.5–15.5)
WBC: 6.1 10*3/uL (ref 4.0–10.5)

## 2023-04-12 LAB — COMPREHENSIVE METABOLIC PANEL
ALT: 20 U/L (ref 0–35)
AST: 15 U/L (ref 0–37)
Albumin: 4.3 g/dL (ref 3.5–5.2)
Alkaline Phosphatase: 112 U/L (ref 39–117)
BUN: 10 mg/dL (ref 6–23)
CO2: 27 mEq/L (ref 19–32)
Calcium: 9.6 mg/dL (ref 8.4–10.5)
Chloride: 103 mEq/L (ref 96–112)
Creatinine, Ser: 0.88 mg/dL (ref 0.40–1.20)
GFR: 69.72 mL/min (ref >60.00)
Glucose, Bld: 106 mg/dL — ABNORMAL HIGH (ref 70–99)
Potassium: 3.6 mEq/L (ref 3.5–5.1)
Sodium: 140 mEq/L (ref 135–145)
Total Bilirubin: 0.4 mg/dL (ref 0.2–1.2)
Total Protein: 7.1 g/dL (ref 6.0–8.3)

## 2023-04-12 LAB — VITAMIN D 25 HYDROXY (VIT D DEFICIENCY, FRACTURES): VITD: 16.63 ng/mL — ABNORMAL LOW (ref 30.00–100.00)

## 2023-04-12 LAB — HEMOGLOBIN A1C: Hgb A1c MFr Bld: 7 % — ABNORMAL HIGH (ref 4.6–6.5)

## 2023-04-12 MED ORDER — MICONAZOLE NITRATE 2 % EX POWD
Freq: Two times a day (BID) | CUTANEOUS | Status: AC
Start: 2023-04-12 — End: ?

## 2023-04-12 MED ORDER — METFORMIN HCL ER 500 MG PO TB24: 500.0000 mg | ORAL_TABLET | Freq: Every day | ORAL | 1 refills | Status: DC

## 2023-04-12 MED ORDER — TERBINAFINE HCL 1 % EX CREA
1.0000 | TOPICAL_CREAM | Freq: Two times a day (BID) | CUTANEOUS | 0 refills | Status: DC
Start: 2023-04-12 — End: 2023-07-12

## 2023-04-12 MED ORDER — VITAMIN D (ERGOCALCIFEROL) 1.25 MG (50000 UNIT) PO CAPS: 50000.0000 [IU] | ORAL_CAPSULE | ORAL | 0 refills | Status: DC

## 2023-04-12 NOTE — Addendum Note (Signed)
Addended by: Michaela Corner on: 04/12/2023 04:43 PM   Modules accepted: Orders

## 2023-04-12 NOTE — Assessment & Plan Note (Signed)
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 Pain improved with warm compress  Get shoulder x-ray Entered referral to sports medicine

## 2023-04-12 NOTE — Assessment & Plan Note (Addendum)
Diet controlled at this time Agreed to DIABETES [facility] at [facility] BP at goal Normal foot exam today Current use of statin Repeat UACr, hgbA1c, direct LDL, and renal function

## 2023-04-12 NOTE — Assessment & Plan Note (Signed)
Repeat BMP and CBC No LE edema

## 2023-04-12 NOTE — Assessment & Plan Note (Signed)
Repeat cbc, iron panel

## 2023-04-12 NOTE — Progress Notes (Signed)
Established Patient Visit  Patient: Christine Wilson   DOB: 29-Apr-1961   62 y.o. Female  MRN: [id number] Visit Date: 04/12/2023  Subjective:    Chief Complaint  Patient presents with   Medical Management of Chronic Issues    Discuss medications   Rash This is a recurrent problem. The current episode started more than 1 year ago. The problem has been waxing and waning since onset. The affected locations include the left foot. The rash is characterized by dryness, scaling and itchiness. She was exposed to nothing. Associated symptoms include nail changes. Past treatments include nothing. There is no history of allergies or eczema.   Microcytic anemia Repeat cbc, iron panel  DM (diabetes mellitus) (HCC) Diet controlled at this time Agreed to DIABETES eye exam at mobile clinic BP at goal Normal foot exam today Current use of statin Repeat UACr, hgbA1c, direct LDL, and renal function  Chronic diastolic heart failure (HCC) Denies any CP or SOB or LE edema or PND. Advised to schedule f/up with cardiology. Current use of spirolactone, entresto, hydralazine, coreg and amlodipine  Stage 3a chronic kidney disease (HCC) Repeat BMP and CBC No LE edema  Vitamin D deficiency Repeat Vit. 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 Pain improved with warm compress  Get shoulder x-ray Entered referral to sports medicine  BP Readings from Last 3 Encounters:  04/12/23 130/78  11/29/21 100/62  08/28/21 122/62    Wt Readings from Last 3 Encounters:  04/12/23 158 lb 9.6 oz (71.9 kg)  11/29/21 152 lb (68.9 kg)  08/28/21 156 lb (70.8 kg)    Reviewed medical, surgical, and  social history today  Medications: Outpatient Medications Prior to Visit  Medication Sig   amLODipine (NORVASC) 5 MG tablet TAKE 1 TABLET (5 MG TOTAL) BY MOUTH DAILY. PLEASE SCHEDULE APPT FOR FUTURE REFILLS. 1ST ATTEMPT   Ascorbic Acid (VITAMIN C) 1000 MG tablet Take 1,000 mg by mouth daily.   aspirin 81 MG EC tablet Take 1 tablet (81 mg total) by mouth daily.   atorvastatin (LIPITOR) 40 MG tablet TAKE 1 TABLET (40 MG TOTAL) BY MOUTH DAILY. NEED APPOINTMENT   BIOTIN FORTE PO Take 1 tablet by mouth daily as needed (takes occassionaly).    blood glucose meter kit and supplies KIT Dispense based on patient and insurance preference. Check glucose once a day before breakfast. E11.65   blood glucose meter kit and supplies Dispense based on patient and insurance preference. Check once a day in AM. E11.65   carvedilol (COREG) 6.25 MG tablet TAKE 1 TABLET(6.25 MG) BY MOUTH TWICE DAILY WITH A MEAL   glucose blood (CONTOUR NEXT TEST) test strip USE ONCE DAILY IN THE MORNING   hydrALAZINE (APRESOLINE) 50 MG tablet Take 1.5 tablets (75 mg total) by mouth 3 (three) times daily. 2nd attempt. Pt needs yearly appointment for # 90 supply or future refills. Please call office to schedule appt.   Microlet Lancets MISC USE ONCE DAILY IN THE MORNING   sacubitril-valsartan (ENTRESTO) 97-103 MG TAKE 1 TABLET BY MOUTH TWICE DAILY   spironolactone (ALDACTONE) 25 MG tablet TAKE 1 TABLET(25 MG) BY MOUTH  DAILY   isosorbide dinitrate (ISORDIL) 40 MG tablet Take 1 tablet (40 mg total) by mouth 3 (three) times daily. NEED APPOINTMENT (Patient not taking: Reported on 04/12/2023)   Vitamin D, Ergocalciferol, (DRISDOL) 1.25 MG (50000 UNIT) CAPS capsule TAKE 1 CAPSULE BY MOUTH EVERY 7 DAYS (Patient not taking: Reported on 04/12/2023)   No facility-administered medications prior to visit.   Reviewed past medical and social history.   ROS per HPI above      Objective:  BP 130/78 (BP Location: Left Arm, Patient Position:  Sitting, Cuff Size: Large)   Pulse 85   Temp 98.1 F (36.7 C) (Temporal)   Wt 158 lb 9.6 oz (71.9 kg)   SpO2 98%   BMI 29.97 kg/m      Physical Exam Cardiovascular:     Rate and Rhythm: Normal rate and regular rhythm.     Pulses: Normal pulses.          Dorsalis pedis pulses are 2+ on the right side and 2+ on the left side.       Posterior tibial pulses are 2+ on the right side and 2+ on the left side.     Heart sounds: Normal heart sounds.  Pulmonary:     Effort: Pulmonary effort is normal.     Breath sounds: Normal breath sounds.  Musculoskeletal:     Right lower leg: No edema.     Left lower leg: No edema.  Feet:     Right foot:     Skin integrity: Dry skin present. No ulcer, blister, skin breakdown, erythema or callus.     Toenail Condition: Right toenails are abnormally thick. Fungal disease present.    Left foot:     Skin integrity: Dry skin present. No ulcer, blister, skin breakdown, erythema or callus.     Toenail Condition: Left toenails are abnormally thick. Fungal disease present. Skin:    Findings: Rash present. Rash is macular.  Neurological:     Mental Status: She is alert and oriented to person, place, and time.     No results found for any visits on 04/12/23.    Assessment & Plan:    Problem List Items Addressed This Visit     Chronic diastolic heart failure (HCC)    Denies any CP or SOB or LE edema or PND. Advised to schedule f/up with cardiology. 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 Pain improved with warm compress  Get shoulder x-ray Entered referral to sports medicine      Relevant Orders   Ambulatory referral to Sports Medicine   DG Shoulder Left   DM (diabetes  mellitus) (HCC)    Diet controlled at this time Agreed to DIABETES eye exam at mobile clinic BP at goal Normal foot exam today Current use of statin Repeat UACr, hgbA1c, direct LDL, and renal function      Relevant Orders   Comprehensive metabolic panel   Hemoglobin A1c   Microalbumin / creatinine urine ratio   Hyperlipidemia associated with type 2 diabetes mellitus (HCC) - Primary    Repeat direct LDL  maintain atorvastatin dose      Relevant Orders   Comprehensive metabolic panel   Direct LDL  Microcytic anemia    Repeat cbc, iron panel      Relevant Orders   CBC with Differential/Platelet   Iron, TIBC and Ferritin Panel   Stage 3a chronic kidney disease (HCC)    Repeat BMP and CBC No LE edema      Relevant Orders   Comprehensive metabolic panel   Tinea pedis of left foot   Relevant Medications   miconazole (MICOTIN) 2 % powder   terbinafine (LAMISIL) 1 % cream   Vitamin D deficiency    Repeat Vit. D      Relevant Orders   VITAMIN D 25 Hydroxy (Vit-D Deficiency, Fractures)   Return in about 3 months (around 07/12/2023) for HTN, DM, hyperlipidemia (fasting).     Alysia Penna, NP

## 2023-04-12 NOTE — Assessment & Plan Note (Signed)
Denies any CP or SOB or LE edema or PND. Advised to schedule f/up with cardiology. Current use of spirolactone, entresto, hydralazine, coreg and amlodipine

## 2023-04-12 NOTE — Patient Instructions (Signed)
Go to lab Schedule appointment with cardiology as soon as possible Maintain current med doses

## 2023-04-12 NOTE — Assessment & Plan Note (Signed)
Repeat direct LDL  maintain atorvastatin dose

## 2023-04-12 NOTE — Assessment & Plan Note (Signed)
Repeat Vit D

## 2023-04-13 LAB — IRON,TIBC AND FERRITIN PANEL
%SAT: 15 % (calc) — ABNORMAL LOW (ref 16–45)
Ferritin: 38 ng/mL (ref 16–288)
Iron: 49 ug/dL (ref 45–160)
TIBC: 327 mcg/dL (calc) (ref 250–450)

## 2023-04-13 LAB — MICROALBUMIN / CREATININE URINE RATIO
Creatinine, Urine: 134 mg/dL (ref 20–275)
Microalb Creat Ratio: 33 mg/g creat — ABNORMAL HIGH (ref <30)
Microalb, Ur: 4.4 mg/dL

## 2023-04-16 ENCOUNTER — Ambulatory Visit (INDEPENDENT_AMBULATORY_CARE_PROVIDER_SITE_OTHER): Payer: 59 | Admitting: Family Medicine

## 2023-04-16 ENCOUNTER — Encounter: Payer: Self-pay | Admitting: Family Medicine

## 2023-04-16 ENCOUNTER — Ambulatory Visit (INDEPENDENT_AMBULATORY_CARE_PROVIDER_SITE_OTHER): Payer: 59

## 2023-04-16 ENCOUNTER — Other Ambulatory Visit: Payer: Self-pay

## 2023-04-16 VITALS — BP 132/78 | HR 89 | Ht 61.0 in | Wt 160.0 lb

## 2023-04-16 DIAGNOSIS — G8929 Other chronic pain: Secondary | ICD-10-CM | POA: Diagnosis not present

## 2023-04-16 DIAGNOSIS — M25561 Pain in right knee: Secondary | ICD-10-CM | POA: Diagnosis not present

## 2023-04-16 DIAGNOSIS — M25512 Pain in left shoulder: Secondary | ICD-10-CM | POA: Diagnosis not present

## 2023-04-16 DIAGNOSIS — M25562 Pain in left knee: Secondary | ICD-10-CM | POA: Diagnosis not present

## 2023-04-16 DIAGNOSIS — M19012 Primary osteoarthritis, left shoulder: Secondary | ICD-10-CM | POA: Diagnosis not present

## 2023-04-16 NOTE — Patient Instructions (Addendum)
Thank you for coming in today.  I've referred you to Physical Therapy.  Let us know if you don't hear from them in one week.   Please get an Xray today before you leave   Recheck in 6 weeks.   Let me know sooner if this is not working.   Tylenol arthritis  (generic version) is safe for you to take. You can take 2 every 8 hour as needed for pain.   Avoid ibuprofen or aleve unless you are just miserable.  Please use Voltaren gel (Generic Diclofenac Gel) up to 4x daily for pain as needed.  This is available over-the-counter as both the name brand Voltaren gel and the generic diclofenac gel.

## 2023-04-16 NOTE — Progress Notes (Signed)
I, Stevenson Clinch, CMA acting as a scribe for Christine Graham, MD.  Christine Wilson is a 62 y.o. female who presents to [facility] at [facility] today for evaluation of chronic left shoulder pain. Pt reports shoulder pain x 2 months radiating into the upper arm. Pt is RHD but uses left hand for repetitive tasks. Pt notes decreased ROM in the shoulder. XR of the shoulder was ordered by PCP but not performed.   Today, patient reports that sx are about the same. Sx worse with overhead reaching. Limited ROM reaching back. Locates pain to lateral aspect. Occasional sharp shooting pains into the arm. Notes n/t in the hand in the mornings. Occasional neck pain. Denies weakness in the arm or decreased grip strength.     Pertinent review of systems: No fevers or chills  Relevant historical information: Diabetes and hypertension.  CAD.   Exam:  BP 132/78   Pulse 89   Ht 5\' 1"  (1.549 m)   Wt 160 lb (72.6 kg)   SpO2 99%   BMI 30.23 kg/m  General: Well Developed, well nourished, and in no acute distress.   MSK: Left shoulder normal-appearing Nontender. Range of motion abduction 130 degrees.  Functional internal rotation posterior iliac crest.  External rotation is full. Strength 4/5 abduction 5/5 external and internal rotation. Positive Hawkins and Neer's test.  Positive empty can test. Pulses cap refill and sensation are intact distally.    Lab and Radiology Results  Diagnostic Limited MSK Ultrasound of: Left shoulder Biceps tendon intact appearing. Subscapularis tendon is intact appearing. Supraspinatus tendon no visible tear or retraction intact appearing with mild subacromial bursitis present. Infraspinatus tendon appears to be intact. AC joint effusion is present. Impression: Subacromial bursitis  X-ray images left shoulder obtained today personally and independently interpreted No significant glenohumeral or AC DJD.  No acute fractures are visible. Await formal  radiology review   Assessment and Plan: 62 y.o. female with chronic left shoulder pain thought to be due to subacromial impingement and bursitis.  She is a good candidate for physical therapy.  Plan for trial of PT.  Check back in 6 weeks.  If not improved consider steroid injection.  We talked about safe oral medications.  Recommend Tylenol instead of ibuprofen.  Recommend also trying Voltaren gel topically.  Additionally she notes some mild bilateral knee pain.  Will add this on to PT.  PDMP not reviewed this encounter. Orders Placed This Encounter  Procedures   [place] LIMITED JOINT SPACE STRUCTURES UP LEFT(NO LINKED CHARGES)    Order Specific Question:   Reason for Exam (SYMPTOM  OR DIAGNOSIS REQUIRED)    Answer:   chronic left shoulder pain    Order Specific Question:   Preferred imaging location?    Answer:   [facility] Shoulder Left    Standing Status:   Future    Number of Occurrences:   1    Standing Expiration Date:   04/15/2024    Order Specific Question:   Reason for Exam (SYMPTOM  OR DIAGNOSIS REQUIRED)    Answer:   eval shoulder pain    Order Specific Question:   Preferred imaging location?    Answer:   [facility]   Ambulatory referral to Physical Therapy    Referral Priority:   Routine    Referral Type:   Physical Medicine    Referral Reason:   Specialty Services Required    Requested Specialty:   Physical  Therapy    Number of Visits Requested:   1   No orders of the defined types were placed in this encounter.    Discussed warning signs or symptoms. Please see discharge instructions. Patient expresses understanding.   The above documentation has been reviewed and is accurate and complete Christine Wilson, M.D.

## 2023-04-29 ENCOUNTER — Other Ambulatory Visit: Payer: Self-pay | Admitting: Nurse Practitioner

## 2023-04-29 DIAGNOSIS — Z1231 Encounter for screening mammogram for malignant neoplasm of breast: Secondary | ICD-10-CM

## 2023-05-01 ENCOUNTER — Encounter: Payer: Self-pay | Admitting: Physical Therapy

## 2023-05-01 ENCOUNTER — Other Ambulatory Visit: Payer: Self-pay

## 2023-05-01 ENCOUNTER — Ambulatory Visit (INDEPENDENT_AMBULATORY_CARE_PROVIDER_SITE_OTHER): Payer: 59 | Admitting: Physical Therapy

## 2023-05-01 DIAGNOSIS — M25562 Pain in left knee: Secondary | ICD-10-CM

## 2023-05-01 DIAGNOSIS — M25512 Pain in left shoulder: Secondary | ICD-10-CM | POA: Diagnosis not present

## 2023-05-01 DIAGNOSIS — M25561 Pain in right knee: Secondary | ICD-10-CM | POA: Diagnosis not present

## 2023-05-01 DIAGNOSIS — M6281 Muscle weakness (generalized): Secondary | ICD-10-CM

## 2023-05-01 DIAGNOSIS — G8929 Other chronic pain: Secondary | ICD-10-CM | POA: Diagnosis not present

## 2023-05-01 NOTE — Therapy (Addendum)
OUTPATIENT PHYSICAL THERAPY EVALUATION / DISCHARGE   Patient Name: Christine Wilson MRN: [id number] DOB:January 27, 1961, 62 y.o., female Today's Date: 05/01/2023  END OF SESSION:  PT End of Session - 05/01/23 1021     Visit Number 1    Number of Visits 6    Date for PT Re-Evaluation 06/12/23    Authorization Type [employer]    PT Start Time 0930    PT Stop Time 1010    PT Time Calculation (min) 40 min    Behavior During Therapy [facility] for tasks assessed/performed             Past Medical History:  Diagnosis Date   CAD in native artery 06/26/2021   CHF (congestive heart failure) (HCC) 05/31/2016   [facility]   Chronic diastolic heart failure (HCC) 06/23/2020   Dyspnea on exertion 06/01/2016   Elevated alkaline phosphatase level 11/04/2019   Hypertension    Pure hypercholesterolemia 06/26/2021   Past Surgical History:  Procedure Laterality Date   CARDIAC CATHETERIZATION N/A 06/04/2016   Procedure: Right/Left Heart Cath and Coronary Angiography;  Surgeon: Dolores Patty, MD;  Location: [facility] INVASIVE CV LAB;  Service: Cardiovascular;  Laterality: N/A;   CESAREAN SECTION     Patient Active Problem List   Diagnosis Date Noted   Chronic left shoulder pain 04/12/2023   Tinea pedis of left foot 04/12/2023   Hyperlipidemia associated with type 2 diabetes mellitus (HCC) 06/26/2021   CAD in native artery 06/26/2021   Vitamin D deficiency 11/25/2020   Stage 3a chronic kidney disease (HCC) 11/18/2020   Chronic diastolic heart failure (HCC) 06/23/2020   DM (diabetes mellitus) (HCC) 11/04/2019   HTN (hypertension), benign 06/25/2019   Microcytic anemia 06/01/2016    PCP: Anne Ng, NP   REFERRING PROVIDER: Rodolph Bong, MD  REFERRING DIAG: [phone] (ICD-10-CM) - Chronic left shoulder pain M25.561,M25.562,G89.29 (ICD-10-CM) - Chronic pain of both knees  THERAPY DIAG:  Chronic left shoulder pain  Chronic pain of left knee  Chronic pain of right  knee  Muscle weakness (generalized)  Rationale for Evaluation and Treatment: Rehabilitation  ONSET DATE: 2 month onset of shoulder pain  SUBJECTIVE:                                                                                                                                                                                      SUBJECTIVE STATEMENT: Pt reports shoulder pain x 2-3 months radiating into the upper arm without known cause or injury. Pt is RHD but uses left hand for repetitive tasks. She does say her shoulder is getting a little better on its on. Pain is worse at night.  She denies N/T or burning  Hand dominance: Right  PERTINENT HISTORY: DM, CHF, CAD  PAIN:  NPRS scale: 3/10 upon arrival and can get to 7 for shoulder, her knee pain is 5/10 Pain location:left lateral-posterior shoulder, anterior knee pain bilat Pain description: sharp intermittent Aggravating factors: for shoulder: sleeping, lifting, reaching, for knees it is squatting Relieving factors: rest, ointments, has not tried heat or ice   PRECAUTIONS: None  RED FLAGS: None   WEIGHT BEARING RESTRICTIONS: No  FALLS:  Has patient fallen in last 6 months? No  OCCUPATION: Part time work Conservation officer, [employer]  PLOF: Independent  PATIENT GOALS:reduce pain  NEXT MD VISIT:   OBJECTIVE:  Note: Objective measures were completed at Evaluation unless otherwise noted.  DIAGNOSTIC FINDINGS:  "Diagnostic Limited MSK Ultrasound of: Left shoulder Biceps tendon intact appearing. Subscapularis tendon is intact appearing. Supraspinatus tendon no visible tear or retraction intact appearing with mild subacromial bursitis present. Infraspinatus tendon appears to be intact. AC joint effusion is present. Impression: Subacromial bursitis" "X-ray images left shoulder obtained today personally and independently interpreted No significant glenohumeral or AC DJD.  No acute fractures are visible."  PATIENT SURVEYS:  Eval:FOTO 66%  functional, goal is 72%      SENSATION: WFL   ROM:   Active ROM Right eval Left eval  Shoulder flexion 140 120  Shoulder extension    Shoulder abduction 130 105  Shoulder adduction    Shoulder internal rotation  40  Shoulder external rotation  50  Elbow flexion    Elbow extension        Knee flexion 130 125   Knee extension 0 0  (Blank rows = not tested)  STRENGTH MMT:  MMT Right eval Left eval  Shoulder flexion  5  Shoulder extension    Shoulder abduction  4  Shoulder adduction    Shoulder internal rotation  5  Shoulder external rotation  4+  Middle trapezius    Lower trapezius    Elbow flexion  5  Elbow extension  5  Knee flexion 5 5  Knee extension 4+ 4+      (Blank rows = not tested)  SHOULDER SPECIAL TESTS: Impingement tests: Neer impingement test: positive , Hawkins/Kennedy impingement test: positive , and Painful arc test: positive  GH hypomobility consistent with early adhesive capsulitis   TODAY'S TREATMENT:  Eval HEP creation and review with demonstration and trial set preformed, see below for details   PATIENT EDUCATION: Education details: HEP, PT plan of care Person educated: Patient Education method: Explanation, Demonstration, Verbal cues, and Handouts Education comprehension: verbalized understanding and needs further education   HOME EXERCISE PROGRAM: Access Code: [email or web address] URL: [email or web address] Date: 05/01/2023 Prepared by: Ivery Quale  Exercises - Circular Shoulder Pendulum with Table Support  - 3 x daily - 7 x weekly - 2 sets - 10 reps - Supine Shoulder Flexion Extension AAROM with Dowel  - 3 x daily - 7 x weekly - 1 sets - 10-15 reps - 5 sec hold - Supine Shoulder External Rotation in 45 Degrees Abduction AAROM with Dowel  - 3 x daily - 7 x weekly - 1 sets - 10-15 reps - Supine Shoulder Abduction AAROM with Dowel  - 2 x daily - 6 x weekly - 1 sets - 10-15 reps - Standing Shoulder Internal Rotation Stretch  with Towel  - 3 x daily - 7 x weekly - 1 sets - 10 reps - 5 sec hold - Standing Shoulder Scaption Wall Walk  - 3  x daily - 7 x weekly - 1 sets - 10 reps - 5 hold - Seated Straight Leg Raise with Quad Contraction  - 2 x daily - 6 x weekly - 3 sets - 10 reps - Seated Knee Flexion Stretch  - 2 x daily - 6 x weekly - 1-2 sets - 10 reps - 5 sec hold - Sit to Stand Without Arm Support  - 2 x daily - 6 x weekly - 1-2 sets - 10 reps  ASSESSMENT:  CLINICAL IMPRESSION: Patient referred to PT for  Primarily left shoulder pain thought to be due to subacromial impingement and bursitis. She has some GH hypomobility consistent with early hypomobility. She has secondary bilateral chronic knee pain. Patient will benefit from skilled PT to address below impairments, limitations and improve overall function. At this time she wants to trial HEP and will follow up with PT if needed within 30 days. We will DC after 30 days if no return.   OBJECTIVE IMPAIRMENTS: decreased activity tolerance, decreased shoulder mobility, decreased ROM, decreased strength, impaired flexibility, impaired UE use, and pain.  ACTIVITY LIMITATIONS: reaching, lifting, carry,  cleaning, squatting and or occupation  PERSONAL FACTORS:  DM also affecting patient's functional outcome.  REHAB POTENTIAL: Good  CLINICAL DECISION MAKING: Stable/uncomplicated  EVALUATION COMPLEXITY: Low    GOALS: Short term PT Goals Target date: 05/29/2023   Pt will be I and compliant with HEP. Baseline:  Goal status: New Pt will decrease pain by 25% overall Baseline: Goal status: New  Long term PT goals Target date:06/12/2023   Pt will improve Lt shoulder AROM to [facility] to improve functional reaching Baseline: Goal status: New Pt will improve  Lt shoulder strength and bilat knee strength to at least 5/5 MMT to improve functional strength Baseline: Goal status: New Pt will improve FOTO to at least 72% functional to show improved  function Baseline: Goal status: New Pt will reduce pain to overall less than 3/10 with usual activity and work activity. Baseline: Goal status: New  PLAN: PT FREQUENCY: 1-2 times per week   PT DURATION: 6 weeks  PLANNED INTERVENTIONS (unless contraindicated): aquatic PT, Canalith repositioning, cryotherapy, Electrical stimulation, Iontophoresis with 4 mg/ml dexamethasome, Moist heat, traction, Ultrasound, gait training, Therapeutic exercise, balance training, neuromuscular re-education, patient/family education, prosthetic training, manual techniques, passive ROM, dry needling, taping, vasopnuematic device, vestibular, spinal manipulations, joint manipulations  PLAN FOR NEXT SESSION: review HEP GH mobility focus for left shoulder, DC after 30 days if no return.    April Manson, PT,DPT 05/01/2023, 10:23 AM   PHYSICAL THERAPY DISCHARGE SUMMARY  Visits from Start of Care: 1  Current functional level related to goals / functional outcomes: See note   Remaining deficits: See note   Education / Equipment: HEP  Patient goals were partially met. Patient is being discharged due to not returning since the last visit.  Chyrel Masson, PT, DPT, OCS, ATC 07/19/23  9:55 AM

## 2023-05-06 NOTE — Progress Notes (Signed)
Left shoulder x-ray shows arthritis at the small joint at the top of the shoulder.  This is the acromioclavicular or AC joint.

## 2023-05-09 LAB — HM DIABETES EYE EXAM

## 2023-05-15 ENCOUNTER — Telehealth: Payer: Self-pay

## 2023-05-15 NOTE — Telephone Encounter (Signed)
Results received. Report sent to HIM to be scanned into pt chart. Provider notified. Pt notified - no further questions or concerns at this time.

## 2023-05-16 ENCOUNTER — Encounter: Payer: Self-pay | Admitting: Nurse Practitioner

## 2023-05-16 NOTE — Telephone Encounter (Signed)
Eye exam abstracted. Dm/cma

## 2023-05-19 ENCOUNTER — Other Ambulatory Visit: Payer: Self-pay | Admitting: Cardiovascular Disease

## 2023-06-10 ENCOUNTER — Other Ambulatory Visit: Payer: Self-pay | Admitting: Cardiovascular Disease

## 2023-06-14 ENCOUNTER — Other Ambulatory Visit: Payer: Self-pay | Admitting: Nurse Practitioner

## 2023-06-14 ENCOUNTER — Telehealth (HOSPITAL_BASED_OUTPATIENT_CLINIC_OR_DEPARTMENT_OTHER): Payer: Self-pay | Admitting: Cardiovascular Disease

## 2023-06-14 ENCOUNTER — Other Ambulatory Visit: Payer: Self-pay | Admitting: Cardiovascular Disease

## 2023-06-14 DIAGNOSIS — E559 Vitamin D deficiency, unspecified: Secondary | ICD-10-CM

## 2023-06-14 DIAGNOSIS — I160 Hypertensive urgency: Secondary | ICD-10-CM

## 2023-06-14 DIAGNOSIS — I5041 Acute combined systolic (congestive) and diastolic (congestive) heart failure: Secondary | ICD-10-CM

## 2023-06-14 MED ORDER — ENTRESTO 97-103 MG PO TABS: 1.0000 | ORAL_TABLET | Freq: Two times a day (BID) | ORAL | 0 refills | Status: DC

## 2023-06-14 NOTE — Telephone Encounter (Signed)
*  STAT* If patient is at the pharmacy, call can be transferred to refill team.   1. Which medications need to be refilled? (please list name of each medication and dose if known)   sacubitril-valsartan (ENTRESTO) 97-103 MG   2. Would you like to learn more about the convenience, safety, & potential cost savings by using the [facility]?   3. Are you open to using the [facility] (Type [facility]. ).  4. Which pharmacy/location (including street and city if local pharmacy) is medication to be sent to?  [id number] - [place], [place] - [address]   5. Do they need a 30 day or 90 day supply?   90 day  Patient stated she is almost out of this medication.

## 2023-06-17 ENCOUNTER — Ambulatory Visit
Admission: RE | Admit: 2023-06-17 | Discharge: 2023-06-17 | Disposition: A | Discharge status: Home / Self Care | Payer: 59 | Source: Ambulatory Visit | Attending: Nurse Practitioner | Admitting: Nurse Practitioner

## 2023-06-17 DIAGNOSIS — Z1231 Encounter for screening mammogram for malignant neoplasm of breast: Secondary | ICD-10-CM

## 2023-07-05 ENCOUNTER — Other Ambulatory Visit (HOSPITAL_BASED_OUTPATIENT_CLINIC_OR_DEPARTMENT_OTHER): Payer: Self-pay | Admitting: Cardiovascular Disease

## 2023-07-05 ENCOUNTER — Other Ambulatory Visit: Payer: Self-pay | Admitting: Nurse Practitioner

## 2023-07-05 DIAGNOSIS — E559 Vitamin D deficiency, unspecified: Secondary | ICD-10-CM

## 2023-07-05 DIAGNOSIS — I160 Hypertensive urgency: Secondary | ICD-10-CM

## 2023-07-05 DIAGNOSIS — I5041 Acute combined systolic (congestive) and diastolic (congestive) heart failure: Secondary | ICD-10-CM

## 2023-07-11 ENCOUNTER — Other Ambulatory Visit (HOSPITAL_BASED_OUTPATIENT_CLINIC_OR_DEPARTMENT_OTHER): Payer: Self-pay

## 2023-07-11 MED ORDER — ATORVASTATIN CALCIUM 40 MG PO TABS
40.0000 mg | ORAL_TABLET | Freq: Every day | ORAL | 0 refills | Status: DC
  Filled 2023-07-11: qty 30, 30d supply, fill #0

## 2023-07-12 ENCOUNTER — Ambulatory Visit (INDEPENDENT_AMBULATORY_CARE_PROVIDER_SITE_OTHER): Payer: 59 | Admitting: Nurse Practitioner

## 2023-07-12 ENCOUNTER — Encounter: Payer: Self-pay | Admitting: Nurse Practitioner

## 2023-07-12 ENCOUNTER — Telehealth: Payer: Self-pay | Admitting: Cardiovascular Disease

## 2023-07-12 VITALS — BP 130/71 | HR 74 | Temp 98.1°F | Resp 18 | Ht 61.0 in | Wt 159.4 lb

## 2023-07-12 DIAGNOSIS — E1165 Type 2 diabetes mellitus with hyperglycemia: Secondary | ICD-10-CM | POA: Diagnosis not present

## 2023-07-12 DIAGNOSIS — N1831 Chronic kidney disease, stage 3a: Secondary | ICD-10-CM

## 2023-07-12 DIAGNOSIS — Z794 Long term (current) use of insulin: Secondary | ICD-10-CM | POA: Diagnosis not present

## 2023-07-12 DIAGNOSIS — I10 Essential (primary) hypertension: Secondary | ICD-10-CM | POA: Diagnosis not present

## 2023-07-12 DIAGNOSIS — R809 Proteinuria, unspecified: Secondary | ICD-10-CM | POA: Diagnosis not present

## 2023-07-12 DIAGNOSIS — E1169 Type 2 diabetes mellitus with other specified complication: Secondary | ICD-10-CM | POA: Diagnosis not present

## 2023-07-12 LAB — POCT GLYCOSYLATED HEMOGLOBIN (HGB A1C): Hemoglobin A1C: 6.9 % — AB (ref 4.0–5.6)

## 2023-07-12 MED ORDER — METFORMIN HCL ER 500 MG PO TB24: 500.0000 mg | ORAL_TABLET | Freq: Every day | ORAL | 1 refills | Status: DC

## 2023-07-12 NOTE — Assessment & Plan Note (Signed)
Repeat hgbA1c: 6.9% Controlled DIABETES with metformin Positive UACr: current use of entresto. Will repeat UACr in 54months LDL at goal with lipitor  F/up in 54month

## 2023-07-12 NOTE — Telephone Encounter (Signed)
Christine Wilson is calling to speak with a nurse about all medications the pt is on because it shows they are out of date. Please advise

## 2023-07-12 NOTE — Progress Notes (Signed)
Established Patient Visit  Patient: Christine Wilson   DOB: 06-Mar-1961   62 y.o. Female  MRN: [id number] Visit Date: 07/12/2023  Subjective:    Chief Complaint  Patient presents with   OFFICE VISIT     3 month follow up. RX refill request for amlodipine 5 mg    Hyperlipidemia   Hypertension   Diabetes   Hyperlipidemia  Hypertension  Diabetes   HTN (hypertension), benign Med discrepancy noted Sent message to cardiology. BP at goal BP Readings from Last 3 Encounters:  07/12/23 130/71  04/16/23 132/78  04/12/23 130/78     DM (diabetes mellitus) (HCC) Repeat hgbA1c: 6.9% Controlled DIABETES with metformin Positive UACr: current use of entresto. Will repeat UACr in 15months LDL at goal with lipitor  F/up in 15month  Wt Readings from Last 3 Encounters:  07/12/23 159 lb 6.4 oz (72.3 kg)  04/16/23 160 lb (72.6 kg)  04/12/23 158 lb 9.6 oz (71.9 kg)    Reviewed medical, surgical, and social history today  Medications: Outpatient Medications Prior to Visit  Medication Sig Note   amLODipine (NORVASC) 5 MG tablet TAKE 1 TABLET BY MOUTH DAILY *PLEASE SCHEDULE APPOINTMENT FOR REFILLS 07/12/2023: Refill is needed    Ascorbic Acid (VITAMIN C) 1000 MG tablet Take 1,000 mg by mouth daily.    aspirin 81 MG EC tablet Take 1 tablet (81 mg total) by mouth daily.    atorvastatin (LIPITOR) 40 MG tablet Take 1 tablet (40 mg total) by mouth daily. Please keep upcoming appointment for further refills.    BIOTIN FORTE PO Take 1 tablet by mouth daily as needed (takes occassionaly).     blood glucose meter kit and supplies KIT Dispense based on patient and insurance preference. Check glucose once a day before breakfast. E11.65    blood glucose meter kit and supplies Dispense based on patient and insurance preference. Check once a day in AM. E11.65    carvedilol (COREG) 6.25 MG tablet TAKE 1 TABLET(6.25 MG) BY MOUTH TWICE DAILY WITH A MEAL    glucose blood (CONTOUR NEXT TEST)  test strip USE ONCE DAILY IN THE MORNING    hydrALAZINE (APRESOLINE) 50 MG tablet TAKE 1.5 TABLETS BY MOUTH 3 TIMES DAILY. 2ND ATTEMPT. PT NEEDS YEARLY APPOINTMENT    isosorbide dinitrate (ISORDIL) 40 MG tablet Take 1 tablet (40 mg total) by mouth 3 (three) times daily. NEED APPOINTMENT    metFORMIN (GLUCOPHAGE-XR) 500 MG 24 hr tablet Take 1 tablet (500 mg total) by mouth daily with breakfast.    miconazole (MICOTIN) 2 % powder Apply topically 2 (two) times daily.    Microlet Lancets MISC USE ONCE DAILY IN THE MORNING    sacubitril-valsartan (ENTRESTO) 97-103 MG Take 1 tablet by mouth 2 (two) times daily. Please call [phone] to schedule overdue appointment for further refills.    spironolactone (ALDACTONE) 25 MG tablet TAKE 1 TABLET(25 MG) BY MOUTH DAILY    terbinafine (LAMISIL) 1 % cream Apply 1 Application topically 2 (two) times daily.    Vitamin D, Ergocalciferol, (DRISDOL) 1.25 MG (50000 UNIT) CAPS capsule Take 1 capsule (50,000 Units total) by mouth every 7 (seven) days.    No facility-administered medications prior to visit.   Reviewed past medical and social history.   ROS per HPI above      Objective:  BP 130/71 (BP Location: Left Arm, Patient Position: Sitting, Cuff Size: Large)   Pulse 74  Temp 98.1 F (36.7 C) (Temporal)   Resp 18   Ht 5\' 1"  (1.549 m)   Wt 159 lb 6.4 oz (72.3 kg)   SpO2 100%   BMI 30.12 kg/m      Physical Exam Cardiovascular:     Rate and Rhythm: Normal rate and regular rhythm.     Pulses: Normal pulses.     Heart sounds: Normal heart sounds.  Pulmonary:     Effort: Pulmonary effort is normal.     Breath sounds: Normal breath sounds.  Musculoskeletal:     Right lower leg: No edema.     Left lower leg: No edema.  Neurological:     Mental Status: She is alert and oriented to person, place, and time.     Results for orders placed or performed in visit on 07/12/23  POCT glycosylated hemoglobin (Hb A1C)  Result Value Ref Range    Hemoglobin A1C 6.9 (A) 4.0 - 5.6 %   HbA1c POC (<> result, manual entry)     HbA1c, POC (prediabetic range)     HbA1c, POC (controlled diabetic range)        Assessment & Plan:    Problem List Items Addressed This Visit     DM (diabetes mellitus) (HCC) - Primary   Repeat hgbA1c: 6.9% Controlled DIABETES with metformin Positive UACr: current use of entresto. Will repeat UACr in 81months LDL at goal with lipitor  F/up in 81month      Relevant Orders   POCT glycosylated hemoglobin (Hb A1C) (Completed)   HTN (hypertension), benign   Med discrepancy noted Sent message to cardiology. BP at goal BP Readings from Last 3 Encounters:  07/12/23 130/71  04/16/23 132/78  04/12/23 130/78         Stage 3a chronic kidney disease (HCC)   Other Visit Diagnoses       Microalbuminuria          Return in about 3 months (around 10/10/2023) for HTN, DM, hyperlipidemia (fasting).     Alysia Penna, NP

## 2023-07-12 NOTE — Patient Instructions (Addendum)
DIABETES controlled with hgbA1c at 6.9% Maintain current medications, low sugar/low carb diet , and daily exercise

## 2023-07-12 NOTE — Telephone Encounter (Signed)
Spoke with [facility], they are concerned about what medications the patient is actually taking. Some of the medications on her list have not been refilled since 2022. Left message for patient asking her to bring all of her medication bottle to her follow up appointment 07/19/23. She is to call back with questions.

## 2023-07-12 NOTE — Assessment & Plan Note (Signed)
Med discrepancy noted Sent message to cardiology. BP at goal BP Readings from Last 3 Encounters:  07/12/23 130/71  04/16/23 132/78  04/12/23 130/78

## 2023-07-19 ENCOUNTER — Ambulatory Visit (INDEPENDENT_AMBULATORY_CARE_PROVIDER_SITE_OTHER): Payer: 59 | Admitting: Family

## 2023-07-19 ENCOUNTER — Encounter (HOSPITAL_BASED_OUTPATIENT_CLINIC_OR_DEPARTMENT_OTHER): Payer: Self-pay | Admitting: Family

## 2023-07-19 ENCOUNTER — Other Ambulatory Visit (HOSPITAL_BASED_OUTPATIENT_CLINIC_OR_DEPARTMENT_OTHER): Payer: Self-pay

## 2023-07-19 VITALS — BP 136/62 | HR 90 | Ht 61.0 in | Wt 157.8 lb

## 2023-07-19 DIAGNOSIS — I10 Essential (primary) hypertension: Secondary | ICD-10-CM | POA: Diagnosis not present

## 2023-07-19 DIAGNOSIS — I25118 Atherosclerotic heart disease of native coronary artery with other forms of angina pectoris: Secondary | ICD-10-CM

## 2023-07-19 DIAGNOSIS — E785 Hyperlipidemia, unspecified: Secondary | ICD-10-CM | POA: Diagnosis not present

## 2023-07-19 DIAGNOSIS — I5042 Chronic combined systolic (congestive) and diastolic (congestive) heart failure: Secondary | ICD-10-CM | POA: Diagnosis not present

## 2023-07-19 MED ORDER — CARVEDILOL 6.25 MG PO TABS: 6.2500 mg | ORAL_TABLET | Freq: Two times a day (BID) | ORAL | 3 refills | Status: DC

## 2023-07-19 MED ORDER — ENTRESTO 97-103 MG PO TABS: 1.0000 | ORAL_TABLET | Freq: Two times a day (BID) | ORAL | 3 refills | Status: DC

## 2023-07-19 MED ORDER — HYDRALAZINE HCL 50 MG PO TABS: 50.0000 mg | ORAL_TABLET | Freq: Two times a day (BID) | ORAL | 3 refills | Status: DC

## 2023-07-19 MED ORDER — AMLODIPINE BESYLATE 5 MG PO TABS: 5.0000 mg | ORAL_TABLET | Freq: Every day | ORAL | 3 refills | Status: AC

## 2023-07-19 MED ORDER — ATORVASTATIN CALCIUM 40 MG PO TABS: 40.0000 mg | ORAL_TABLET | Freq: Every day | ORAL | 1 refills | Status: DC

## 2023-07-19 NOTE — Progress Notes (Signed)
Cardiology Office Note:  .   Date:  07/19/2023  ID:  Christine Wilson, DOB November 17, 1960, MRN [id number] PCP: Anne Ng, NP  [facility] Providers Cardiologist:  Chilton Si, MD    History of Present Illness: .   Christine Wilson is a 62 y.o. 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  Developed acute systolic and diastolic heart failure with LVEF 15 to 20% in November 2017 felt to be hypertensive cardiomyopathy with BP at that time 205/120.  CTA chest negative for PE.  R/LHC moderate nonobstructive CAD with 60% LAD lesion.  Cardiac MRI with diffuse hypokinesis, LVEF 22%, normal RV function.  Entresto, spironolactone, digoxin initiated.  Repeat echo 08/2018 normalization LVEF 60 to 65% with grade 1 diastolic dysfunction.  Last seen 08/28/2021 with repeat echo 10/2021 normal LVEF, trivial MR, moderate TR.  Presents today for follow up. Pleasant lady who works at [employer]. No formal exercise routine but active at work. Has been trying to eat at home more often. Reports no shortness of breath nor dyspnea on exertion. Reports no chest pain, pressure, or tightness. No edema, orthopnea, PND. Reports no palpitations.  Occasional lightheadedness with bending over but no near syncope, syncope. Blood pressure at home 130s/60s. Has not taken carvedilol, isordil, or spironolactone in some time as had difficulties with refills after transitioning pharmacy.   ROS: Please see the history of present illness.    All other systems reviewed and are negative.   Studies Reviewed: .        Cardiac Studies & Procedures   CARDIAC CATHETERIZATION  CARDIAC CATHETERIZATION 06/04/2016  Narrative  Dist LAD lesion, 60 %stenosed.  Findings:  Ao = 141/85 (105) LV =  141/11 RA = 4 RV = 29/1 PA = 34/12 (21) PCW = 8 Fick cardiac output/index = 2.6/1.6 Thermo CO/CI = 2.9/1.8 PVR = 5.0 WU SVR = 3087 FA  sat = 95% PA sat = 42%, 44%  Assessment: 1. 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 Markedly depressed CO with severely elevated SVR 4. Low filling pressures  Plan/Discussion:  Doubt LAD lesion is flow limiting. Suspect severe NICM due to HTN. SVR remains very high. Stop b-blocker with low output. Continue aggressive afterload reduction.  Bensimhon, Daniel,MD 9:45 AM  Findings Coronary Findings Diagnostic  Dominance: Right  Left Main Vessel is angiographically normal.  Left Anterior Descending  Left Circumflex Vessel is angiographically normal.  Right Coronary Artery Vessel is angiographically normal.  Intervention  No interventions have been documented.    ECHOCARDIOGRAM  ECHOCARDIOGRAM COMPLETE 11/15/2021  Narrative ECHOCARDIOGRAM REPORT    Patient Name:   Christine Wilson Date of Exam: 11/15/2021 Medical Rec #:  [id number]     Height:       61.0 in Accession #:    [id number]    Weight:       156.0 lb Date of Birth:  [birth year]    BSA:          1.699 m Patient Age:    60 years      BP:           122/62 mmHg Patient Gender: F             HR:           59 bpm. Exam Location:  [address]  Procedure: 2D Echo, Cardiac Doppler and Color Doppler  Indications:  I50.32 CHF  History:        Patient has prior history of Echocardiogram examinations, most recent 08/27/2018. CHF, CAD, Signs/Symptoms:Shortness of Breath; Risk Factors:Hypertension and Dyslipidemia.  Sonographer:    Samule Ohm RDCS Referring Phys: [id number] TESSA N CONTE  IMPRESSIONS   1. Left ventricular ejection fraction, by estimation, is 60 to 65%. The left ventricle has normal function. The left ventricle has no regional wall motion abnormalities. Left ventricular diastolic parameters were normal. 2. Right ventricular systolic function is normal. The right ventricular size is normal. There is normal pulmonary artery systolic  pressure. The estimated right ventricular systolic pressure is 31.9 mmHg. 3. The mitral valve is normal in structure. Trivial mitral valve regurgitation. No evidence of mitral stenosis. 4. Tricuspid valve regurgitation is moderate. 5. The aortic valve is tricuspid. There is mild thickening of the aortic valve. Aortic valve regurgitation is not visualized. Aortic valve sclerosis is present, with no evidence of aortic valve stenosis. 6. The inferior vena cava is normal in size with greater than 50% respiratory variability, suggesting right atrial pressure of 3 mmHg.  Comparison(s): Compared to prior TTE in 08/2018, the TR now appears moderate (previously mild). Otherwise, there is no significant change.  FINDINGS Left Ventricle: Left ventricular ejection fraction, by estimation, is 60 to 65%. The left ventricle has normal function. The left ventricle has no regional wall motion abnormalities. The left ventricular internal cavity size was normal in size. There is no left ventricular hypertrophy. Left ventricular diastolic parameters were normal. Normal left ventricular filling pressure.  Right Ventricle: The right ventricular size is normal. No increase in right ventricular wall thickness. Right ventricular systolic function is normal. There is normal pulmonary artery systolic pressure. The tricuspid regurgitant velocity is 2.69 m/s, and with an assumed right atrial pressure of 3 mmHg, the estimated right ventricular systolic pressure is 31.9 mmHg.  Left Atrium: Left atrial size was normal in size.  Right Atrium: Right atrial size was normal in size.  Pericardium: There is no evidence of pericardial effusion.  Mitral Valve: The mitral valve is normal in structure. There is mild thickening of the mitral valve leaflet(s). Trivial mitral valve regurgitation. No evidence of mitral valve stenosis.  Tricuspid Valve: The tricuspid valve is normal in structure. Tricuspid valve regurgitation is  moderate.  Aortic Valve: The aortic valve is tricuspid. There is mild thickening of the aortic valve. Aortic valve regurgitation is not visualized. Aortic valve sclerosis is present, with no evidence of aortic valve stenosis.  Pulmonic Valve: The pulmonic valve was normal in structure. Pulmonic valve regurgitation is trivial.  Aorta: The aortic root and ascending aorta are structurally normal, with no evidence of dilitation.  Venous: The inferior vena cava is normal in size with greater than 50% respiratory variability, suggesting right atrial pressure of 3 mmHg.  IAS/Shunts: The atrial septum is grossly normal.   LEFT VENTRICLE PLAX 2D LVIDd:         3.60 cm   Diastology LVIDs:         1.90 cm   LV e' medial:    9.25 cm/s LV PW:         1.00 cm   LV E/e' medial:  9.8 LV IVS:        1.10 cm   LV e' lateral:   12.20 cm/s LVOT diam:     1.80 cm   LV E/e' lateral: 7.4 LV SV:         59 LV SV Index:   35  LVOT Area:     2.54 cm   RIGHT VENTRICLE             IVC RV S prime:     16.30 cm/s  IVC diam: 0.90 cm TAPSE (M-mode): 1.7 cm RVSP:           31.9 mmHg  LEFT ATRIUM           Index        RIGHT ATRIUM           Index LA diam:      3.20 cm 1.88 cm/m   RA Pressure: 3.00 mmHg LA Vol (A2C): 43.2 ml 25.42 ml/m  RA Area:     10.20 cm LA Vol (A4C): 33.9 ml 19.95 ml/m  RA Volume:   25.40 ml  14.95 ml/m AORTIC VALVE LVOT Vmax:   103.00 cm/s LVOT Vmean:  69.500 cm/s LVOT VTI:    0.231 m  AORTA Ao Root diam: 2.50 cm Ao Asc diam:  2.70 cm  MITRAL VALVE               TRICUSPID VALVE MV Area (PHT): 2.83 cm    TR Peak grad:   28.9 mmHg MV Decel Time: 268 msec    TR Vmax:        269.00 cm/s MV E velocity: 90.70 cm/s  Estimated RAP:  3.00 mmHg MV A velocity: 85.40 cm/s  RVSP:           31.9 mmHg MV E/A ratio:  1.06 SHUNTS Systemic VTI:  0.23 m Systemic Diam: 1.80 cm  Laurance Flatten MD Electronically signed by Laurance Flatten MD Signature Date/Time: 11/15/2021/4:40:53  PM    Final     CARDIAC MRI  MR CARDIAC MORPHOLOGY W WO CONTRAST 06/05/2016  Narrative CLINICAL DATA:  Cardiomhyopathy  EXAM: CARDIAC MRI  TECHNIQUE: The patient was scanned on a 1.5 Tesla GE magnet. A dedicated cardiac coil was used. Functional imaging was done using Fiesta sequences. 2,3, and 4 chamber views were done to assess for RWMA's. Modified Simpson's rule using a short axis stack was used to calculate an ejection fraction on a dedicated work [employer]. The patient received 23 cc of Multihance. After 10 minutes inversion recovery sequences were used to assess for infiltration and scar tissue.  CONTRAST:  23 cc Multihance  FINDINGS: Limited images of the lung fields showed no gross abnormalities.  Small pericardial effusion. Normal left ventricular size with mild LV hypertrophy. EF 22%, diffuse hypokinesis. Normal right ventricular size and systolic function. Mild left atrial enlargement. Normal right atrium. No significant mitral regurgitation noted. Trileaflet aortic valve without significant stenosis or regurgitation.  On delayed enhancement imaging, there was no myocardial late gadolinium enhancement (LGE) noted.  MEASUREMENTS: MEASUREMENTS LV EDV 167 mL  LV SV 36 mL  LV EF 22%  IMPRESSION: 1. Normal LV size with mild LV hypertrophy. EF 22% with diffuse hypokinesis.  2.  Normal RV size and systolic function.  3. No myocardial LGE, so no definitive evidence for prior MI, myocarditis, or infiltrative disease. Possible hypertensive cardiomyopathy.  Dalton Mclean   Electronically Signed By: Marca Ancona M.D. On: 06/05/2016 17:50         Risk Assessment/Calculations:             Physical Exam:   VS:  BP 136/62   Pulse 90   Ht 5\' 1"  (1.549 m)   Wt 157 lb 12.8 oz (71.6 kg)   SpO2 96%  BMI 29.82 kg/m    Wt Readings from Last 3 Encounters:  07/19/23 157 lb 12.8 oz (71.6 kg)  07/12/23 159 lb 6.4 oz (72.3 kg)   04/16/23 160 lb (72.6 kg)    GEN: Well nourished, well developed in no acute distress NECK: No JVD; No carotid bruits CARDIAC: RRR, no murmurs, rubs, gallops RESPIRATORY:  Clear to auscultation without rales, wheezing or rhonchi  ABDOMEN: Soft, non-tender, non-distended EXTREMITIES:  No edema; No deformity   ASSESSMENT AND PLAN: .    Chronic systolic and diastolic heart failure with recovered LVEF - Euvolemic and well compensated on exam. No evidence of recurrent heart failure. Continue Carvedilol, Hydralazine, Entresto. No indication for diuretic at this time.  Hypertension - BP not at goal. Not taking carvedilol, spironolactone, isordil due to refill difficulties. Resume Carvedilol 6.25mg  BID. Continue Amlodipine 5mg  daily, Hydralazine 50mg  BID, Entresto 97-103mg  BID. Refills provided. Discussed to monitor BP at home at least 2 hours after medications and sitting for 5-10 minutes. If BP Not at goal at follow up consider increased dose Carvedilol vs Amlodipine.   Nonobstructive CAD /HLD, LDL goal <70 - Stable with no anginal symptoms. No indication for ischemic evaluation.  Most recent LDL 73, encouraged continued dietary changes and increasing physical activity. GDMT aspirin, atorvastatin, carvedilol. Recommend aiming for 150 minutes of moderate intensity activity per week and following a heart healthy diet.    DM2 - Continue to follow with PCP.         Dispo: follow up in 6 months  Signed, Alver Sorrow, NP

## 2023-07-19 NOTE — Patient Instructions (Addendum)
Medication Instructions:  Your physician has recommended you make the following change in your medication:   RESUME Carvedilol   *If you need a refill on your cardiac medications before your next appointment, please call your pharmacy*   Follow-Up: At [facility], you and your health needs are our priority.  As part of our continuing mission to provide you with exceptional heart care, we have created designated Provider Care Teams.  These Care Teams include your primary Cardiologist (physician) and Advanced Practice Providers (APPs -  Physician Assistants and Nurse Practitioners) who all work together to provide you with the care you need, when you need it.  We recommend signing up for the patient portal called "MyChart".  Sign up information is provided on this After Visit Summary.  MyChart is used to connect with patients for Virtual Visits (Telemedicine).  Patients are able to view lab/test results, encounter notes, upcoming appointments, etc.  Non-urgent messages can be sent to your provider as well.   To learn more about what you can do with MyChart, go to [email or web address].    Your next appointment:   6 month(s)  Provider:   Chilton Si, MD    Other Instructions  Remain off Isordil and Spironolactone

## 2023-10-11 ENCOUNTER — Ambulatory Visit: Payer: 59 | Admitting: Nurse Practitioner

## 2023-10-11 ENCOUNTER — Encounter: Payer: Self-pay | Admitting: Nurse Practitioner

## 2023-10-11 VITALS — BP 130/72 | HR 68 | Temp 97.5°F | Ht 61.0 in | Wt 157.8 lb

## 2023-10-11 DIAGNOSIS — Z794 Long term (current) use of insulin: Secondary | ICD-10-CM

## 2023-10-11 DIAGNOSIS — E1169 Type 2 diabetes mellitus with other specified complication: Secondary | ICD-10-CM | POA: Diagnosis not present

## 2023-10-11 DIAGNOSIS — N1831 Chronic kidney disease, stage 3a: Secondary | ICD-10-CM | POA: Diagnosis not present

## 2023-10-11 DIAGNOSIS — Z23 Encounter for immunization: Secondary | ICD-10-CM | POA: Diagnosis not present

## 2023-10-11 DIAGNOSIS — I25118 Atherosclerotic heart disease of native coronary artery with other forms of angina pectoris: Secondary | ICD-10-CM

## 2023-10-11 DIAGNOSIS — I10 Essential (primary) hypertension: Secondary | ICD-10-CM | POA: Diagnosis not present

## 2023-10-11 DIAGNOSIS — E785 Hyperlipidemia, unspecified: Secondary | ICD-10-CM | POA: Diagnosis not present

## 2023-10-11 LAB — COMPREHENSIVE METABOLIC PANEL
ALT: 18 U/L (ref 0–35)
AST: 14 U/L (ref 0–37)
Albumin: 4.2 g/dL (ref 3.5–5.2)
Alkaline Phosphatase: 81 U/L (ref 39–117)
BUN: 11 mg/dL (ref 6–23)
CO2: 27 meq/L (ref 19–32)
Calcium: 9.1 mg/dL (ref 8.4–10.5)
Chloride: 105 meq/L (ref 96–112)
Creatinine, Ser: 0.82 mg/dL (ref 0.40–1.20)
GFR: 75.63 mL/min (ref >60.00)
Glucose, Bld: 149 mg/dL — ABNORMAL HIGH (ref 70–99)
Potassium: 4 meq/L (ref 3.5–5.1)
Sodium: 141 meq/L (ref 135–145)
Total Bilirubin: 0.3 mg/dL (ref 0.2–1.2)
Total Protein: 6.9 g/dL (ref 6.0–8.3)

## 2023-10-11 LAB — MICROALBUMIN / CREATININE URINE RATIO
Creatinine,U: 138.7 mg/dL
Microalb Creat Ratio: 61.4 mg/g — ABNORMAL HIGH (ref 0.0–30.0)
Microalb, Ur: 8.5 mg/dL — ABNORMAL HIGH (ref 0.0–1.9)

## 2023-10-11 LAB — LIPID PANEL
Cholesterol: 246 mg/dL — ABNORMAL HIGH (ref 0–200)
HDL: 74.9 mg/dL (ref >39.00)
LDL Cholesterol: 141 mg/dL — ABNORMAL HIGH (ref 0–99)
NonHDL: 171.26
Total CHOL/HDL Ratio: 3
Triglycerides: 151 mg/dL — ABNORMAL HIGH (ref 0.0–149.0)
VLDL: 30.2 mg/dL (ref 0.0–40.0)

## 2023-10-11 LAB — HEMOGLOBIN A1C: Hgb A1c MFr Bld: 7.2 % — ABNORMAL HIGH (ref 4.6–6.5)

## 2023-10-11 MED ORDER — METFORMIN HCL ER 500 MG PO TB24: 500.0000 mg | ORAL_TABLET | Freq: Every day | ORAL | 1 refills | Status: DC

## 2023-10-11 MED ORDER — ATORVASTATIN CALCIUM 40 MG PO TABS: 40.0000 mg | ORAL_TABLET | Freq: Every day | ORAL | 3 refills | Status: DC

## 2023-10-11 NOTE — Assessment & Plan Note (Addendum)
 Repeat lipid panel: Abnormal lipid panel with LDL not at goal. Change atorvastatin dose to 80mg . New rx sent. Repeat labs in 3months

## 2023-10-11 NOTE — Assessment & Plan Note (Signed)
 BP at goal with amlodipine, coreg, hydralazine, and entresto BP Readings from Last 3 Encounters:  10/11/23 130/72  07/19/23 136/62  07/12/23 130/71    Maintain med doses and f/up with cardiology Repeat CMP. F/up in 6months

## 2023-10-11 NOTE — Progress Notes (Signed)
 Established Patient Visit  Patient: Christine Wilson   DOB: Mar 01, 1961   63 y.o. Female  MRN: [id number] Visit Date: 10/11/2023  Subjective:    Chief Complaint  Patient presents with   Follow-up    3 month f/u   HPI HTN (hypertension), benign BP at goal with amlodipine, coreg, hydralazine, and entresto BP Readings from Last 3 Encounters:  10/11/23 130/72  07/19/23 136/62  07/12/23 130/71    Maintain med doses and f/up with cardiology Repeat CMP. F/up in 79months  DM (diabetes mellitus) (HCC) Repeat hgbA1c and UACr Controlled DIABETES with metformin current use of entresto LDL at goal with lipitor  Maintain med doses F/up in 79month  Hyperlipidemia associated with type 2 diabetes mellitus (HCC) Repeat lipid panel Maintain lipitor dose  Stage 3a chronic kidney disease (HCC) Repeat CMP and UACr  Wt Readings from Last 3 Encounters:  10/11/23 157 lb 12.8 oz (71.6 kg)  07/19/23 157 lb 12.8 oz (71.6 kg)  07/12/23 159 lb 6.4 oz (72.3 kg)    BP Readings from Last 3 Encounters:  10/11/23 130/72  07/19/23 136/62  07/12/23 130/71    Reviewed medical, surgical, and social history today  Medications: Outpatient Medications Prior to Visit  Medication Sig   amLODipine (NORVASC) 5 MG tablet Take 1 tablet (5 mg total) by mouth daily.   Ascorbic Acid (VITAMIN C) 1000 MG tablet Take 1,000 mg by mouth daily.   aspirin 81 MG EC tablet Take 1 tablet (81 mg total) by mouth daily.   BIOTIN FORTE PO Take 1 tablet by mouth daily as needed (takes occassionaly).   carvedilol (COREG) 6.25 MG tablet Take 1 tablet (6.25 mg total) by mouth 2 (two) times daily with a meal.   hydrALAZINE (APRESOLINE) 50 MG tablet Take 1 tablet (50 mg total) by mouth 2 (two) times daily.   miconazole (MICOTIN) 2 % powder Apply topically 2 (two) times daily.   sacubitril-valsartan (ENTRESTO) 97-103 MG Take 1 tablet by mouth 2 (two) times daily.   [DISCONTINUED] atorvastatin (LIPITOR) 40 MG tablet  Take 1 tablet (40 mg total) by mouth daily.   [DISCONTINUED] metFORMIN (GLUCOPHAGE-XR) 500 MG 24 hr tablet Take 1 tablet (500 mg total) by mouth daily with breakfast.   [DISCONTINUED] blood glucose meter kit and supplies KIT Dispense based on patient and insurance preference. Check glucose once a day before breakfast. E11.65 (Patient not taking: Reported on 10/11/2023)   [DISCONTINUED] blood glucose meter kit and supplies Dispense based on patient and insurance preference. Check once a day in AM. E11.65 (Patient not taking: Reported on 10/11/2023)   [DISCONTINUED] glucose blood (CONTOUR NEXT TEST) test strip USE ONCE DAILY IN THE MORNING (Patient not taking: Reported on 10/11/2023)   [DISCONTINUED] Microlet Lancets MISC USE ONCE DAILY IN THE MORNING (Patient not taking: Reported on 10/11/2023)   No facility-administered medications prior to visit.   Reviewed past medical and social history.   ROS per HPI above      Objective:  BP 130/72 (BP Location: Right Arm, Patient Position: Sitting, Cuff Size: Normal)   Pulse 68   Temp (!) 97.5 F (36.4 C) (Temporal)   Ht 5\' 1"  (1.549 m)   Wt 157 lb 12.8 oz (71.6 kg)   SpO2 99%   BMI 29.82 kg/m      Physical Exam Vitals and nursing note reviewed.  Cardiovascular:     Rate and Rhythm: Normal rate and regular rhythm.  Pulses: Normal pulses.     Heart sounds: Normal heart sounds.  Pulmonary:     Effort: Pulmonary effort is normal.     Breath sounds: Normal breath sounds.  Musculoskeletal:     Right lower leg: No edema.     Left lower leg: No edema.  Neurological:     Mental Status: She is alert and oriented to person, place, and time.     No results found for any visits on 10/11/23.    Assessment & Plan:    Problem List Items Addressed This Visit     DM (diabetes mellitus) (HCC)   Repeat hgbA1c and UACr Controlled DIABETES with metformin current use of entresto LDL at goal with lipitor  Maintain med doses F/up in 21month       Relevant Medications   atorvastatin (LIPITOR) 40 MG tablet   metFORMIN (GLUCOPHAGE-XR) 500 MG 24 hr tablet   Other Relevant Orders   Hemoglobin A1c   Microalbumin / creatinine urine ratio   Comprehensive metabolic panel   HTN (hypertension), benign - Primary   BP at goal with amlodipine, coreg, hydralazine, and entresto BP Readings from Last 3 Encounters:  10/11/23 130/72  07/19/23 136/62  07/12/23 130/71    Maintain med doses and f/up with cardiology Repeat CMP. F/up in 21months      Relevant Medications   atorvastatin (LIPITOR) 40 MG tablet   Other Relevant Orders   Comprehensive metabolic panel   Hyperlipidemia associated with type 2 diabetes mellitus (HCC)   Repeat lipid panel Maintain lipitor dose      Relevant Medications   atorvastatin (LIPITOR) 40 MG tablet   metFORMIN (GLUCOPHAGE-XR) 500 MG 24 hr tablet   Other Relevant Orders   Comprehensive metabolic panel   Lipid panel   Stage 3a chronic kidney disease (HCC)   Repeat CMP and UACr      Relevant Orders   Microalbumin / creatinine urine ratio   Comprehensive metabolic panel   Other Visit Diagnoses       Coronary artery disease of native artery of native heart with stable angina pectoris (HCC)       Relevant Medications   atorvastatin (LIPITOR) 40 MG tablet     Need for zoster vaccination       Relevant Orders   Zoster Recombinant (Shingrix ) (Completed)      Return in about 6 months (around 04/12/2024) for HTN, DM, hyperlipidemia (fasting).     Alysia Penna, NP

## 2023-10-11 NOTE — Assessment & Plan Note (Signed)
 Repeat CMP and UACr

## 2023-10-11 NOTE — Patient Instructions (Addendum)
 Go to lab Maintain Heart healthy diet and daily exercise. Maintain current medications. Schedule nurse visit for 2nd shingrix vaccine in 2months  Exercise Information for Aging Adults Staying physically active is important as you age. Physical activity and exercise can help in maintaining quality of life, health, physical function, and reducing falls. The four types of exercises that are best for older adults are endurance, strength, balance, and flexibility. Contact your health care provider before you start any exercise routine. Ask your health care provider what activities are safe for you. What are the risks? Risks associated with exercising include: Overdoing it. This may lead to sore muscles or fatigue. Falls. Injuries. Dehydration. How to do these exercises Endurance exercises Endurance (aerobic) exercises raise your breathing rate and heart rate. Increasing your endurance helps you do everyday tasks and stay healthy. By improving the health of your body system that includes your heart, lungs, and blood vessels (circulatory system), you may also delay or prevent diseases such as heart disease, diabetes, and weak bones (osteoporosis). Types of endurance exercises include: Sports. Indoor activities, such as using gym equipment, doing water aerobics, or dancing. Outdoor activities, such as biking or jogging. Tasks around the house, such as gardening, yard work, and heavy household chores like cleaning. Walking, such as hiking or walking around your neighborhood. When doing endurance exercises, make sure you: Are aware of your surroundings. Use safety equipment as directed. Dress in layers when exercising outdoors. Drink plenty of water to stay well hydrated. Build up endurance slowly. Start with 10 minutes at a time, and gradually build up to doing 30 minutes at a time. Unless your health care provider gave you different instructions, aim to exercise for a total of 150 minutes a week.  Spread out that time so you are working on endurance 3 or more days a week. Strength exercises Lifting, pulling, or pushing weights helps to strengthen muscles. Having stronger muscles makes it easier to do everyday activities, such as getting up from a chair, climbing stairs, carrying groceries, and playing with grandchildren. Strength exercises include arm and leg exercises that may be done: With weights. Without weights (using your own body weight). With a resistance band. When doing strength exercises: Move smoothly and steadily. Do not suddenly thrust or jerk the weights, the resistance band, or your body. Start with no weights or with light weights, and gradually add more weight over time. Eventually, aim to use weights that are hard or very hard for you to lift. This means that you are able to do 8 repetitions with the weight, and the last few repetitions are very challenging. Lift or push weights into position for 3 seconds, hold the position for 1 second, and then take 3 seconds to return to your starting position. Breathe out (exhale) during difficult movements, like lifting or pushing weights. Breathe in (inhale) to relax your muscles before the next repetition. Consider alternating arms or legs, especially when you first start strength exercises. Expect some slight muscle soreness after each session. Do strength exercises on 2 or more days a week, for 30 minutes at a time. Avoid exercising the same muscle groups two days in a row. For example, if you work on your leg muscles one day, work on your arm muscles the next day. When you can do two sets of 10-15 repetitions with a certain weight, increase the amount of weight. Balance exercises Balance exercises can help to prevent falls. Balance exercises include: Standing on one foot. Heel-to-toe walk. Balance walk.  Tai chi. Make sure you have something sturdy to hold onto while doing balance exercises, such as a sturdy chair. As your  balance improves, challenge yourself by holding on to the chair with one hand instead of two, and then with no hands. Trying exercises with your eyes closed also challenges your balance, but be sure to have a sturdy surface (like a countertop) close by in case you need it. Do balance exercises as often as you want, or as often as directed by your health care provider. Flexibility exercises  Flexibility exercises improve how far you can bend, straighten, move, or rotate parts of your body (range of motion). These exercises also help you do everyday activities such as getting dressed or reaching for objects. Flexibility exercises include stretching different parts of the body, and they may be done in a standing or seated position or on the floor. When stretching, make sure you: Keep a slight bend in your arms and legs. Avoid completely straightening ("locking") your joints. Do not stretch so far that you feel pain. You should feel a mild stretching feeling. You may try stretching farther as you become more flexible over time. Relax and breathe between stretches. Hold on to something sturdy for balance as needed. Hold each stretch for 10-30 seconds. Repeat each stretch 3-5 times. General safety tips Exercise in well-lit areas. Do not hold your breath during exercises or stretches. Warm up before exercising, and cool down after exercising. This can help prevent injury. Drink plenty of water during exercise or any activity that makes you sweat. If you are not sure if an exercise is safe for you, or you are not sure how to do an exercise, talk with your health care provider. This is especially important if you have had surgery on muscles, bones, or joints (orthopedic surgery). Where to find more information You can find more information about exercise for older adults from: Your local health department, fitness center, or community center. These facilities may have programs for aging adults. [employer] on Aging: https://walker.com/ National Council on Aging: www.ncoa.org Summary Staying physically active is important as you age. Doing endurance, strength, balance, and flexibility exercises can help in maintaining quality of life, health, physical function, and reducing falls. Make sure to contact your health care provider before you start any exercise routine. Ask your health care provider what activities are safe for you. This information is not intended to replace advice given to you by your health care provider. Make sure you discuss any questions you have with your health care provider. Document Revised: 11/21/2020 Document Reviewed: 11/21/2020 Elsevier Patient Education  2024 [employer].

## 2023-10-11 NOTE — Assessment & Plan Note (Addendum)
 Repeat hgbA1c and UACr: Increased in hgbA1c: 6.9 to 7.2% increase metformin dose to 500mg  BID. New rx sent Stable renal function, but Persistent elevated urine microalbumin ratio despite use of entresto. It is important to improve glucose control. Maintain low sugar/low carb diet Abnormal lipid panel with LDL not at goal. Change atorvastatin dose to 80mg . New rx sent. F/up qith me in 82month(fasting) instead of 6months

## 2023-10-17 ENCOUNTER — Encounter: Payer: Self-pay | Admitting: Nurse Practitioner

## 2023-10-17 MED ORDER — METFORMIN HCL ER 500 MG PO TB24: 500.0000 mg | ORAL_TABLET | Freq: Two times a day (BID) | ORAL | 1 refills | Status: AC

## 2023-10-17 MED ORDER — ATORVASTATIN CALCIUM 80 MG PO TABS
80.0000 mg | ORAL_TABLET | Freq: Every evening | ORAL | 1 refills | Status: DC
Start: 2023-10-17 — End: 2024-01-17

## 2023-10-17 NOTE — Addendum Note (Signed)
 Addended by: Alysia Penna L on: 10/17/2023 10:19 AM   Modules accepted: Orders

## 2024-01-15 ENCOUNTER — Encounter (HOSPITAL_BASED_OUTPATIENT_CLINIC_OR_DEPARTMENT_OTHER): Payer: Self-pay | Admitting: *Deleted

## 2024-01-17 ENCOUNTER — Ambulatory Visit: Admitting: Nurse Practitioner

## 2024-01-17 ENCOUNTER — Ambulatory Visit (INDEPENDENT_AMBULATORY_CARE_PROVIDER_SITE_OTHER): Payer: 59 | Admitting: Cardiovascular Disease

## 2024-01-17 ENCOUNTER — Encounter (HOSPITAL_BASED_OUTPATIENT_CLINIC_OR_DEPARTMENT_OTHER): Payer: Self-pay | Admitting: Cardiovascular Disease

## 2024-01-17 VITALS — BP 130/72 | HR 72 | Ht 61.0 in | Wt 159.0 lb

## 2024-01-17 DIAGNOSIS — E1169 Type 2 diabetes mellitus with other specified complication: Secondary | ICD-10-CM

## 2024-01-17 DIAGNOSIS — I5032 Chronic diastolic (congestive) heart failure: Secondary | ICD-10-CM | POA: Diagnosis not present

## 2024-01-17 DIAGNOSIS — I25118 Atherosclerotic heart disease of native coronary artery with other forms of angina pectoris: Secondary | ICD-10-CM

## 2024-01-17 DIAGNOSIS — E785 Hyperlipidemia, unspecified: Secondary | ICD-10-CM

## 2024-01-17 DIAGNOSIS — I10 Essential (primary) hypertension: Secondary | ICD-10-CM | POA: Diagnosis not present

## 2024-01-17 DIAGNOSIS — R400 Somnolence: Secondary | ICD-10-CM

## 2024-01-17 DIAGNOSIS — I2510 Atherosclerotic heart disease of native coronary artery without angina pectoris: Secondary | ICD-10-CM

## 2024-01-17 DIAGNOSIS — Z5181 Encounter for therapeutic drug level monitoring: Secondary | ICD-10-CM

## 2024-01-17 DIAGNOSIS — N1831 Chronic kidney disease, stage 3a: Secondary | ICD-10-CM

## 2024-01-17 DIAGNOSIS — R0683 Snoring: Secondary | ICD-10-CM

## 2024-01-17 MED ORDER — ATORVASTATIN CALCIUM 80 MG PO TABS: 80.0000 mg | ORAL_TABLET | Freq: Every evening | ORAL | 1 refills | Status: DC

## 2024-01-17 NOTE — Patient Instructions (Signed)
 Medication Instructions:  RESTART YOUR ATORVASTATIN    *If you need a refill on your cardiac medications before your next appointment, please call your pharmacy*  Lab Work: FASTING LP/CMET IN 2 TO 3 MONTHS   If you have labs (blood work) drawn today and your tests are completely normal, you will receive your results only by: MyChart Message (if you have MyChart) OR A paper copy in the mail If you have any lab test that is abnormal or we need to change your treatment, we will call you to review the results.  Testing/Procedures: [facility] HOME SLEEP STUDY   Follow-Up: At [facility], you and your health needs are our priority.  As part of our continuing mission to provide you with exceptional heart care, our providers are all part of one team.  This team includes your primary Cardiologist (physician) and Advanced Practice Providers or APPs (Physician Assistants and Nurse Practitioners) who all work together to provide you with the care you need, when you need it.  Your next appointment:   12 month(s)  Provider:   Annabella Scarce, MD, Rosaline Bane, NP, or Reche Finder, NP     We recommend signing up for the patient portal called MyChart.  Sign up information is provided on this After Visit Summary.  MyChart is used to connect with patients for Virtual Visits (Telemedicine).  Patients are able to view lab/test results, encounter notes, upcoming appointments, etc.  Non-urgent messages can be sent to your provider as well.   To learn more about what you can do with MyChart, go to [email or web address].   Other Instructions WatchPAT?  Is a FDA cleared portable home sleep study test that uses a watch and 3 points of contact to monitor 7 different channels, including your heart rate, oxygen saturations, body position, snoring, and chest motion.  The study is easy to use from the comfort of your own home and accurately detect sleep apnea.  Before bed, you attach the chest  sensor, attached the sleep apnea bracelet to your nondominant hand, and attach the finger probe.  After the study, the raw data is downloaded from the watch and scored for apnea events.   For more information: [email or web address]  Patient Testing Instructions:  Do not put battery into the device until bedtime when you are ready to begin the test. Please call the support number if you need assistance after following the instructions below: 24 hour support line- [phone] or [facility] support at [phone] (option 2)  Download the [facility] WatchPAT One app through the google play store or App Store  Be sure to turn on or enable access to bluetooth in settlings on your smartphone/ device  Make sure no other bluetooth devices are on and within the vicinity of your smartphone/ device and WatchPAT watch during testing.  Make sure to leave your smart phone/ device plugged in and charging all night.  When ready for bed:  Follow the instructions step by step in the WatchPAT One App to activate the testing device. For additional instructions, including video instruction, visit the WatchPAT One video on Youtube. You can search for WatchPat One within Youtube (video is 4 minutes and 18 seconds) or enter: https://youtube/[email or web address]?[email or web address]_vbiwxE Please note: You will be prompted to enter a Pin to connect via bluetooth when starting the test. The PIN will be assigned to you when you receive the test.  The device is disposable, but it recommended that you retain the device until you receive a call letting you know the study  has been received and the results have been interpreted.  We will let you know if the study did not transmit to us  properly after the test is completed. You do not need to call us  to confirm the receipt of the test.  Please complete the test within 48 hours of receiving PIN.   Frequently Asked Questions:  What is Watch Bruna one?  A single use fully disposable home sleep apnea  testing device and will not need to be returned after completion.  What are the requirements to use WatchPAT one?  The be able to have a successful watchpat one sleep study, you should have your Watch pat one device, your smart phone, watch pat one app, your PIN number and Internet access What type of phone do I need?  You should have a smart phone that uses Android 5.1 and above or any Iphone with IOS 10 and above How can I download the WatchPAT one app?  Based on your device type search for WatchPAT one app either in google play for android devices or APP store for Iphone's Where will I get my PIN for the study?  Your PIN will be provided by your physician's office. It is used for authentication and if you lose/forget your PIN, please reach out to your providers office.  I do not have Internet at home. Can I do WatchPAT one study?  WatchPAT One needs Internet connection throughout the night to be able to transmit the sleep data. You can use your home/local internet or your cellular's data package. However, it is always recommended to use home/local Internet. It is estimated that between 20MB-30MB will be used with each study.However, the application will be looking for space in the phone to start the study.  What happens if I lose internet or bluetooth connection?  During the internet disconnection, your phone will not be able to transmit the sleep data. All the data, will be stored in your phone. As soon as the internet connection is back on, the phone will being sending the sleep data. During the bluetooth disconnection, WatchPAT one will not be able to to send the sleep data to your phone. Data will be kept in the WatchPAT one until two devices have bluetooth connection back on. As soon as the connection is back on, WatchPAT one will send the sleep data to the phone.  How long do I need to wear the WatchPAT one?  After you start the study, you should wear the device at least 6 hours.  How  far should I keep my phone from the device?  During the night, your phone should be within 15 feet.  What happens if I leave the room for restroom or other reasons?  Leaving the room for any reason will not cause any problem. As soon as your get back to the room, both devices will reconnect and will continue to send the sleep data. Can I use my phone during the sleep study?  Yes, you can use your phone as usual during the study. But it is recommended to put your watchpat one on when you are ready to go to bed.  How will I get my study results?  A soon as you completed your study, your sleep data will be sent to the provider. They will then share the results with you when they are ready.

## 2024-01-17 NOTE — Progress Notes (Signed)
 Cardiology Office Note:  .   Date:  01/17/2024  ID:  Christine Wilson, DOB 10/29/1960, MRN [id number] PCP: Katheen Roselie Rockford, NP  [facility] Providers Cardiologist:  Annabella Scarce, MD    History of Present Illness: .    Christine Wilson is a 63 y.o. 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  She developed acute systolic and diastolic heart failure with LVEF 15 to 20% in November 2017 felt to be hypertensive cardiomyopathy with BP at that time 205/120.  CTA chest negative for PE.  R/LHC moderate nonobstructive CAD with 60% LAD lesion.  Cardiac MRI with diffuse hypokinesis, LVEF 22%, normal RV function.  Entresto , spironolactone , digoxin  initiated.  Repeat echo 08/2018 normalization LVEF 60 to 65% with grade 1 diastolic dysfunction.  She had a repeat echo 08/28/2021 which revealed normal LVEF, trivial MR, moderate TR. she saw Reche Finder 06/2023 and was doing well.  Carvedilol  was restarted.   Discussed the use of AI scribe software for clinical note transcription with the patient, who gave verbal consent to proceed.  History of Present Illness [name] experiences intermittent leg cramps over the past few months, sometimes occurring during sleep. The cramps are described as annoying but not frequent, and she finds relief by consuming mustard. She is uncertain if hydration plays a role in the occurrence of these cramps.  She has a history of coronary artery disease with previous heart tests indicating blockages up to 60%. She was on atorvastatin , which she discontinued, resulting in an increase in her LDL cholesterol from 69 to 141 mg/dL. Her current medications include amlodipine , carvedilol , hydralazine , Entresto , and baby aspirin .  She engages in physical activity through walking and yard work, although not on a regular schedule. She feels good during these activities without  experiencing shortness of breath, swelling, or difficulty breathing.  She experiences occasional neck spasms, which she manages by working them out. The cause is unknown, and they are not frequent.  She reports sleep disturbances, feeling unrested despite sleeping, and has been told she snores. She does not fall asleep easily during the day and does not take naps.   ROS:  As per HPI  Studies Reviewed: .       Echo 11/15/21: 1. Left ventricular ejection fraction, by estimation, is 60 to 65%. The  left ventricle has normal function. The left ventricle has no regional  wall motion abnormalities. Left ventricular diastolic parameters were  normal.   2. Right ventricular systolic function is normal. The right ventricular  size is normal. There is normal pulmonary artery systolic pressure. The  estimated right ventricular systolic pressure is 31.9 mmHg.   3. The mitral valve is normal in structure. Trivial mitral valve  regurgitation. No evidence of mitral stenosis.   4. Tricuspid valve regurgitation is moderate.   5. The aortic valve is tricuspid. There is mild thickening of the aortic  valve. Aortic valve regurgitation is not visualized. Aortic valve  sclerosis is present, with no evidence of aortic valve stenosis.   6. The inferior vena cava is normal in size with greater than 50%  respiratory variability, suggesting right atrial pressure of 3 mmHg.   Risk Assessment/Calculations:         STOP-Bang Score:  4      Physical Exam:   VS:  BP 130/72 (BP Location: Right Arm, Patient Position: Sitting, Cuff Size: Normal)   Pulse 72   Ht 5' 1 (1.549  m)   Wt 159 lb (72.1 kg)   SpO2 98%   BMI 30.04 kg/m  , BMI Body mass index is 30.04 kg/m. GENERAL:  Well appearing HEENT: Pupils equal round and reactive, fundi not visualized, oral mucosa unremarkable NECK:  No jugular venous distention, waveform within normal limits, carotid upstroke brisk and symmetric, no bruits, no  thyromegaly LUNGS:  Clear to auscultation bilaterally HEART:  RRR.  PMI not displaced or sustained,S1 and S2 within normal limits, no S3, no S4, no clicks, no rubs, no murmurs ABD:  Flat, positive bowel sounds normal in frequency in pitch, no bruits, no rebound, no guarding, no midline pulsatile mass, no hepatomegaly, no splenomegaly EXT:  2 plus pulses throughout, no edema, no cyanosis no clubbing SKIN:  No rashes no nodules NEURO:  Cranial nerves II through XII grossly intact, motor grossly intact throughout PSYCH:  Cognitively intact, oriented to person place and time   ASSESSMENT AND PLAN: .    Assessment & Plan # Coronary Artery Disease Coronary artery disease with previous tests showing blockages up to 60%. - Restart atorvastatin  prescription. - Encourage lifestyle modifications including increased physical activity. -Continue aspirin  and carvedilol .  # Hyperlipidemia Significantly elevated LDL levels (141 mg/dL) in March compared to previous levels (69 mg/dL) when on atorvastatin . Discontinuation of atorvastatin  likely contributed to increased LDL levels. - Restart atorvastatin  prescription. - Plan for a fasting lipid panel and comprehensive metabolic panel at the end of September.  # HFimpEF:  Heart failure is well-managed with current medications. No symptoms of dyspnea or edema. - Continue current medications: amlodipine , carvedilol , hydralazine , and Entresto .  # Hypertension Hypertension is well-controlled with current medication regimen. Blood pressure readings at home are stable. - Continue current medications: amlodipine , carvedilol , hydralazine , and Entresto . - Encourage regular monitoring of blood pressure at home.  # Leg Cramps Intermittent leg cramps over the past few months. Electrolyte imbalances are a potential cause. - Order basic metabolic panel and magnesium level. - Advise increased hydration.  # Neck Spasms Intermittent neck spasms possibly related to  musculoskeletal or nerve issues. Electrolyte imbalances will be evaluated as a potential cause. - Order basic metabolic panel and magnesium level.  # Sleep Disturbance Non-restorative sleep and possible snoring, suggesting potential sleep apnea. No previous sleep study conducted. - Order home sleep study.   Dispo: f/u 1 year  Signed, Annabella Scarce, MD

## 2024-01-21 ENCOUNTER — Telehealth: Payer: Self-pay

## 2024-01-21 NOTE — Telephone Encounter (Addendum)
**Note De-Identified Eileene Kisling Obfuscation** Ordering provider: Dr Raford Associated diagnoses: Snoring-R06.83 and Somnolence-R40.0  WatchPAT PA obtained on 01/21/2024 by Atharva Mirsky, Avelina HERO, LPN. Authorization: Per the [employer]  HMO Connect Website: [employer] [facility] CONNECT does require prior authorization for CPT Code: 04199 ([facility]-HST).  Patient notified of PIN (1234) on 01/21/2024 Liyanna Cartwright Notification Method: phone.  Phone note routed to covering staff for follow-up.

## 2024-02-19 ENCOUNTER — Telehealth (HOSPITAL_BASED_OUTPATIENT_CLINIC_OR_DEPARTMENT_OTHER): Payer: Self-pay | Admitting: Cardiovascular Disease

## 2024-02-19 NOTE — Telephone Encounter (Signed)
 Patient calling the office for samples of medication:   1.  What medication and dosage are you requesting samples for?  sacubitril -valsartan  (ENTRESTO ) 97-103 MG   2.  Are you currently out of this medication?   Patient stated she is completely out of this medication.

## 2024-02-19 NOTE — Telephone Encounter (Signed)
 Spoke with patient who stated her Entresto  will be over $300 this month and $600 next month. Stated she was paying $10 a month and can not afford the new price.  Is completely out of the medication, explained do not sample the Entresto  97-103 mg  Called pharmacy and they are billing same way even with copay card  Discussed with patient who reached out to Entresto  and was told she had to meet deductible with her insurance She will look into patient assistance but does not know if she will qualify  Will forward to Reche ORN NP for review

## 2024-02-19 NOTE — Telephone Encounter (Signed)
 Patient stated she received her sleep study and has not opened the box as yet.  Patient wants advice on next steps.

## 2024-02-20 NOTE — Telephone Encounter (Signed)
 She should be able to enroll in copay card online to get $ 10 price. Or call [phone]. Likely her copay card expired is why price jumped.   [email or web address]  If she for whatever reason cannot get entresto  copay card would instead change to Valsartan  160mg  daily.  Canyon Lohr S Tovah Slavick, NP

## 2024-02-20 NOTE — Telephone Encounter (Signed)
 Called patient, spoke with patient in regards to recommendations. Patient aware and asked that I leave her a voicemail with the number below as she is going to call, she requested a new copay card and received one but it wasn't working- she will call them and see if they can be of assistance, and if not she was advised to call us  back so we can look at other options.  Patient verbalized understanding.

## 2024-02-20 NOTE — Telephone Encounter (Signed)
 Per Macario, Hello Ms Christine Wilson, I did a prior authorization for the [facility] One-Home Sleep Study that Dr Raford ordered and [employer] does not require a prior authorization for this type of home sleep study so it is ok for you to proceed with your home sleep test.

## 2024-05-05 ENCOUNTER — Other Ambulatory Visit (HOSPITAL_BASED_OUTPATIENT_CLINIC_OR_DEPARTMENT_OTHER): Payer: Self-pay | Admitting: Family

## 2024-05-05 DIAGNOSIS — I10 Essential (primary) hypertension: Secondary | ICD-10-CM

## 2024-05-05 DIAGNOSIS — I5042 Chronic combined systolic (congestive) and diastolic (congestive) heart failure: Secondary | ICD-10-CM

## 2024-05-15 ENCOUNTER — Telehealth: Payer: Self-pay | Admitting: Cardiovascular Disease

## 2024-05-15 ENCOUNTER — Other Ambulatory Visit (HOSPITAL_COMMUNITY): Payer: Self-pay

## 2024-05-15 DIAGNOSIS — I5042 Chronic combined systolic (congestive) and diastolic (congestive) heart failure: Secondary | ICD-10-CM

## 2024-05-15 DIAGNOSIS — I10 Essential (primary) hypertension: Secondary | ICD-10-CM

## 2024-05-15 MED ORDER — SACUBITRIL-VALSARTAN 97-103 MG PO TABS
1.0000 | ORAL_TABLET | Freq: Two times a day (BID) | ORAL | 1 refills | Status: DC
  Filled 2024-05-15: qty 180, 90d supply, fill #0

## 2024-05-15 NOTE — Telephone Encounter (Signed)
 Prior auth for entresto , please.  TY!  Jaivian Battaglini S Chrisotpher Rivero, NP

## 2024-05-15 NOTE — Telephone Encounter (Signed)
 Pharmacy Patient Advocate Encounter   Received notification from Physician's Office that prior authorization for ENTRESTO  is required/requested.   Insurance verification completed.     Per test claim: The current 30 day co-pay is, $0.  No PA needed at this time. This test claim was processed through [facility]- copay amounts may vary at other pharmacies due to pharmacy/plan contracts, or as the patient moves through the different stages of their insurance plan.    Plan covers generic Entresto  w/o PA

## 2024-05-15 NOTE — Telephone Encounter (Signed)
 Called [facility]. Needed generic Rx, updated Rx sent.   Called Miss Kaser, left detailed VM per DPR.  Kimi Bordeau S Katelan Hirt, NP

## 2024-05-15 NOTE — Telephone Encounter (Signed)
 Pt c/o medication issue:  1. Name of Medication: sacubitril -valsartan  (ENTRESTO ) 97-103 MG   2. How are you currently taking this medication (dosage and times per day)? As written  3. Are you having a reaction (difficulty breathing--STAT)? No  4. What is your medication issue? Pts pharmacy requesting prior Auth

## 2024-05-18 ENCOUNTER — Other Ambulatory Visit: Payer: Self-pay | Admitting: Nurse Practitioner

## 2024-05-18 ENCOUNTER — Other Ambulatory Visit (HOSPITAL_BASED_OUTPATIENT_CLINIC_OR_DEPARTMENT_OTHER): Payer: Self-pay | Admitting: Cardiovascular Disease

## 2024-05-18 DIAGNOSIS — E559 Vitamin D deficiency, unspecified: Secondary | ICD-10-CM

## 2024-05-18 DIAGNOSIS — E1169 Type 2 diabetes mellitus with other specified complication: Secondary | ICD-10-CM

## 2024-05-18 DIAGNOSIS — I25118 Atherosclerotic heart disease of native coronary artery with other forms of angina pectoris: Secondary | ICD-10-CM

## 2024-05-19 ENCOUNTER — Other Ambulatory Visit (HOSPITAL_COMMUNITY): Payer: Self-pay

## 2024-07-24 ENCOUNTER — Other Ambulatory Visit (HOSPITAL_BASED_OUTPATIENT_CLINIC_OR_DEPARTMENT_OTHER): Payer: Self-pay | Admitting: Family

## 2024-07-24 DIAGNOSIS — I5042 Chronic combined systolic (congestive) and diastolic (congestive) heart failure: Secondary | ICD-10-CM

## 2024-07-27 MED ORDER — SACUBITRIL-VALSARTAN 97-103 MG PO TABS: 1.0000 | ORAL_TABLET | Freq: Two times a day (BID) | ORAL | 1 refills | Status: AC

## 2024-07-27 NOTE — Addendum Note (Signed)
 Addended by: BLUFORD, Kaisyn Reinhold L on: 07/27/2024 11:39 AM   Modules accepted: Orders

## 2024-08-04 ENCOUNTER — Other Ambulatory Visit (HOSPITAL_BASED_OUTPATIENT_CLINIC_OR_DEPARTMENT_OTHER): Payer: Self-pay | Admitting: Family

## 2024-08-04 DIAGNOSIS — I5042 Chronic combined systolic (congestive) and diastolic (congestive) heart failure: Secondary | ICD-10-CM

## 2024-08-18 ENCOUNTER — Other Ambulatory Visit (HOSPITAL_BASED_OUTPATIENT_CLINIC_OR_DEPARTMENT_OTHER): Payer: Self-pay | Admitting: Family

## 2024-08-18 DIAGNOSIS — I5042 Chronic combined systolic (congestive) and diastolic (congestive) heart failure: Secondary | ICD-10-CM

## 2024-08-20 NOTE — Telephone Encounter (Signed)
 Patient need make an appointment for refills 1st attempt. Thank
